# Patient Record
Sex: Male | Born: 1955 | Race: White | Hispanic: No | Smoking: Current every day smoker
Health system: Southern US, Community
[De-identification: ages and names within clinical notes are randomized; demographics above are authoritative.]

## PROBLEM LIST (undated history)

## (undated) DIAGNOSIS — E119 Type 2 diabetes mellitus without complications: Secondary | ICD-10-CM

## (undated) DIAGNOSIS — H269 Unspecified cataract: Secondary | ICD-10-CM

## (undated) DIAGNOSIS — C801 Malignant (primary) neoplasm, unspecified: Secondary | ICD-10-CM

## (undated) HISTORY — PX: MOHS SURGERY: SUR867

## (undated) HISTORY — PX: TONSILLECTOMY: SHX5217

## (undated) HISTORY — DX: Unspecified cataract: H26.9

## (undated) HISTORY — DX: Malignant (primary) neoplasm, unspecified: C80.1

## (undated) HISTORY — PX: APPENDECTOMY: SHX54

## (undated) HISTORY — DX: Type 2 diabetes mellitus without complications: E11.9

---

## 2001-12-01 ENCOUNTER — Encounter: Payer: Self-pay | Admitting: Emergency Medicine

## 2001-12-01 ENCOUNTER — Emergency Department (HOSPITAL_COMMUNITY): Admission: EM | Admit: 2001-12-01 | Discharge: 2001-12-01 | Payer: Self-pay | Admitting: Emergency Medicine

## 2019-11-20 ENCOUNTER — Other Ambulatory Visit: Payer: Self-pay

## 2019-11-21 ENCOUNTER — Encounter: Payer: Self-pay | Admitting: Family Medicine

## 2019-11-21 ENCOUNTER — Ambulatory Visit (INDEPENDENT_AMBULATORY_CARE_PROVIDER_SITE_OTHER): Payer: No Typology Code available for payment source | Admitting: Family Medicine

## 2019-11-21 VITALS — BP 146/86 | HR 94 | Temp 98.0°F | Ht 68.0 in | Wt 202.0 lb

## 2019-11-21 DIAGNOSIS — Z7689 Persons encountering health services in other specified circumstances: Secondary | ICD-10-CM | POA: Diagnosis not present

## 2019-11-21 DIAGNOSIS — R972 Elevated prostate specific antigen [PSA]: Secondary | ICD-10-CM

## 2019-11-21 DIAGNOSIS — R7301 Impaired fasting glucose: Secondary | ICD-10-CM

## 2019-11-21 DIAGNOSIS — E785 Hyperlipidemia, unspecified: Secondary | ICD-10-CM

## 2019-11-21 DIAGNOSIS — Z1283 Encounter for screening for malignant neoplasm of skin: Secondary | ICD-10-CM

## 2019-11-21 DIAGNOSIS — I10 Essential (primary) hypertension: Secondary | ICD-10-CM

## 2019-11-21 NOTE — Progress Notes (Signed)
Jesus Davenport is a 64 y.o. male  Chief Complaint  Patient presents with  . New Patient (Initial Visit)    Patient is here today to establish care. Had Colonoscopy 13-years-ago WNL and would like to do a Cologuard instead if possible. He has had 2 coffees with cream and sugar this am.  Does not need refills yet. Is a widower of 5 years. Brother died last week and sister died just before that. He is burying them this week.    HPI: Jesus Davenport is a 64 y.o. male here as a new patient to establish care with our office.  He has HTN, hypercholesterolemia, ? DM, elevated PSA. He was seeing PCP q65mo - [facility] Dr. Sherlene Shams  Last CPE, labs: about 6 mo ago  Last colonoscopy: 13 years ago, normal and due in 10 years; unsure if he wants to proceed with colonoscopy vs cologuard  Med refills needed today: none   History reviewed. No pertinent past medical history.  History reviewed. No pertinent surgical history.  Social History   Socioeconomic History  . Marital status: Widowed    Spouse name: Not on file  . Number of children: Not on file  . Years of education: Not on file  . Highest education level: Not on file  Occupational History  . Not on file  Tobacco Use  . Smoking status: Current Every Day Smoker    Types: Cigarettes  . Smokeless tobacco: Never Used  Substance and Sexual Activity  . Alcohol use: Yes    Comment: Occas  . Drug use: Never  . Sexual activity: Not Currently  Other Topics Concern  . Not on file  Social History Narrative  . Not on file   Social Determinants of Health   Financial Resource Strain:   . Difficulty of Paying Living Expenses:   Food Insecurity:   . Worried About Charity fundraiser in the Last Year:   . Arboriculturist in the Last Year:   Transportation Needs:   . Film/video editor (Medical):   Marland Kitchen Lack of Transportation (Non-Medical):   Physical Activity:   . Days of Exercise per Week:   . Minutes of Exercise  per Session:   Stress:   . Feeling of Stress :   Social Connections:   . Frequency of Communication with Friends and Family:   . Frequency of Social Gatherings with Friends and Family:   . Attends Religious Services:   . Active Member of Clubs or Organizations:   . Attends Archivist Meetings:   Marland Kitchen Marital Status:   Intimate Partner Violence:   . Fear of Current or Ex-Partner:   . Emotionally Abused:   Marland Kitchen Physically Abused:   . Sexually Abused:     History reviewed. No pertinent family history.   Immunization History  Administered Date(s) Administered  . Moderna SARS-COVID-2 Vaccination 09/26/2019, 10/25/2019    Outpatient Encounter Medications as of 11/21/2019  Medication Sig  . amLODipine (NORVASC) 5 MG tablet Take 5 mg by mouth daily.  Marland Kitchen atorvastatin (LIPITOR) 20 MG tablet Take 1 tablet by mouth daily.  Marland Kitchen dutasteride (AVODART) 0.5 MG capsule Take 1 capsule by mouth daily.  . hydrochlorothiazide (HYDRODIURIL) 25 MG tablet Take 1 tablet by mouth daily.  Marland Kitchen lisinopril (ZESTRIL) 40 MG tablet Take 1 tablet by mouth daily.   No facility-administered encounter medications on file as of 11/21/2019.     ROS: Gen: no fever, chills  Skin: no  rash, itching ENT: no ear pain, ear drainage, nasal congestion, rhinorrhea, sinus pressure, sore throat Resp: no cough, wheeze,SOB CV: no CP, palpitations, LE edema,  GI: no heartburn, n/v/d/c, abd pain GU: no dysuria, urgency, frequency, hematuria  MSK: no joint pain, myalgias, back pain Neuro: no dizziness, headache, weakness    No Known Allergies  BP (!) 146/86 (BP Location: Left Arm, Patient Position: Sitting, Cuff Size: Normal)   Pulse 94   Temp 98 F (36.7 C) (Oral)   Ht 5\' 8"  (1.727 m)   Wt 202 lb (91.6 kg)   SpO2 99%   BMI 30.71 kg/m    BP Readings from Last 3 Encounters:  11/21/19 (!) 146/86     Physical Exam  Constitutional: He is oriented to person, place, and time. He appears well-developed and  well-nourished. No distress.  Cardiovascular: Normal rate, regular rhythm and normal heart sounds.  Pulmonary/Chest: Effort normal and breath sounds normal. No respiratory distress. He has no wheezes.  Musculoskeletal:        General: No edema.  Neurological: He is alert and oriented to person, place, and time.     A/P:  1. Encounter to establish care with new doctor - due for labs - will RTO for fasting lab appt and shingrix #1 - due for CRC screening, pt will send MyChart message next week to let me know if he decides on colonoscopy vs cologuard - obtain records from previous PCP - see HPI  2. Essential hypertension - SBP slightly above goal today, will follow - cont current meds, low sodium diet, regular CV exercise - Basic metabolic panel; Future  3. Elevated PSA - PSA; Future  4. Hyperlipidemia, unspecified hyperlipidemia type - stable - cont statin - Lipid panel; Future - ALT; Future - AST; Future  5. IFG (impaired fasting glucose) - Hemoglobin A1c; Future - Microalbumin / creatinine urine ratio; Future  6. Screening for skin cancer - Ambulatory referral to Dermatology   This visit occurred during the SARS-CoV-2 public health emergency.  Safety protocols were in place, including screening questions prior to the visit, additional usage of staff PPE, and extensive cleaning of exam room while observing appropriate contact time as indicated for disinfecting solutions.

## 2019-11-22 ENCOUNTER — Encounter: Payer: Self-pay | Admitting: Family Medicine

## 2019-11-22 DIAGNOSIS — I10 Essential (primary) hypertension: Secondary | ICD-10-CM

## 2019-11-22 DIAGNOSIS — N400 Enlarged prostate without lower urinary tract symptoms: Secondary | ICD-10-CM | POA: Insufficient documentation

## 2019-11-22 DIAGNOSIS — C44622 Squamous cell carcinoma of skin of right upper limb, including shoulder: Secondary | ICD-10-CM | POA: Insufficient documentation

## 2019-11-22 DIAGNOSIS — M5136 Other intervertebral disc degeneration, lumbar region: Secondary | ICD-10-CM | POA: Insufficient documentation

## 2019-11-22 DIAGNOSIS — M47812 Spondylosis without myelopathy or radiculopathy, cervical region: Secondary | ICD-10-CM | POA: Insufficient documentation

## 2019-11-22 DIAGNOSIS — M51369 Other intervertebral disc degeneration, lumbar region without mention of lumbar back pain or lower extremity pain: Secondary | ICD-10-CM | POA: Insufficient documentation

## 2019-11-22 DIAGNOSIS — F17200 Nicotine dependence, unspecified, uncomplicated: Secondary | ICD-10-CM | POA: Insufficient documentation

## 2019-11-22 DIAGNOSIS — E1169 Type 2 diabetes mellitus with other specified complication: Secondary | ICD-10-CM | POA: Insufficient documentation

## 2019-11-22 HISTORY — DX: Essential (primary) hypertension: I10

## 2019-11-27 ENCOUNTER — Other Ambulatory Visit: Payer: No Typology Code available for payment source

## 2019-11-27 ENCOUNTER — Ambulatory Visit: Payer: No Typology Code available for payment source

## 2019-11-28 ENCOUNTER — Other Ambulatory Visit: Payer: Self-pay

## 2019-11-29 ENCOUNTER — Ambulatory Visit (INDEPENDENT_AMBULATORY_CARE_PROVIDER_SITE_OTHER): Payer: No Typology Code available for payment source

## 2019-11-29 ENCOUNTER — Other Ambulatory Visit (INDEPENDENT_AMBULATORY_CARE_PROVIDER_SITE_OTHER): Payer: No Typology Code available for payment source

## 2019-11-29 DIAGNOSIS — I10 Essential (primary) hypertension: Secondary | ICD-10-CM

## 2019-11-29 DIAGNOSIS — R7301 Impaired fasting glucose: Secondary | ICD-10-CM

## 2019-11-29 DIAGNOSIS — Z23 Encounter for immunization: Secondary | ICD-10-CM

## 2019-11-29 DIAGNOSIS — E785 Hyperlipidemia, unspecified: Secondary | ICD-10-CM

## 2019-11-29 DIAGNOSIS — R972 Elevated prostate specific antigen [PSA]: Secondary | ICD-10-CM

## 2019-11-29 LAB — BASIC METABOLIC PANEL
BUN: 9 mg/dL (ref 6–23)
CO2: 29 mEq/L (ref 19–32)
Calcium: 9 mg/dL (ref 8.4–10.5)
Chloride: 99 mEq/L (ref 96–112)
Creatinine, Ser: 0.8 mg/dL (ref 0.40–1.50)
GFR: 97.06 mL/min (ref >60.00)
Glucose, Bld: 145 mg/dL — ABNORMAL HIGH (ref 70–99)
Potassium: 3.8 mEq/L (ref 3.5–5.1)
Sodium: 132 mEq/L — ABNORMAL LOW (ref 135–145)

## 2019-11-29 LAB — MICROALBUMIN / CREATININE URINE RATIO
Creatinine,U: 22.5 mg/dL
Microalb Creat Ratio: 3.1 mg/g (ref 0.0–30.0)
Microalb, Ur: <0.7 mg/dL (ref 0.0–1.9)

## 2019-11-29 LAB — HEMOGLOBIN A1C: Hgb A1c MFr Bld: 7.1 % — ABNORMAL HIGH (ref 4.6–6.5)

## 2019-11-29 LAB — LIPID PANEL
Cholesterol: 152 mg/dL (ref 0–200)
HDL: 37.9 mg/dL — ABNORMAL LOW (ref >39.00)
NonHDL: 114.46
Total CHOL/HDL Ratio: 4
Triglycerides: 383 mg/dL — ABNORMAL HIGH (ref 0.0–149.0)
VLDL: 76.6 mg/dL — ABNORMAL HIGH (ref 0.0–40.0)

## 2019-11-29 LAB — PSA: PSA: 2.32 ng/mL (ref 0.10–4.00)

## 2019-11-29 LAB — LDL CHOLESTEROL, DIRECT: Direct LDL: 83 mg/dL

## 2019-11-29 LAB — ALT: ALT: 17 U/L (ref 0–53)

## 2019-11-29 LAB — AST: AST: 16 U/L (ref 0–37)

## 2019-11-29 NOTE — Progress Notes (Signed)
After obtaining consent, and per orders of Dr. Bryan Lemma, injection of Shingrix #1 given left deltoid by Akaysha Cobern Berneta Sages. Patient instructed to remain in clinic for 20 minutes afterwards, and to report any adverse reaction to me immediately.

## 2019-11-30 ENCOUNTER — Other Ambulatory Visit: Payer: No Typology Code available for payment source

## 2019-12-06 ENCOUNTER — Encounter: Payer: Self-pay | Admitting: Family Medicine

## 2019-12-07 NOTE — Telephone Encounter (Signed)
Please see message . Thank you .

## 2020-01-02 ENCOUNTER — Encounter: Payer: Self-pay | Admitting: Family Medicine

## 2020-01-02 NOTE — Telephone Encounter (Signed)
Please see message and advise.  Thank you. ° °

## 2020-01-03 ENCOUNTER — Encounter: Payer: Self-pay | Admitting: Nurse Practitioner

## 2020-01-03 ENCOUNTER — Other Ambulatory Visit: Payer: Self-pay

## 2020-01-03 ENCOUNTER — Ambulatory Visit (INDEPENDENT_AMBULATORY_CARE_PROVIDER_SITE_OTHER): Payer: Medicare Other | Admitting: Nurse Practitioner

## 2020-01-03 VITALS — BP 138/82 | HR 86 | Temp 97.6°F | Ht 68.0 in | Wt 206.2 lb

## 2020-01-03 DIAGNOSIS — M5441 Lumbago with sciatica, right side: Secondary | ICD-10-CM

## 2020-01-03 MED ORDER — PREDNISONE 20 MG PO TABS: ORAL_TABLET | ORAL | 0 refills | Status: AC

## 2020-01-03 MED ORDER — CYCLOBENZAPRINE HCL 5 MG PO TABS: 5.0000 mg | ORAL_TABLET | Freq: Every day | ORAL | 0 refills | Status: DC

## 2020-01-03 MED ORDER — KETOROLAC TROMETHAMINE 30 MG/ML IJ SOLN
30.0000 mg | Freq: Once | INTRAMUSCULAR | Status: AC
  Administered 2020-01-03: 30 mg via INTRAMUSCULAR

## 2020-01-03 MED ORDER — ACETAMINOPHEN 500 MG PO TABS: 500.0000 mg | ORAL_TABLET | Freq: Three times a day (TID) | ORAL | 0 refills | Status: DC | PRN

## 2020-01-03 NOTE — Progress Notes (Signed)
Subjective:  Patient ID: Jesus Davenport, male    DOB: 1956/07/02  Age: 64 y.o. MRN: [id number]  CC: Back Pain (right lower back hurts//pt stated he is traveling more with his job and he got home last Thursday and Friday an Sat he noticed back pain//this morning couldn't get out of the bed//pt took ibuprofen and didn't seem to help any)  Back Pain This is a recurrent problem. The current episode started in the past 7 days. The problem occurs constantly. The problem has been gradually worsening since onset. The pain is present in the lumbar spine and gluteal. The quality of the pain is described as aching and cramping. The pain radiates to the right thigh. The pain is moderate. The pain is worse during the day. The symptoms are aggravated by bending, sitting and twisting. Stiffness is present in the morning. Pertinent negatives include no abdominal pain, bladder incontinence, bowel incontinence, dysuria, numbness, paresis, paresthesias, perianal numbness, tingling or weakness. Risk factors include lack of exercise, obesity, poor posture and sedentary lifestyle. He has tried NSAIDs for the symptoms. The treatment provided no relief.   Reviewed past Medical, Social and Family history today.  Outpatient Medications Prior to Visit  Medication Sig Dispense Refill  . amLODipine (NORVASC) 5 MG tablet Take 5 mg by mouth daily.    Marland Kitchen atorvastatin (LIPITOR) 20 MG tablet Take 1 tablet by mouth daily.    . hydrochlorothiazide (HYDRODIURIL) 25 MG tablet Take 1 tablet by mouth daily.    Marland Kitchen lisinopril (ZESTRIL) 40 MG tablet Take 1 tablet by mouth daily.    Marland Kitchen dutasteride (AVODART) 0.5 MG capsule Take 1 capsule by mouth daily.     No facility-administered medications prior to visit.    ROS See HPI  Objective:  BP 138/82   Pulse 86   Temp 97.6 F (36.4 C) (Tympanic)   Ht 5\' 8"  (1.727 m)   Wt 206 lb 3.2 oz (93.5 kg)   SpO2 95%   BMI 31.35 kg/m   BP Readings from Last 3 Encounters:  01/03/20 138/82   11/21/19 (!) 146/86    Wt Readings from Last 3 Encounters:  01/03/20 206 lb 3.2 oz (93.5 kg)  11/21/19 202 lb (91.6 kg)    Physical Exam Vitals reviewed.  Cardiovascular:     Rate and Rhythm: Normal rate.     Pulses: Normal pulses.  Pulmonary:     Effort: Pulmonary effort is normal.  Abdominal:     Palpations: Abdomen is soft.     Tenderness: There is no abdominal tenderness. There is no right CVA tenderness, left CVA tenderness or guarding.  Musculoskeletal:        General: Normal range of motion.     Right hip: Normal.     Left hip: Normal.     Right upper leg: Normal.     Left upper leg: Normal.     Right knee: Normal.     Left knee: Normal.     Right lower leg: Normal. No edema.     Left lower leg: Normal. No edema.     Comments: negative SLR  Neurological:     Mental Status: He is alert and oriented to person, place, and time.    Lab Results  Component Value Date   GLUCOSE 145 (H) 11/29/2019   CHOL 152 11/29/2019   TRIG 383.0 (H) 11/29/2019   HDL 37.90 (L) 11/29/2019   LDLDIRECT 83.0 11/29/2019   ALT 17 11/29/2019   AST 16 11/29/2019  NA 132 (L) 11/29/2019   K 3.8 11/29/2019   CL 99 11/29/2019   CREATININE 0.80 11/29/2019   BUN 9 11/29/2019   CO2 29 11/29/2019   PSA 2.32 11/29/2019   HGBA1C 7.1 (H) 11/29/2019   MICROALBUR <0.7 11/29/2019   Assessment & Plan:  This visit occurred during the SARS-CoV-2 public health emergency.  Safety protocols were in place, including screening questions prior to the visit, additional usage of staff PPE, and extensive cleaning of exam room while observing appropriate contact time as indicated for disinfecting solutions.   Daeron was seen today for back pain.  Diagnoses and all orders for this visit:  Acute right-sided low back pain with right-sided sciatica -     ketorolac (TORADOL) 30 MG/ML injection 30 mg -     predniSONE (DELTASONE) 20 MG tablet; Take 2 tablets (40 mg total) by mouth daily with breakfast for 1  day, THEN 1.5 tablets (30 mg total) daily with breakfast for 1 day, THEN 1 tablet (20 mg total) daily with breakfast for 1 day, THEN 0.5 tablets (10 mg total) daily with breakfast for 1 day. -     cyclobenzaprine (FLEXERIL) 5 MG tablet; Take 1-2 tablets (5-10 mg total) by mouth at bedtime. -     acetaminophen (TYLENOL) 500 MG tablet; Take 1 tablet (500 mg total) by mouth every 8 (eight) hours as needed.   I am having Loys Shugars. Wojtas "Norman" start on predniSONE, cyclobenzaprine, and acetaminophen. I am also having him maintain his amLODipine, atorvastatin, dutasteride, hydrochlorothiazide, and lisinopril. We administered ketorolac.  Meds ordered this encounter  Medications  . ketorolac (TORADOL) 30 MG/ML injection 30 mg  . predniSONE (DELTASONE) 20 MG tablet    Sig: Take 2 tablets (40 mg total) by mouth daily with breakfast for 1 day, THEN 1.5 tablets (30 mg total) daily with breakfast for 1 day, THEN 1 tablet (20 mg total) daily with breakfast for 1 day, THEN 0.5 tablets (10 mg total) daily with breakfast for 1 day.    Dispense:  5 tablet    Refill:  0    Order Specific Question:   Supervising Provider    Answer:   Ronnald Nian [[id number]]  . cyclobenzaprine (FLEXERIL) 5 MG tablet    Sig: Take 1-2 tablets (5-10 mg total) by mouth at bedtime.    Dispense:  14 tablet    Refill:  0    Order Specific Question:   Supervising Provider    Answer:   Ronnald Nian [[id number]]  . acetaminophen (TYLENOL) 500 MG tablet    Sig: Take 1 tablet (500 mg total) by mouth every 8 (eight) hours as needed.    Dispense:  30 tablet    Refill:  0    Order Specific Question:   Supervising Provider    Answer:   Ronnald Nian [[id number]]    Problem List Items Addressed This Visit    None    Visit Diagnoses    Acute right-sided low back pain with right-sided sciatica    -  Primary   Relevant Medications   ketorolac (TORADOL) 30 MG/ML injection 30 mg (Completed)   predniSONE (DELTASONE) 20 MG tablet    cyclobenzaprine (FLEXERIL) 5 MG tablet   acetaminophen (TYLENOL) 500 MG tablet      Follow-up: No follow-ups on file.  [name], NP

## 2020-01-03 NOTE — Patient Instructions (Addendum)
Do not take flexeril and drive.  Acute Back Pain, Adult Acute back pain is sudden and usually short-lived. It is often caused by an injury to the muscles and tissues in the back. The injury may result from:  A muscle or ligament getting overstretched or torn (strained). Ligaments are tissues that connect bones to each other. Lifting something improperly can cause a back strain.  Wear and tear (degeneration) of the spinal disks. Spinal disks are circular tissue that provides cushioning between the bones of the spine (vertebrae).  Twisting motions, such as while playing sports or doing yard work.  A hit to the back.  Arthritis. You may have a physical exam, lab tests, and imaging tests to find the cause of your pain. Acute back pain usually goes away with rest and home care. Follow these instructions at home: Managing pain, stiffness, and swelling  Take over-the-counter and prescription medicines only as told by your health care provider.  Your health care provider may recommend applying ice during the first 24-48 hours after your pain starts. To do this: ? Put ice in a plastic bag. ? Place a towel between your skin and the bag. ? Leave the ice on for 20 minutes, 2-3 times a day.  If directed, apply heat to the affected area as often as told by your health care provider. Use the heat source that your health care provider recommends, such as a moist heat pack or a heating pad. ? Place a towel between your skin and the heat source. ? Leave the heat on for 20-30 minutes. ? Remove the heat if your skin turns bright red. This is especially important if you are unable to feel pain, heat, or cold. You have a greater risk of getting burned. Activity   Do not stay in bed. Staying in bed for more than 1-2 days can delay your recovery.  Sit up and stand up straight. Avoid leaning forward when you sit, or hunching over when you stand. ? If you work at a desk, sit close to it so you do not need  to lean over. Keep your chin tucked in. Keep your neck drawn back, and keep your elbows bent at a right angle. Your arms should look like the letter "L." ? Sit high and close to the steering wheel when you drive. Add lower back (lumbar) support to your car seat, if needed.  Take short walks on even surfaces as soon as you are able. Try to increase the length of time you walk each day.  Do not sit, drive, or stand in one place for more than 30 minutes at a time. Sitting or standing for long periods of time can put stress on your back.  Do not drive or use heavy machinery while taking prescription pain medicine.  Use proper lifting techniques. When you bend and lift, use positions that put less stress on your back: ? Fouke your knees. ? Keep the load close to your body. ? Avoid twisting.  Exercise regularly as told by your health care provider. Exercising helps your back heal faster and helps prevent back injuries by keeping muscles strong and flexible.  Work with a physical therapist to make a safe exercise program, as recommended by your health care provider. Do any exercises as told by your physical therapist. Lifestyle  Maintain a healthy weight. Extra weight puts stress on your back and makes it difficult to have good posture.  Avoid activities or situations that make you feel anxious  or stressed. Stress and anxiety increase muscle tension and can make back pain worse. Learn ways to manage anxiety and stress, such as through exercise. General instructions  Sleep on a firm mattress in a comfortable position. Try lying on your side with your knees slightly bent. If you lie on your back, put a pillow under your knees.  Follow your treatment plan as told by your health care provider. This may include: ? Cognitive or behavioral therapy. ? Acupuncture or massage therapy. ? Meditation or yoga. Contact a health care provider if:  You have pain that is not relieved with rest or  medicine.  You have increasing pain going down into your legs or buttocks.  Your pain does not improve after 2 weeks.  You have pain at night.  You lose weight without trying.  You have a fever or chills. Get help right away if:  You develop new bowel or bladder control problems.  You have unusual weakness or numbness in your arms or legs.  You develop nausea or vomiting.  You develop abdominal pain.  You feel faint. Summary  Acute back pain is sudden and usually short-lived.  Use proper lifting techniques. When you bend and lift, use positions that put less stress on your back.  Take over-the-counter and prescription medicines and apply heat or ice as directed by your health care provider. This information is not intended to replace advice given to you by your health care provider. Make sure you discuss any questions you have with your health care provider. Document Revised: 10/10/2018 Document Reviewed: 02/02/2017 Elsevier Patient Education  [place].

## 2020-02-18 ENCOUNTER — Encounter: Payer: Self-pay | Admitting: Nurse Practitioner

## 2020-02-18 ENCOUNTER — Other Ambulatory Visit: Payer: Self-pay

## 2020-02-18 ENCOUNTER — Ambulatory Visit (INDEPENDENT_AMBULATORY_CARE_PROVIDER_SITE_OTHER): Payer: Medicare Other | Admitting: Nurse Practitioner

## 2020-02-18 ENCOUNTER — Ambulatory Visit (INDEPENDENT_AMBULATORY_CARE_PROVIDER_SITE_OTHER): Payer: Medicare Other

## 2020-02-18 VITALS — BP 136/84 | HR 76 | Temp 97.9°F | Ht 68.0 in | Wt 205.0 lb

## 2020-02-18 DIAGNOSIS — M5441 Lumbago with sciatica, right side: Secondary | ICD-10-CM

## 2020-02-18 DIAGNOSIS — M5136 Other intervertebral disc degeneration, lumbar region: Secondary | ICD-10-CM

## 2020-02-18 DIAGNOSIS — G8929 Other chronic pain: Secondary | ICD-10-CM

## 2020-02-18 IMAGING — DX DG LUMBAR SPINE COMPLETE 4+V
5 series · 5 of 5 positions shown · non-contrast
Comparison: None.

CLINICAL DATA: Chronic low back pain radiating into the right leg.
No known injury.

EXAM:
LUMBAR SPINE - COMPLETE 4+ VIEW

[lumbar spine ap]
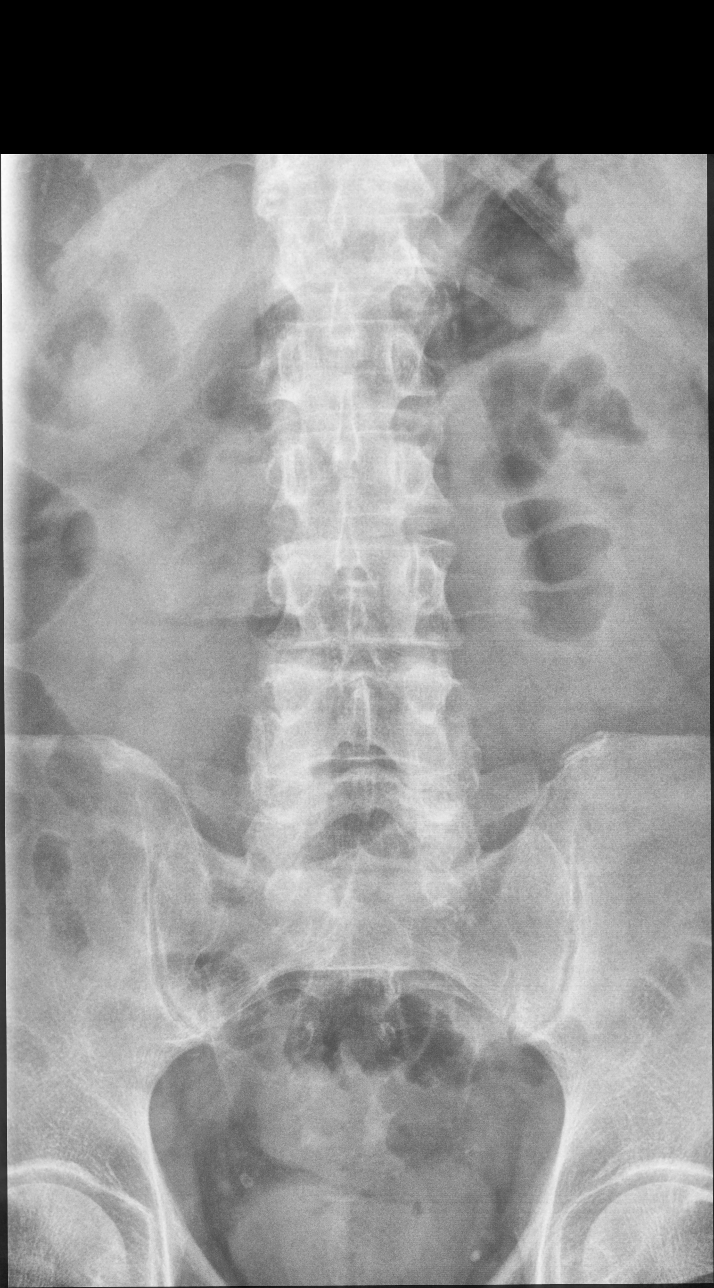

[lumbar spine lmo (1 of 2)]
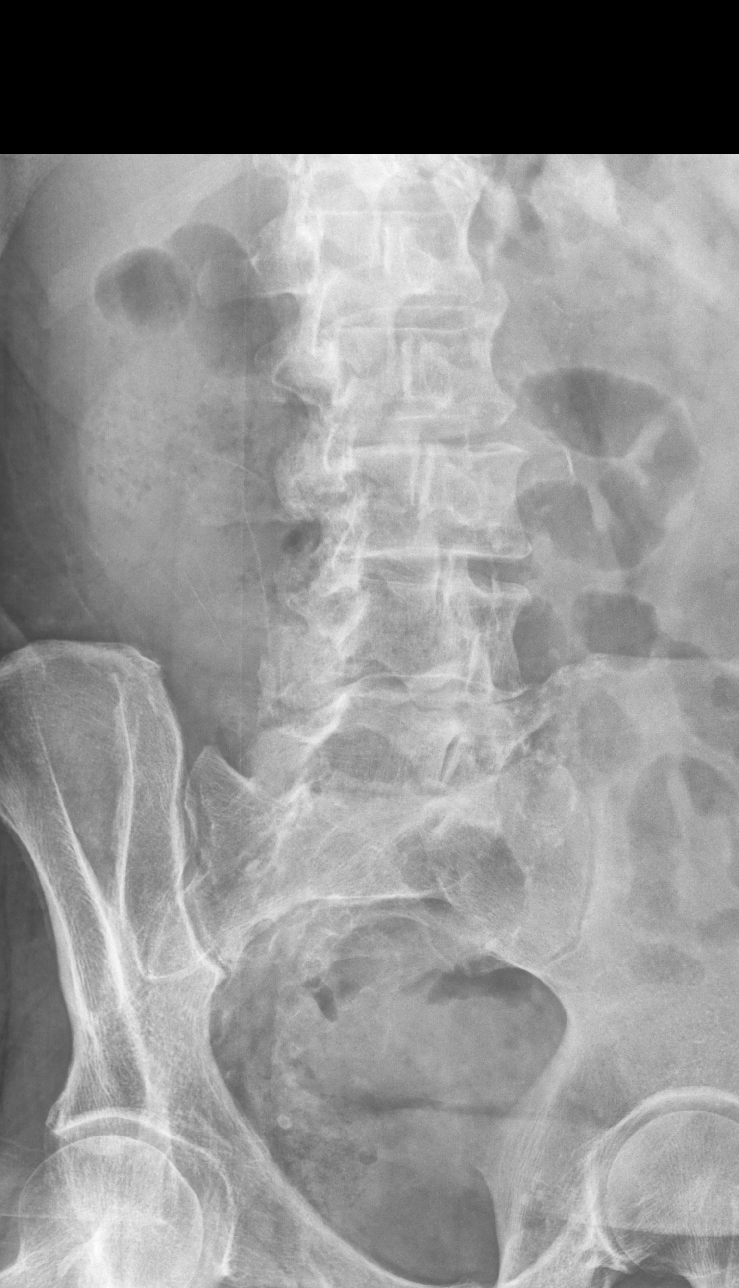

[lumbar spine lmo (2 of 2)]
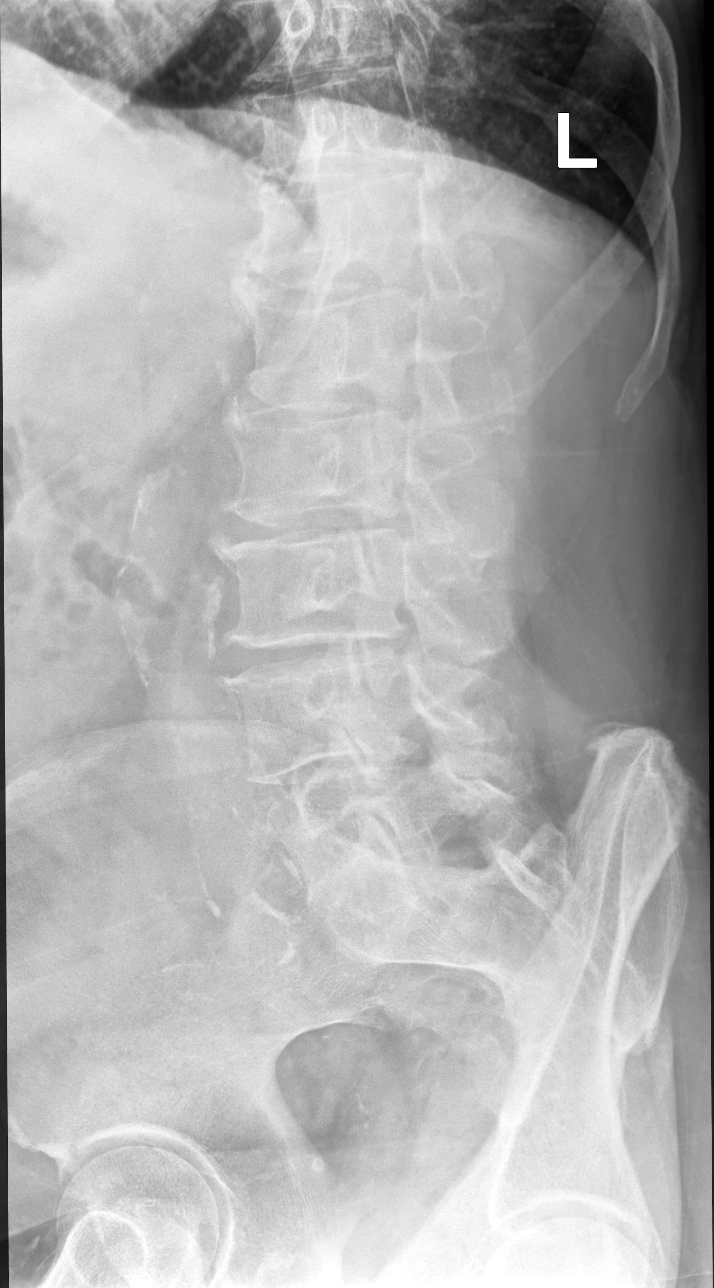

[lumbar spine lat (1 of 2)]
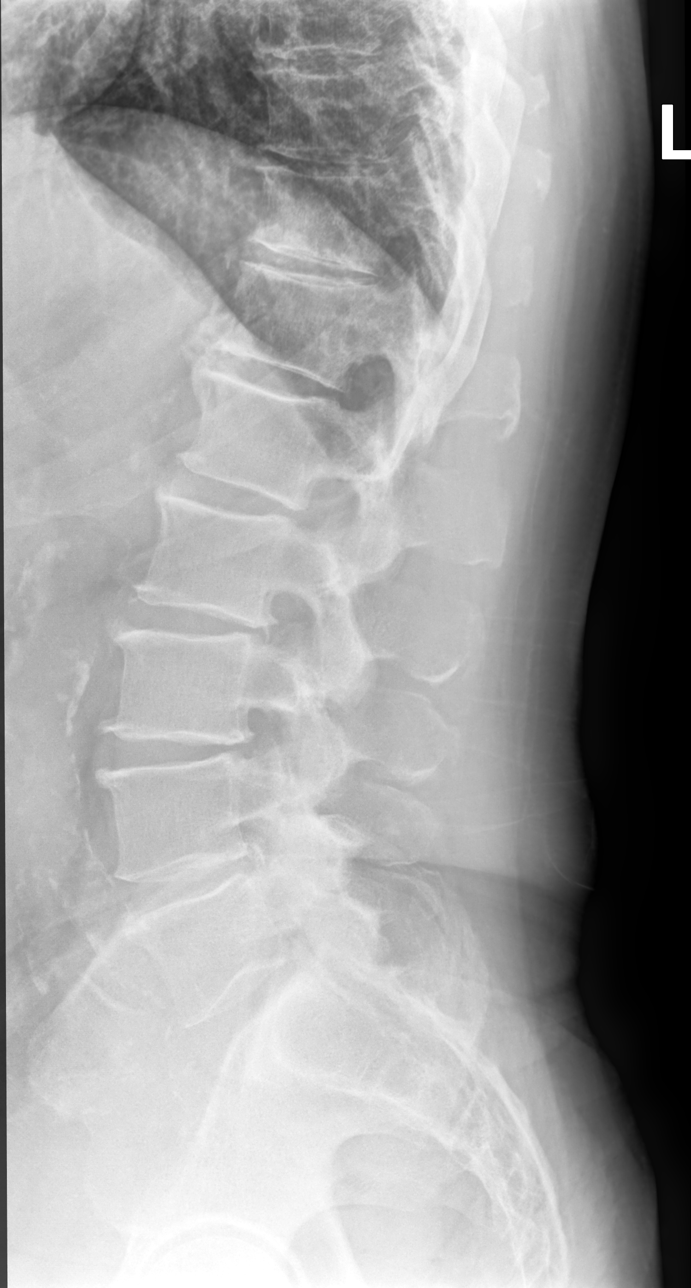

[lumbar spine lat (2 of 2)]
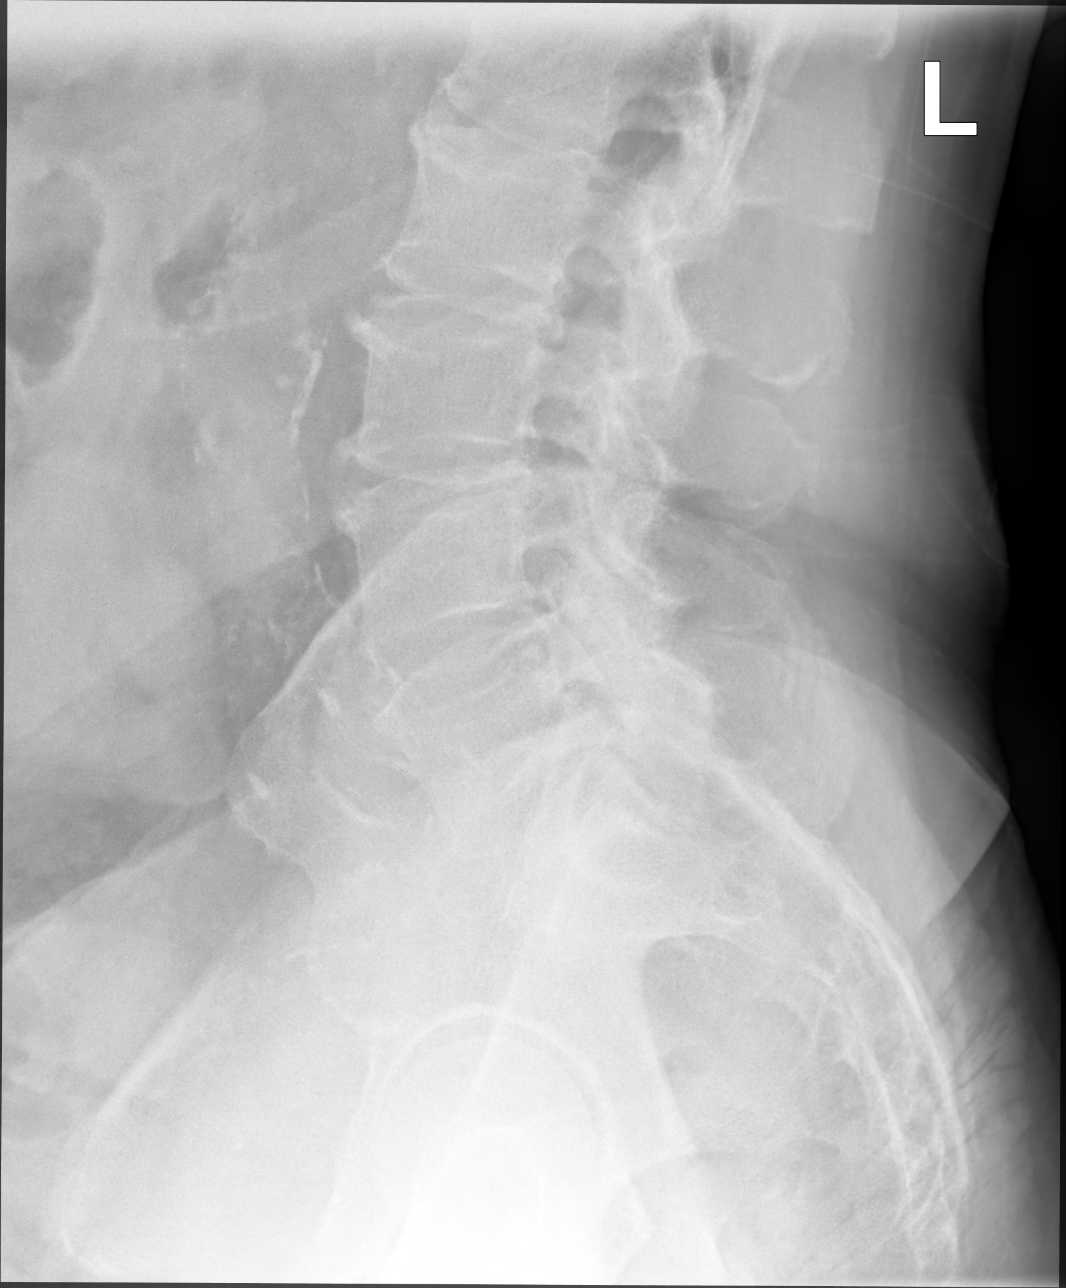

[5 of 5 positions shown; findings below may reference images not displayed]

FINDINGS: Vertebral body height and alignment are maintained. Mild loss of
disc space height is seen at L2-3 and L3-4 and there is scattered
mild endplate spurring.
IMPRESSION: Mild appearing degenerative disc disease.

Aortic Atherosclerosis ([W4]-[W4]).

## 2020-02-18 MED ORDER — METHYLPREDNISOLONE ACETATE 40 MG/ML IJ SUSP: 40.0000 mg | Freq: Once | INTRAMUSCULAR | Status: DC

## 2020-02-18 MED ORDER — METHYLPREDNISOLONE ACETATE 80 MG/ML IJ SUSP
80.0000 mg | Freq: Once | INTRAMUSCULAR | Status: AC
  Administered 2020-02-18: 40 mg via INTRAMUSCULAR

## 2020-02-18 MED ORDER — CYCLOBENZAPRINE HCL 5 MG PO TABS: 5.0000 mg | ORAL_TABLET | Freq: Every day | ORAL | 0 refills | Status: DC

## 2020-02-18 NOTE — Assessment & Plan Note (Signed)
Chronic, recurrent lower back pain with right radicular pain to thigh and calf. Negative SLR, no change in GU/GI functions , no saddle paresthesia. Triggered by prolonged sitting and walking Had significant improvement with toradol IM, oral prednisone and muscle relaxant. Denies any recent fall or lifting or pushing any heavy object.  Obtain lumbar x-ray Administer depo IM Start muscle relaxant. Consider referral for outpatient PT if normal x-ray.

## 2020-02-18 NOTE — Progress Notes (Signed)
Subjective:  Patient ID: Jesus Davenport, male    DOB: Nov 19, 1955  Age: 64 y.o. MRN: [id number]  CC: Back Pain (Back pain x1 month, pain went away for a few weeks but returned Friday 02/15/20 and has been very painful, waking him out of his sleep.  )  HPI  DDD (degenerative disc disease), lumbar Chronic, recurrent lower back pain with right radicular pain to thigh and calf. Negative SLR, no change in GU/GI functions , no saddle paresthesia. Triggered by prolonged sitting and walking Had significant improvement with toradol IM, oral prednisone and muscle relaxant. Denies any recent fall or lifting or pushing any heavy object.  Obtain lumbar x-ray Administer depo IM Start muscle relaxant. Consider referral for outpatient PT if normal x-ray.   Reviewed past Medical, Social and Family history today.  Outpatient Medications Prior to Visit  Medication Sig Dispense Refill  . acetaminophen (TYLENOL) 500 MG tablet Take 1 tablet (500 mg total) by mouth every 8 (eight) hours as needed. 30 tablet 0  . amLODipine (NORVASC) 5 MG tablet Take 5 mg by mouth daily.    Marland Kitchen atorvastatin (LIPITOR) 20 MG tablet Take 1 tablet by mouth daily.    Marland Kitchen dutasteride (AVODART) 0.5 MG capsule Take 1 capsule by mouth daily.    . hydrochlorothiazide (HYDRODIURIL) 25 MG tablet Take 1 tablet by mouth daily.    Marland Kitchen lisinopril (ZESTRIL) 40 MG tablet Take 1 tablet by mouth daily.    . cyclobenzaprine (FLEXERIL) 5 MG tablet Take 1-2 tablets (5-10 mg total) by mouth at bedtime. (Patient not taking: Reported on 02/18/2020) 14 tablet 0   No facility-administered medications prior to visit.    ROS See HPI  Objective:  BP 136/84 (BP Location: Left Arm, Patient Position: Sitting, Cuff Size: Normal)   Pulse 76   Temp 97.9 F (36.6 C) (Temporal)   Ht 5\' 8"  (1.727 m)   Wt 205 lb (93 kg)   SpO2 98%   BMI 31.17 kg/m   Physical Exam Vitals reviewed.  Constitutional:      Appearance: He is obese.  Cardiovascular:      Rate and Rhythm: Normal rate.     Pulses: Normal pulses.  Musculoskeletal:     Lumbar back: Normal. Normal range of motion. Negative right straight leg raise test and negative left straight leg raise test.     Right hip: Normal.     Right upper leg: Normal.     Right knee: Normal.     Right lower leg: Normal. No edema.     Left lower leg: No edema.       Legs:  Skin:    General: Skin is warm and dry.     Findings: No erythema or rash.  Neurological:     Mental Status: He is alert and oriented to person, place, and time.     Assessment & Plan:  This visit occurred during the SARS-CoV-2 public health emergency.  Safety protocols were in place, including screening questions prior to the visit, additional usage of staff PPE, and extensive cleaning of exam room while observing appropriate contact time as indicated for disinfecting solutions.   Jesus Davenport was seen today for back pain.  Diagnoses and all orders for this visit:  DDD (degenerative disc disease), lumbar -     Discontinue: methylPREDNISolone acetate (DEPO-MEDROL) injection 40 mg -     DG Lumbar Spine Complete -     predniSONE (STERAPRED UNI-PAK 21 TAB) 10 MG (21) TBPK tablet; As directed  on package  Chronic right-sided low back pain with right-sided sciatica -     Discontinue: methylPREDNISolone acetate (DEPO-MEDROL) injection 40 mg -     DG Lumbar Spine Complete -     cyclobenzaprine (FLEXERIL) 5 MG tablet; Take 1-2 tablets (5-10 mg total) by mouth at bedtime. -     methylPREDNISolone acetate (DEPO-MEDROL) injection 80 mg -     predniSONE (STERAPRED UNI-PAK 21 TAB) 10 MG (21) TBPK tablet; As directed on package    Problem List Items Addressed This Visit      Musculoskeletal and Integument   DDD (degenerative disc disease), lumbar - Primary    Chronic, recurrent lower back pain with right radicular pain to thigh and calf. Negative SLR, no change in GU/GI functions , no saddle paresthesia. Triggered by prolonged sitting  and walking Had significant improvement with toradol IM, oral prednisone and muscle relaxant. Denies any recent fall or lifting or pushing any heavy object.  Obtain lumbar x-ray Administer depo IM Start muscle relaxant. Consider referral for outpatient PT if normal x-ray.       Relevant Medications   cyclobenzaprine (FLEXERIL) 5 MG tablet   predniSONE (STERAPRED UNI-PAK 21 TAB) 10 MG (21) TBPK tablet   Other Relevant Orders   DG Lumbar Spine Complete (Completed)    Other Visit Diagnoses    Chronic right-sided low back pain with right-sided sciatica       Relevant Medications   cyclobenzaprine (FLEXERIL) 5 MG tablet   methylPREDNISolone acetate (DEPO-MEDROL) injection 80 mg (Completed)   predniSONE (STERAPRED UNI-PAK 21 TAB) 10 MG (21) TBPK tablet   Other Relevant Orders   DG Lumbar Spine Complete (Completed)      Follow-up: No follow-ups on file.  Jesus Lacy, NP

## 2020-02-18 NOTE — Patient Instructions (Signed)
Go to lab for x-ray You will be called with results. Your son will need to wait 90days prior to getting COVID vaccine.

## 2020-02-19 ENCOUNTER — Telehealth: Payer: Self-pay | Admitting: Family Medicine

## 2020-02-19 NOTE — Telephone Encounter (Signed)
Patient is calling and stated that he received a shot yesterday and wanted to see how long it took to be effective, please advise. CB [phone]

## 2020-02-20 ENCOUNTER — Encounter: Payer: Self-pay | Admitting: Nurse Practitioner

## 2020-02-20 ENCOUNTER — Telehealth: Payer: Self-pay | Admitting: Family Medicine

## 2020-02-20 ENCOUNTER — Telehealth: Payer: Self-pay | Admitting: Nurse Practitioner

## 2020-02-20 DIAGNOSIS — M5136 Other intervertebral disc degeneration, lumbar region: Secondary | ICD-10-CM

## 2020-02-20 DIAGNOSIS — G8929 Other chronic pain: Secondary | ICD-10-CM

## 2020-02-20 MED ORDER — PREDNISONE 10 MG (21) PO TBPK: ORAL_TABLET | ORAL | 0 refills | Status: DC

## 2020-02-20 NOTE — Telephone Encounter (Signed)
Dr. Loletha Grayer please advise.  Pt called in an was wondering if in absence of Baldo Ash leaving early if you could send in the oral prednisone he said she recommended on 8/16 today since shes not here. He received depo on 02/18/2020 with no relief.

## 2020-02-20 NOTE — Telephone Encounter (Signed)
Caller Name: Laurey Arrow Call back phone #: [phone]  Pt notes that he has not felt relief from the toradol injection. Maybe a mild decrease. He said that he can't walk for long from the pain.   He saw the xray results in [place] but no report from the provider on how to proceed or what they mean. [place] leaves early today and pt wanting a response today. Pt requesting Dr. Loletha Grayer to advise.

## 2020-02-20 NOTE — Telephone Encounter (Signed)
Referral entered  

## 2020-02-20 NOTE — Telephone Encounter (Signed)
Prednisone was sent to pharmacy earlier today by [facility] and referral was placed to ortho as well

## 2020-02-20 NOTE — Telephone Encounter (Signed)
Patient recently had acute appointment with Baldo Ash who recommended he start oral prednisone, continue muscle relaxant and have referral to Ortho. Patient called to say he wants to do all those.  He requested Dr. Bryan Lemma call in the medications today for him to pick up asap.

## 2020-02-20 NOTE — Telephone Encounter (Signed)
-----   Message from Arcelia Jew, [place] sent at 02/20/2020  3:26 PM EDT ----- Patient notified and verbalized understanding, patient states he is also okay with referral.

## 2020-02-26 ENCOUNTER — Ambulatory Visit (INDEPENDENT_AMBULATORY_CARE_PROVIDER_SITE_OTHER): Payer: Medicare Other

## 2020-02-26 ENCOUNTER — Telehealth: Payer: Self-pay | Admitting: Family Medicine

## 2020-02-26 ENCOUNTER — Ambulatory Visit (INDEPENDENT_AMBULATORY_CARE_PROVIDER_SITE_OTHER): Payer: Medicare Other | Admitting: Specialist

## 2020-02-26 ENCOUNTER — Encounter: Payer: Self-pay | Admitting: Specialist

## 2020-02-26 ENCOUNTER — Other Ambulatory Visit: Payer: Self-pay

## 2020-02-26 ENCOUNTER — Telehealth: Payer: Self-pay

## 2020-02-26 VITALS — BP 151/87 | HR 73 | Ht 68.0 in | Wt 205.0 lb

## 2020-02-26 DIAGNOSIS — M5136 Other intervertebral disc degeneration, lumbar region: Secondary | ICD-10-CM

## 2020-02-26 DIAGNOSIS — M4807 Spinal stenosis, lumbosacral region: Secondary | ICD-10-CM | POA: Diagnosis not present

## 2020-02-26 MED ORDER — GABAPENTIN 100 MG PO CAPS: 100.0000 mg | ORAL_CAPSULE | Freq: Every day | ORAL | 3 refills | Status: DC

## 2020-02-26 MED ORDER — DICLOFENAC SODIUM 50 MG PO TBEC: 50.0000 mg | DELAYED_RELEASE_TABLET | Freq: Every day | ORAL | 6 refills | Status: DC

## 2020-02-26 NOTE — Telephone Encounter (Signed)
Signed and completed handicap placard was left up front and pt was notified and stated he was on his way to receive these. They are in a big brown envelope an no charge.

## 2020-02-26 NOTE — Telephone Encounter (Signed)
Forms were completed by Baldo Ash an left up front for the patient to pick up and he was notified they were ready.

## 2020-02-26 NOTE — Telephone Encounter (Signed)
Patient dropped of Disability Parking form to be completed by his provider. He wants a permanent one instead of a temporary one (if possible). Forms placed in providers folder up front.

## 2020-02-26 NOTE — Patient Instructions (Signed)
Avoid bending, stooping and avoid lifting weights greater than 10 lbs. Avoid prolong standing and walking. Avoid frequent bending and stooping  No lifting greater than 10 lbs. May use ice or moist heat for pain. Weight loss is of benefit. Handicap license is approved. MRI lumbar spine to assess for right L4 or L5  nerve root compression due to stenosis. Start diclofenac 50 mg po daily may increase to twice a day with meal or snack. Start gabapentin 100 mg po at night.

## 2020-02-26 NOTE — Progress Notes (Signed)
Office Visit Note   Patient: Jesus Davenport           Date of Birth: Nov 05, 1955           MRN: [id number] Visit Date: 02/26/2020              Requested by: Flossie Buffy, NP [place],  [place] [address] PCP: Ronnald Nian, DO   Assessment & Plan: Visit Diagnoses:  1. DDD (degenerative disc disease), lumbar   2. Other intervertebral disc degeneration, lumbar region   3. Spinal stenosis of lumbosacral region     Plan: Avoid bending, stooping and avoid lifting weights greater than 10 lbs. Avoid prolong standing and walking. Avoid frequent bending and stooping  No lifting greater than 10 lbs. May use ice or moist heat for pain. Weight loss is of benefit. Handicap license is approved. MRI lumbar spine to assess for right L4 or L5  nerve root compression due to stenosis. Start diclofenac 50 mg po daily may increase to twice a day with meal or snack. Start gabapentin 100 mg po at night.  Follow-Up Instructions: Return in about 3 weeks (around 03/18/2020).   Orders:  Orders Placed This Encounter  Procedures  . XR Lumb Spine Flex&Ext Only   No orders of the defined types were placed in this encounter.     Procedures: No procedures performed   Clinical Data: No additional findings.   Subjective: Chief Complaint  Patient presents with  . Lower Back - Pain  . Right Leg - Pain    64 year old male with history of back pain with radiation into the right lateral thigh and calf. 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 [place] and [place],  [place] and [place]. He handles 1/2 of [place]. Not much travel last year in a half. Took a trip to [place] and then the next week to [place] and the back started up again. 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 Has moved top [place] to be close to family. Told in  [place] that it  was DDD and arthritis conditions. No bowel or bladder difficulty, enuresis at night and this has been for a few years and has urgency.The pain in the back is worse with lying down, hard to get comfortable with moving the leg. Standing in one place he feels the pain. Walking can increase the pain. Standing he will take the weight off the leg and it will not always help to relieve the pain. No difficulty with reaching the shoes and socks. Sometimes leaning forward and bending at the waist by the time he reaches the floor he will feel relief.  He  Was able to walk prior to moving here, but over the last 2-3 months he is now not able to walk a distance of 1 mile. Sometimes the pain is  Severe and he can not walk through the grocery store. After the first round of prednisone and a steroid shot in the left should the pain did  Improved but eventually the pain recurred. Leans on carts while shopping. There is no numbness but an aching pain into the right lateral buttock and hip area.  No numbness in the toes of bottom of the right foot. He smokes about 1 ppd or less for 30 years. He has stopped yard work some times ago. The more he  walks the more he feels it. He sleeps on his side some times the pain is worst when he awakes. Scale of 1-10 the pain sitting is about a 2. With standing the pain with weight bearing is about but is as high and he hobbles, hard to put number but maybe "7".   Review of Systems  Constitutional: Positive for activity change and unexpected weight change (lost some on purpose). Negative for appetite change, chills, diaphoresis, fatigue and fever.  HENT: Positive for dental problem. Negative for congestion, drooling, ear discharge, ear pain, facial swelling, hearing loss, mouth sores, nosebleeds, postnasal drip, rhinorrhea, sinus pressure, sinus pain, sneezing, sore throat, tinnitus, trouble swallowing and voice change.   Eyes: Negative.  Negative for photophobia, pain, discharge, redness,  itching and visual disturbance (change in eye with cataracts both eyes).  Respiratory: Positive for cough. Negative for apnea, choking, chest tightness, shortness of breath, wheezing and stridor.   Cardiovascular: Negative.  Negative for chest pain, palpitations and leg swelling.  Gastrointestinal: Negative.  Negative for abdominal distention, abdominal pain, anal bleeding, blood in stool, constipation, diarrhea, nausea, rectal pain and vomiting.  Endocrine: Negative for cold intolerance, heat intolerance, polydipsia, polyphagia and polyuria.  Genitourinary: Negative for difficulty urinating, dysuria, enuresis, flank pain, frequency, genital sores, hematuria and urgency.  Musculoskeletal: Positive for arthralgias and back pain. Negative for gait problem, joint swelling, myalgias, neck pain and neck stiffness.  Skin: Negative.  Negative for color change, pallor, rash and wound.  Allergic/Immunologic: Negative.  Negative for environmental allergies, food allergies and immunocompromised state.  Neurological: Negative.  Negative for dizziness, tremors, seizures, syncope, facial asymmetry, speech difficulty, weakness, light-headedness, numbness and headaches.  Hematological: Negative.  Negative for adenopathy. Does not bruise/bleed easily.  Psychiatric/Behavioral: Negative for agitation, behavioral problems, confusion, decreased concentration, dysphoric mood, hallucinations, self-injury, sleep disturbance and suicidal ideas. The patient is not nervous/anxious and is not hyperactive.      Objective: Vital Signs: BP (!) 151/87 (BP Location: Left Arm, Patient Position: Sitting)   Pulse 73   Ht 5\' 8"  (1.727 m)   Wt 205 lb (93 kg)   BMI 31.17 kg/m   Physical Exam  Ortho Exam  Specialty Comments:  No specialty comments available.  Imaging: XR Lumb Spine Flex&Ext Only  Result Date: 02/26/2020 Ap and lateral flexion and extension radiographs show DDD L1-2 through L4-5 with retrolisthesis L1-2 and  L2-3. There is narrowing L4-5 foramen on lateral radiographs.    PMFS History: Patient Active Problem List   Diagnosis Date Noted  . Tobacco use disorder 11/22/2019  . DM type 2 with diabetic mixed hyperlipidemia ([place]) 11/22/2019  . Benign essential HTN 11/22/2019  . BPH with elevated PSA 11/22/2019  . Squamous cell carcinoma of arm, 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  . Cervical arthritis 11/22/2019  . DDD (degenerative disc disease), lumbar 11/22/2019   History reviewed. No pertinent past medical history.  History reviewed. No pertinent family history.  Past Surgical History:  Procedure Laterality Date  . APPENDECTOMY     64yo  . MOHS SURGERY     SCC Rt arm  . TONSILLECTOMY     Social History   Occupational History  . Not on file  Tobacco Use  . Smoking status: Current Every Day Smoker    Types: Cigarettes  . Smokeless tobacco: Never Used  Substance and Sexual Activity  . Alcohol use: Yes    Comment: Occas  . Drug use: Never  . Sexual activity: Not Currently

## 2020-02-27 ENCOUNTER — Ambulatory Visit: Payer: No Typology Code available for payment source | Admitting: Physician Assistant

## 2020-03-19 ENCOUNTER — Ambulatory Visit
Admission: RE | Admit: 2020-03-19 | Discharge: 2020-03-19 | Disposition: A | Discharge status: Home / Self Care | Payer: Medicare Other | Source: Ambulatory Visit | Attending: Specialist | Admitting: Specialist

## 2020-03-19 ENCOUNTER — Other Ambulatory Visit: Payer: Self-pay

## 2020-03-19 DIAGNOSIS — M5136 Other intervertebral disc degeneration, lumbar region: Secondary | ICD-10-CM

## 2020-03-19 DIAGNOSIS — M4807 Spinal stenosis, lumbosacral region: Secondary | ICD-10-CM

## 2020-03-31 ENCOUNTER — Encounter: Payer: Self-pay | Admitting: Specialist

## 2020-03-31 ENCOUNTER — Other Ambulatory Visit: Payer: Self-pay

## 2020-03-31 ENCOUNTER — Ambulatory Visit (INDEPENDENT_AMBULATORY_CARE_PROVIDER_SITE_OTHER): Payer: Medicare Other | Admitting: Specialist

## 2020-03-31 VITALS — BP 152/94 | HR 87 | Ht 68.0 in | Wt 204.0 lb

## 2020-03-31 DIAGNOSIS — M5126 Other intervertebral disc displacement, lumbar region: Secondary | ICD-10-CM

## 2020-03-31 DIAGNOSIS — M4807 Spinal stenosis, lumbosacral region: Secondary | ICD-10-CM | POA: Diagnosis not present

## 2020-03-31 DIAGNOSIS — M5124 Other intervertebral disc displacement, thoracic region: Secondary | ICD-10-CM | POA: Diagnosis not present

## 2020-03-31 DIAGNOSIS — M5136 Other intervertebral disc degeneration, lumbar region: Secondary | ICD-10-CM | POA: Diagnosis not present

## 2020-03-31 NOTE — Progress Notes (Signed)
Office Visit Note   Patient: Jesus LEVENTHAL Sr.           Date of Birth: 15-Sep-1955           MRN: [id number] Visit Date: 03/31/2020              Requested by: Ronnald Nian, DO [place],  [place] [address] PCP: Ronnald Nian, DO   Assessment & Plan: Visit Diagnoses:  1. DDD (degenerative disc disease), lumbar   2. Spinal stenosis of lumbosacral region   3. 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 Herniation of lumbar intervertebral disc     Plan: Avoid frequent bending and stooping  No lifting greater than 10 lbs. May use ice or moist heat for pain. Weight loss is of benefit. Best medication for lumbar disc disease is arthritis medications like diclofenac. Exercise is important to improve your indurance and does allow people to function better inspite of back pain.  Follow-Up Instructions: No follow-ups on file.   Orders:  No orders of the defined types were placed in this encounter.  No orders of the defined types were placed in this encounter.     Procedures: No procedures performed   Clinical Data: Findings:  Narrative & Impression CLINICAL DATA:  Other intervertebral disc degeneration, lumbar region. Spinal stenosis of lumbosacral region. Osteoarthritis, lumbosacral; spinal stenosis, lumbosacral; neurogenic claudication right L4 or L5.  EXAM: MRI LUMBAR SPINE WITHOUT CONTRAST  TECHNIQUE: Multiplanar, multisequence MR imaging of the lumbar spine was performed. No intravenous contrast was administered.  COMPARISON:  Lumbar spine radiographs 02/26/2020.  FINDINGS: Segmentation:  5 lumbar vertebrae.  Alignment:  Trace L1-L2, L2-L3, L3-L4 grade 1 retrolisthesis.  Vertebrae: Vertebral body height is maintained. Trace degenerative endplate edema at J8-H6. additionally, there is edema at site of a tiny Schmorl node within the L1 inferior endplate. Multilevel ventrolateral  osteophytes.  Conus medullaris and cauda equina: Conus extends to the L1-L2 level. No signal abnormality within the visualized distal spinal cord.  Paraspinal and other soft tissues: Bilateral renal cysts. Paraspinal soft tissues within normal limits.  Disc levels:  Mild-to-moderate disc degeneration at L1-L2, L2-L3, L3-L4 and L4-L5.  T10-T11: This level is imaged sagittally. Left center disc protrusion contributing to mild/moderate spinal canal stenosis. The disc protrusion contacts and mildly flattens the ventral spinal cord. No significant foraminal stenosis  L1-L2: Grade 1 retrolisthesis. Disc uncovering with disc bulge. Mild left subarticular narrowing without frank nerve root impingement. Central canal patent. No significant foraminal stenosis.  L2-L3: Grade 1 retrolisthesis. Disc bulge with endplate spurring. Mild bilateral subarticular narrowing without frank nerve root impingement. Central canal patent. No significant foraminal stenosis.  L3-L4: Grade 1 retrolisthesis. Disc bulge with endplate spurring. Superimposed right subarticular disc extrusion with caudal migration to the inferior L4 vertebral body level. The disc extrusion contributes to right subarticular stenosis, encroaching upon the descending right L4 nerve root (for instance as seen on series 13, images 24-27). Central canal patent. No significant foraminal stenosis.  L4-L5: Disc bulge with endplate spurring. Mild facet arthrosis. No significant spinal canal stenosis. Bilateral neural foraminal narrowing (moderate right, mild left).  L5-S1: Mild facet arthrosis. No significant disc herniation or stenosis.  IMPRESSION: Lumbar spondylosis as outlined with findings most notably as follows.  At L3-L4, a right subarticular disc extrusion is present with caudal migration to the inferior L4 vertebral body level. The disc extrusion contributes to right subarticular stenosis, encroaching upon  the descending right L4 nerve  root. Correlate for right L4 radiculopathy.  At T10-T11, there is a left center disc protrusion contributing to mild/moderate spinal canal stenosis, contacting and mildly flattening the ventral spinal cord.  Multifactorial moderate right neural foraminal narrowing at L4-L5.   Electronically Signed   By: Kellie Simmering DO   On: 03/19/2020 10:47      Subjective: Chief Complaint  Patient presents with  . Lower Back - Follow-up    MRI Review of LSpine w/o contrast    64 year old male with history of back pain and sciatica. Mainly pain into the right leg. He is feeling better with the use of diclofenac and gabapentin. Pain is into the right anterior thigh and shin. No bowel or bladder difficulty. He is able to sleep with the gabapentin, refilled on  Saturday. He is taking it fairly consistently.    Review of Systems  Constitutional: Positive for activity change and unexpected weight change. Negative for appetite change, chills, diaphoresis, fatigue and fever.  HENT: Negative.  Negative for congestion, dental problem, drooling, ear discharge, ear pain, facial swelling, hearing loss, mouth sores, nosebleeds, postnasal drip, rhinorrhea, sinus pressure, sinus pain, sneezing, sore throat, tinnitus, trouble swallowing and voice change.   Eyes: Positive for visual disturbance (cataract eventual). Negative for photophobia, pain, discharge, redness and itching.  Respiratory: Negative.  Negative for apnea, cough, choking, chest tightness, shortness of breath, wheezing and stridor.   Cardiovascular: Negative.  Negative for chest pain, palpitations and leg swelling.  Gastrointestinal: Negative.  Negative for abdominal distention, abdominal pain, anal bleeding, blood in stool, constipation, diarrhea, nausea, rectal pain and vomiting.  Endocrine: Positive for cold intolerance. Negative for heat intolerance, polydipsia, polyphagia and polyuria.  Genitourinary:  Negative.  Negative for difficulty urinating, dysuria, enuresis, flank pain, hematuria and urgency.  Musculoskeletal: Positive for arthralgias, back pain and gait problem. Negative for joint swelling, myalgias, neck pain and neck stiffness.  Skin: Negative for color change, pallor, rash and wound.  Allergic/Immunologic: Negative for environmental allergies, food allergies and immunocompromised state.  Neurological: Negative for dizziness, tremors, seizures, syncope, facial asymmetry, speech difficulty, weakness, light-headedness, numbness and headaches.  Hematological: Negative for adenopathy. Does not bruise/bleed easily.  Psychiatric/Behavioral: Negative.  Negative for agitation, behavioral problems, confusion, decreased concentration, dysphoric mood, hallucinations, self-injury, sleep disturbance and suicidal ideas. The patient is not nervous/anxious and is not hyperactive.      Objective: Vital Signs: BP (!) 152/94 (BP Location: Left Arm, Patient Position: Sitting)   Pulse 87   Ht 5\' 8"  (1.727 m)   Wt 204 lb (92.5 kg)   BMI 31.02 kg/m   Physical Exam Constitutional:      Appearance: He is well-developed.  HENT:     Head: Normocephalic and atraumatic.  Eyes:     Pupils: Pupils are equal, round, and reactive to light.  Pulmonary:     Effort: Pulmonary effort is normal.     Breath sounds: Normal breath sounds.  Abdominal:     General: Bowel sounds are normal.     Palpations: Abdomen is soft.  Musculoskeletal:     Cervical back: Normal range of motion and neck supple.     Lumbar back: Negative right straight leg raise test and negative left straight leg raise test.  Skin:    General: Skin is warm and dry.  Neurological:     Mental Status: He is alert and oriented to person, place, and time.  Psychiatric:        Behavior: Behavior normal.  Thought Content: Thought content normal.        Judgment: Judgment normal.     Back Exam   Tenderness  The patient is  experiencing tenderness in the lumbar.  Range of Motion  Extension: abnormal  Flexion: abnormal  Lateral bend right: abnormal  Lateral bend left: abnormal  Rotation right: abnormal  Rotation left: abnormal   Muscle Strength  Right Quadriceps:  5/5  Left Quadriceps:  5/5  Right Hamstrings:  5/5  Left Hamstrings:  5/5   Tests  Straight leg raise right: negative Straight leg raise left: negative  Reflexes  Patellar: 0/4 Achilles: 0/4  Other  Toe walk: normal      Specialty Comments:  No specialty comments available.  Imaging: No results found.   PMFS History: Patient Active Problem List   Diagnosis Date Noted  . Tobacco use disorder 11/22/2019  . DM type 2 with diabetic mixed hyperlipidemia ([place]) 11/22/2019  . Benign essential HTN 11/22/2019  . BPH with elevated PSA 11/22/2019  . Squamous cell carcinoma of arm, 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  . Cervical arthritis 11/22/2019  . DDD (degenerative disc disease), lumbar 11/22/2019   History reviewed. No pertinent past medical history.  History reviewed. No pertinent family history.  Past Surgical History:  Procedure Laterality Date  . APPENDECTOMY     64yo  . MOHS SURGERY     SCC Rt arm  . TONSILLECTOMY     Social History   Occupational History  . Not on file  Tobacco Use  . Smoking status: Current Every Day Smoker    Types: Cigarettes  . Smokeless tobacco: Never Used  Substance and Sexual Activity  . Alcohol use: Yes    Comment: Occas  . Drug use: Never  . Sexual activity: Not Currently

## 2020-03-31 NOTE — Patient Instructions (Signed)
Avoid frequent bending and stooping  No lifting greater than 10 lbs. May use ice or moist heat for pain. Weight loss is of benefit. Best medication for lumbar disc disease is arthritis medications like diclofenac. Exercise is important to improve your indurance and does allow people to function better inspite of back pain.

## 2020-06-04 ENCOUNTER — Encounter: Payer: Self-pay | Admitting: Family Medicine

## 2020-06-18 ENCOUNTER — Other Ambulatory Visit: Payer: Self-pay

## 2020-06-18 ENCOUNTER — Encounter: Payer: Self-pay | Admitting: Family Medicine

## 2020-06-18 ENCOUNTER — Ambulatory Visit (INDEPENDENT_AMBULATORY_CARE_PROVIDER_SITE_OTHER): Payer: Medicare Other | Admitting: Family Medicine

## 2020-06-18 VITALS — BP 132/78 | HR 87 | Temp 97.9°F | Ht 68.0 in | Wt 204.4 lb

## 2020-06-18 DIAGNOSIS — E782 Mixed hyperlipidemia: Secondary | ICD-10-CM

## 2020-06-18 DIAGNOSIS — E781 Pure hyperglyceridemia: Secondary | ICD-10-CM

## 2020-06-18 DIAGNOSIS — E1169 Type 2 diabetes mellitus with other specified complication: Secondary | ICD-10-CM | POA: Diagnosis not present

## 2020-06-18 DIAGNOSIS — I10 Essential (primary) hypertension: Secondary | ICD-10-CM | POA: Diagnosis not present

## 2020-06-18 DIAGNOSIS — Z2821 Immunization not carried out because of patient refusal: Secondary | ICD-10-CM | POA: Diagnosis not present

## 2020-06-18 NOTE — Progress Notes (Addendum)
Jesus Clayman Sr. is a 64 y.o. male  Chief Complaint  Patient presents with  . Follow-up    6 month f/u meds. Declines flu shot today.     HPI: Jesus WANDLER Sr. is a 64 y.o. male seen today for routine f/u on DM, HTN, hypertriglyceridemia.  For HTN pt is taking norvasc 5mg  daily, lisinopirl 40mg  daily, HCTZ 25mg  daily.  For HLD pt is taking lipitor 20mg  daily. Pt is not on any meds for DM.  He does not need any med refills today. He follows with Dr. Louanne Skye ortho. He Rx'd gabapentin x 3-4 mo and diclofenac 50mg  daily. His next appt is 09/2019.   Lab Results  Component Value Date   HGBA1C 7.1 (H) 11/29/2019   Lab Results  Component Value Date   CHOL 152 11/29/2019   HDL 37.90 (L) 11/29/2019   LDLDIRECT 83.0 11/29/2019   TRIG 383.0 (H) 11/29/2019   CHOLHDL 4 11/29/2019   Lab Results  Component Value Date   CREATININE 0.80 11/29/2019   BUN 9 11/29/2019   NA 132 (L) 11/29/2019   K 3.8 11/29/2019   CL 99 11/29/2019   CO2 29 11/29/2019   History reviewed. No pertinent past medical history.  Past Surgical History:  Procedure Laterality Date  . APPENDECTOMY     64yo  . MOHS SURGERY     SCC Rt arm  . TONSILLECTOMY      Social History   Socioeconomic History  . Marital status: Widowed    Spouse name: Not on file  . Number of children: Not on file  . Years of education: Not on file  . Highest education level: Not on file  Occupational History  . Not on file  Tobacco Use  . Smoking status: Current Every Day Smoker    Types: Cigarettes  . Smokeless tobacco: Never Used  Substance and Sexual Activity  . Alcohol use: Yes    Comment: Occas  . Drug use: Never  . Sexual activity: Not Currently  Other Topics Concern  . Not on file  Social History Narrative  . Not on file   Social Determinants of Health   Financial Resource Strain: Not on file  Food Insecurity: Not on file  Transportation Needs: Not on file  Physical Activity: Not on file  Stress: Not on  file  Social Connections: Not on file  Intimate Partner Violence: Not on file    History reviewed. No pertinent family history.   Immunization History  Administered Date(s) Administered  . Moderna Sars-Covid-2 Vaccination 09/26/2019, 10/25/2019, 06/03/2020  . Zoster Recombinat (Shingrix) 11/29/2019    Outpatient Encounter Medications as of 06/18/2020  Medication Sig  . amLODipine (NORVASC) 5 MG tablet Take 5 mg by mouth daily.  Marland Kitchen atorvastatin (LIPITOR) 20 MG tablet Take 1 tablet by mouth daily.  . diclofenac (VOLTAREN) 50 MG EC tablet Take 1 tablet (50 mg total) by mouth daily after breakfast.  . dutasteride (AVODART) 0.5 MG capsule Take 1 capsule by mouth daily.  Marland Kitchen gabapentin (NEURONTIN) 100 MG capsule Take 1 capsule (100 mg total) by mouth at bedtime.  . hydrochlorothiazide (HYDRODIURIL) 25 MG tablet Take 1 tablet by mouth daily.  Marland Kitchen lisinopril (ZESTRIL) 40 MG tablet Take 1 tablet by mouth daily.  . [DISCONTINUED] acetaminophen (TYLENOL) 500 MG tablet Take 1 tablet (500 mg total) by mouth every 8 (eight) hours as needed.  . [DISCONTINUED] cyclobenzaprine (FLEXERIL) 5 MG tablet Take 1-2 tablets (5-10 mg total) by mouth at bedtime.  No facility-administered encounter medications on file as of 06/18/2020.     ROS: Pertinent positives and negatives noted in HPI. Remainder of ROS non-contributory   No Known Allergies  BP 132/78   Pulse 87   Temp 97.9 F (36.6 C) (Temporal)   Ht 5\' 8"  (1.727 m)   Wt 204 lb 6.4 oz (92.7 kg)   SpO2 95%   BMI 31.08 kg/m    BP Readings from Last 3 Encounters:  06/18/20 132/78  03/31/20 (!) 152/94  02/26/20 (!) 151/87   Pulse Readings from Last 3 Encounters:  06/18/20 87  03/31/20 87  02/26/20 73   Wt Readings from Last 3 Encounters:  06/18/20 204 lb 6.4 oz (92.7 kg)  03/31/20 204 lb (92.5 kg)  02/26/20 205 lb (93 kg)     Physical Exam Constitutional:      General: He is not in acute distress.    Appearance: Normal appearance.  He is not ill-appearing.  Cardiovascular:     Rate and Rhythm: Normal rate and regular rhythm.     Pulses: Normal pulses.  Pulmonary:     Effort: Pulmonary effort is normal.     Breath sounds: Normal breath sounds. No wheezing or rhonchi.  Musculoskeletal:     Right lower leg: No edema.     Left lower leg: No edema.  Neurological:     Mental Status: He is alert and oriented to person, place, and time.  Psychiatric:        Mood and Affect: Mood normal.        Behavior: Behavior normal.      A/P:  1. DM type 2 with diabetic mixed hyperlipidemia ([place]) - last A1C = 7.1 in 12/2019 - not on any meds - Hemoglobin A1c  2. Benign essential HTN - controlled, at goal - cont amlodipine 5mg  daily, lisinopril 40mg  daily, HCTZ 25mg  daily - Basic metabolic panel  3. Hypertriglyceridemia - on lipitor 20mg  daily - Lipid panel  4. Influenza vaccination declined by patient    This visit occurred during the SARS-CoV-2 public health emergency.  Safety protocols were in place, including screening questions prior to the visit, additional usage of staff PPE, and extensive cleaning of exam room while observing appropriate contact time as indicated for disinfecting solutions.

## 2020-06-19 ENCOUNTER — Encounter: Payer: Self-pay | Admitting: Family Medicine

## 2020-06-19 ENCOUNTER — Other Ambulatory Visit: Payer: Self-pay | Admitting: Family Medicine

## 2020-06-19 DIAGNOSIS — E1169 Type 2 diabetes mellitus with other specified complication: Secondary | ICD-10-CM

## 2020-06-19 DIAGNOSIS — I10 Essential (primary) hypertension: Secondary | ICD-10-CM

## 2020-06-19 LAB — LIPID PANEL
Cholesterol: 165 mg/dL (ref 0–200)
HDL: 41.6 mg/dL (ref >39.00)
NonHDL: 123.47
Total CHOL/HDL Ratio: 4
Triglycerides: 282 mg/dL — ABNORMAL HIGH (ref 0.0–149.0)
VLDL: 56.4 mg/dL — ABNORMAL HIGH (ref 0.0–40.0)

## 2020-06-19 LAB — BASIC METABOLIC PANEL
BUN: 7 mg/dL (ref 6–23)
CO2: 32 mEq/L (ref 19–32)
Calcium: 9.4 mg/dL (ref 8.4–10.5)
Chloride: 92 mEq/L — ABNORMAL LOW (ref 96–112)
Creatinine, Ser: 0.87 mg/dL (ref 0.40–1.50)
GFR: 90.61 mL/min (ref >60.00)
Glucose, Bld: 159 mg/dL — ABNORMAL HIGH (ref 70–99)
Potassium: 3.8 mEq/L (ref 3.5–5.1)
Sodium: 131 mEq/L — ABNORMAL LOW (ref 135–145)

## 2020-06-19 LAB — HEMOGLOBIN A1C: Hgb A1c MFr Bld: 8.3 % — ABNORMAL HIGH (ref 4.6–6.5)

## 2020-06-19 LAB — LDL CHOLESTEROL, DIRECT: Direct LDL: 102 mg/dL

## 2020-06-19 MED ORDER — METFORMIN HCL ER 500 MG PO TB24: ORAL_TABLET | ORAL | 3 refills | Status: DC

## 2020-06-19 MED ORDER — AMLODIPINE BESYLATE 10 MG PO TABS: 10.0000 mg | ORAL_TABLET | Freq: Every day | ORAL | 3 refills | Status: DC

## 2020-06-19 MED ORDER — ATORVASTATIN CALCIUM 40 MG PO TABS: 40.0000 mg | ORAL_TABLET | Freq: Every day | ORAL | 3 refills | Status: DC

## 2020-06-25 MED ORDER — PIOGLITAZONE HCL 30 MG PO TABS: 30.0000 mg | ORAL_TABLET | Freq: Every day | ORAL | 3 refills | Status: DC

## 2020-07-08 ENCOUNTER — Other Ambulatory Visit: Payer: Self-pay | Admitting: Specialist

## 2020-07-08 MED ORDER — GABAPENTIN 100 MG PO CAPS: 100.0000 mg | ORAL_CAPSULE | Freq: Every day | ORAL | 3 refills | Status: DC

## 2020-07-08 NOTE — Telephone Encounter (Signed)
Patient called needing Rx refilled Gabapentin. The pharmacy address is  [facility] at [facility] [address] [facility] [address].The number to contact patient is [phone]

## 2020-07-10 ENCOUNTER — Telehealth: Payer: Self-pay

## 2020-07-10 MED ORDER — DUTASTERIDE 0.5 MG PO CAPS: 0.5000 mg | ORAL_CAPSULE | Freq: Every day | ORAL | 3 refills | Status: DC

## 2020-07-10 MED ORDER — LISINOPRIL 40 MG PO TABS: 40.0000 mg | ORAL_TABLET | Freq: Every day | ORAL | 3 refills | Status: DC

## 2020-07-10 NOTE — Telephone Encounter (Signed)
Patient calling to request a refill of his medications, Dutasteride 0.5mg : Lisinopril 40mg  and also would like to inform Dr. that he has not been able to take his BP.  I informed pt that he could purchase a machine at his local pharmacy. Please call the pt if any questions CB# [phone]  Please see message and advise.  Thank you.   Last fill for both medications/unknown/Historical provider. Last OV 06/18/20

## 2020-07-10 NOTE — Telephone Encounter (Signed)
Refills sent. Pt can get BP cuff online or at [facility] ([facility], [facility], [facility]). Omron is a good brand

## 2020-07-11 NOTE — Telephone Encounter (Signed)
Patient notified VIA phone and he picked up a BP machine yesterday.  Dm/cma

## 2020-09-29 ENCOUNTER — Other Ambulatory Visit: Payer: Self-pay

## 2020-09-29 ENCOUNTER — Ambulatory Visit (INDEPENDENT_AMBULATORY_CARE_PROVIDER_SITE_OTHER): Payer: Medicare Other | Admitting: Specialist

## 2020-09-29 ENCOUNTER — Encounter: Payer: Self-pay | Admitting: Specialist

## 2020-09-29 VITALS — BP 138/88 | HR 80 | Ht 68.0 in | Wt 205.0 lb

## 2020-09-29 DIAGNOSIS — M5136 Other intervertebral disc degeneration, lumbar region: Secondary | ICD-10-CM

## 2020-09-29 DIAGNOSIS — I200 Unstable angina: Secondary | ICD-10-CM

## 2020-09-29 DIAGNOSIS — M5126 Other intervertebral disc displacement, lumbar region: Secondary | ICD-10-CM

## 2020-09-29 DIAGNOSIS — M4807 Spinal stenosis, lumbosacral region: Secondary | ICD-10-CM

## 2020-09-29 MED ORDER — DICLOFENAC SODIUM 50 MG PO TBEC
50.0000 mg | DELAYED_RELEASE_TABLET | Freq: Every day | ORAL | 4 refills | Status: DC
Start: 2020-09-29 — End: 2021-01-23

## 2020-09-29 NOTE — Progress Notes (Signed)
Office Visit Note   Patient: Jesus SALEHI Sr.           Date of Birth: Sep 09, 1955           MRN: [id number] Visit Date: 09/29/2020              Requested by: Ronnald Nian, DO No address on file PCP: Haydee Salter, MD   Assessment & Plan: Visit Diagnoses:  1. Spinal stenosis of lumbosacral region   2. Herniation of lumbar intervertebral disc   3. DDD (degenerative disc disease), lumbar     Plan: Avoid bending, stooping and avoid lifting weights greater than 10 lbs. Avoid prolong standing and walking. Avoid frequent bending and stooping  No lifting greater than 10 lbs. May use ice or moist heat for pain. Weight loss is of benefit. Handicap license is approved. Epidural steroids are sometimes helpful in relieving or improving pain due to spinal stenosis. Call if you wish to consider injection treatment.     Follow-Up Instructions: Return in about 6 months (around 04/01/2021).   Orders:  No orders of the defined types were placed in this encounter.  Meds ordered this encounter  Medications   DISCONTD: diclofenac (VOLTAREN) 50 MG EC tablet    Sig: Take 1 tablet (50 mg total) by mouth daily after breakfast.    Dispense:  90 tablet    Refill:  4      Procedures: No procedures performed   Clinical Data: No additional findings.   Subjective: Chief Complaint  Patient presents with   Lower Back - Follow-up    65 year old male with history of low back pain with sciatics. He has had 2 business trips and 2 trips for pleasure, used a Web designer at [employer] and also had to stop walking up hill twice to get to football game at [employer] in [place]. He was in [place] and walked 1/2 mile and  Had to stop walking 1/2 mile. Traveled to [place], using the handicapped sticker he had trouble walking up the incline. Now the airport has valet which helps. In [place] walking to the gate he has been having numbness into the right leg. Increasing symptoms with standing and  walking. Using a cart to stand and walk at the grocery store. Sitting in the car to return home its fine. Back in June 2021 at the [facility] problems with incline. Has to sit to see improvement. Standing in line it starts to bother him. No bowel or bladder difficulty. This  Trouble didn't start till 10 months ago. Plans to retire in Nov. 2022. Taking diclofenac and gabapentin for pain. Difficulty with stair climbing and with walking he has a lurch on the right side.     Review of Systems  Constitutional: Negative.   HENT: Negative.    Eyes: Negative.   Respiratory: Negative.    Cardiovascular: Negative.   Gastrointestinal: Negative.   Endocrine: Negative.   Genitourinary: Negative.   Musculoskeletal: Negative.   Skin: Negative.   Allergic/Immunologic: Negative.   Neurological: Negative.   Hematological: Negative.   Psychiatric/Behavioral: Negative.       Objective: Vital Signs: BP 138/88 (BP Location: Left Arm, Patient Position: Sitting)   Pulse 80   Ht 5\' 8"  (1.727 m)   Wt 205 lb (93 kg)   BMI 31.17 kg/m   Physical Exam Constitutional:      Appearance: He is well-developed.  HENT:     Head: Normocephalic and atraumatic.  Eyes:     Pupils: Pupils are equal, round, and reactive to light.  Pulmonary:     Effort: Pulmonary effort is normal.     Breath sounds: Normal breath sounds.  Abdominal:     General: Bowel sounds are normal.     Palpations: Abdomen is soft.  Musculoskeletal:     Cervical back: Normal range of motion and neck supple.     Lumbar back: Negative right straight leg raise test and negative left straight leg raise test.  Skin:    General: Skin is warm and dry.  Neurological:     Mental Status: He is alert and oriented to person, place, and time.  Psychiatric:        Behavior: Behavior normal.        Thought Content: Thought content normal.        Judgment: Judgment normal.     Back Exam   Tenderness  The patient is experiencing tenderness  in the lumbar.  Range of Motion  Extension:  abnormal  Flexion:  normal  Lateral bend right:  normal  Lateral bend left:  normal  Rotation right:  normal  Rotation left:  normal   Muscle Strength  Right Quadriceps:  5/5  Left Quadriceps:  5/5  Right Hamstrings:  5/5  Left Hamstrings:  5/5   Tests  Straight leg raise right: negative Straight leg raise left: negative  Reflexes  Patellar:  0/4 Achilles:  0/4 Babinski's sign: normal   Other  Toe walk: normal Heel walk: normal Sensation: decreased Gait: normal       Specialty Comments:  No specialty comments available.  Imaging: No results found.   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   CAD (coronary artery disease) 02/13/2021   Type 2 diabetes mellitus with cardiac complication ([place]) 93/81/8299   Essential hypertension 11/22/2019   BPH with elevated PSA 11/22/2019   Squamous cell carcinoma of arm, 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   Cervical arthritis 11/22/2019   DDD (degenerative disc disease), lumbar 11/22/2019   Past Medical History:  Diagnosis Date   Cancer ([place])    Diabetes mellitus without complication ([place])    Hyperlipidemia 03/13/2021    Family History  Problem Relation Age of Onset   Arthritis Mother    Cancer Father    Hypertension Father    Diabetes Sister    Stroke Brother     Past Surgical History:  Procedure Laterality Date   APPENDECTOMY     65yo   CORONARY ARTERY BYPASS GRAFT N/A 02/16/2021   Procedure: CORONARY ARTERY BYPASS GRAFTING (CABG)x 2 ON CARDIOPULMONARY BYPASS USING RIGHT GSV AND LIMA.;  Surgeon: Lajuana Matte, MD;  Location: [facility];   Service: Open Heart Surgery;  Laterality: N/A;   ENDOVEIN HARVEST OF GREATER SAPHENOUS VEIN Right 02/16/2021   Procedure: ENDOVEIN HARVEST OF GREATER SAPHENOUS VEIN;  Surgeon: Lajuana Matte, MD;  Location: [facility];  Service: Open Heart Surgery;  Laterality: Right;   MOHS SURGERY     SCC Rt arm   RIGHT/LEFT HEART CATH AND CORONARY ANGIOGRAPHY N/A 02/13/2021   Procedure: RIGHT/LEFT HEART CATH AND CORONARY ANGIOGRAPHY;  Surgeon: Belva Crome,  MD;  Location: [facility];  Service: Cardiovascular;  Laterality: N/A;   TEE WITHOUT CARDIOVERSION N/A 02/16/2021   Procedure: TRANSESOPHAGEAL ECHOCARDIOGRAM (TEE);  Surgeon: Lajuana Matte, MD;  Location: [place];  Service: Open Heart Surgery;  Laterality: N/A;   TONSILLECTOMY     Social History   Occupational History   Occupation: Visual merchandiser    Comment: [employer]  Tobacco Use   Smoking status: Every Day    Packs/day: 1.00    Types: Cigarettes   Smokeless tobacco: Never  Vaping Use   Vaping Use: Never used  Substance and Sexual Activity   Alcohol use: Yes    Comment: Occas   Drug use: Never   Sexual activity: Not Currently

## 2020-11-12 ENCOUNTER — Telehealth: Payer: Self-pay | Admitting: Family Medicine

## 2020-11-12 NOTE — Telephone Encounter (Signed)
Patient would like his physical within the next few weeks. Dr. Bryan Lemma is booked for physicals until the end of July based off of her template. Please advise.  Patients contact info: [phone] or send MyChart message.

## 2020-11-12 NOTE — Telephone Encounter (Signed)
He can be on the cancellation list.

## 2021-01-08 ENCOUNTER — Other Ambulatory Visit: Payer: Self-pay

## 2021-01-08 ENCOUNTER — Encounter: Payer: Self-pay | Admitting: Family Medicine

## 2021-01-08 ENCOUNTER — Ambulatory Visit (INDEPENDENT_AMBULATORY_CARE_PROVIDER_SITE_OTHER): Payer: Medicare Other | Admitting: Family Medicine

## 2021-01-08 VITALS — BP 126/82 | HR 87 | Temp 98.0°F | Ht 68.0 in | Wt 204.0 lb

## 2021-01-08 DIAGNOSIS — I452 Bifascicular block: Secondary | ICD-10-CM | POA: Diagnosis not present

## 2021-01-08 DIAGNOSIS — E1169 Type 2 diabetes mellitus with other specified complication: Secondary | ICD-10-CM

## 2021-01-08 DIAGNOSIS — R600 Localized edema: Secondary | ICD-10-CM

## 2021-01-08 DIAGNOSIS — E782 Mixed hyperlipidemia: Secondary | ICD-10-CM | POA: Diagnosis not present

## 2021-01-08 DIAGNOSIS — I10 Essential (primary) hypertension: Secondary | ICD-10-CM

## 2021-01-08 DIAGNOSIS — R7989 Other specified abnormal findings of blood chemistry: Secondary | ICD-10-CM

## 2021-01-08 LAB — COMPREHENSIVE METABOLIC PANEL
ALT: 20 U/L (ref 0–53)
AST: 18 U/L (ref 0–37)
Albumin: 4.5 g/dL (ref 3.5–5.2)
Alkaline Phosphatase: 69 U/L (ref 39–117)
BUN: 8 mg/dL (ref 6–23)
CO2: 27 mEq/L (ref 19–32)
Calcium: 9.3 mg/dL (ref 8.4–10.5)
Chloride: 97 mEq/L (ref 96–112)
Creatinine, Ser: 0.77 mg/dL (ref 0.40–1.50)
GFR: 93.65 mL/min (ref >60.00)
Glucose, Bld: 118 mg/dL — ABNORMAL HIGH (ref 70–99)
Potassium: 3.9 mEq/L (ref 3.5–5.1)
Sodium: 134 mEq/L — ABNORMAL LOW (ref 135–145)
Total Bilirubin: 0.6 mg/dL (ref 0.2–1.2)
Total Protein: 7.6 g/dL (ref 6.0–8.3)

## 2021-01-08 LAB — LIPID PANEL
Cholesterol: 172 mg/dL (ref 0–200)
HDL: 49.6 mg/dL (ref >39.00)
LDL Cholesterol: 99 mg/dL (ref 0–99)
NonHDL: 122.42
Total CHOL/HDL Ratio: 3
Triglycerides: 115 mg/dL (ref 0.0–149.0)
VLDL: 23 mg/dL (ref 0.0–40.0)

## 2021-01-08 LAB — TSH: TSH: 6.51 u[IU]/mL — ABNORMAL HIGH (ref 0.35–5.50)

## 2021-01-08 LAB — HEMOGLOBIN A1C: Hgb A1c MFr Bld: 6.8 % — ABNORMAL HIGH (ref 4.6–6.5)

## 2021-01-08 NOTE — Addendum Note (Signed)
Addended by: Haydee Salter on: 01/08/2021 06:19 PM   Modules accepted: Orders

## 2021-01-08 NOTE — Progress Notes (Signed)
[facility] [address] [place] [address] Dept: [phone] Dept Fax: [phone]  Office Visit  Subjective:    Patient ID: Jesus Clayman Sr., male    DOB: 03-Feb-1956, 65 y.o..   MRN: [id number]  Chief Complaint  Patient presents with   Acute Visit    C/o having bilateral feet swelling x 2-3 weeks more in the evenings.   Reports having covid 12/12/20.    History of Present Illness:  Patient is in today for evaluation of pedal edema. Jesus Davenport states he had been in [place] about a month ago. He was doing increased walking while there. He denies any significant shortness of breath. He has had some low back pain related to a history of lumbar DDD. Around the time of his return, he was feeling ill and tested positive for COVID-19. He was treated with a course of Paxlovid. Over the past 3 weeks, he has been noting some swelling in his feet and ankles. This is improved in the morning, but worsens during the day. He only occasionally notes some shortness of breath with exertion. He denies any orthopnea or PND. Jesus Davenport is a smoker. He also has a history of Type 2 diabetes, hypertension and hyperlipidemia. Around the time he had COVID, he had an episode of shortness of breath that prompted him to contact the EMS. They performed a 12-lead EKG at his home which he brought with him.  Past Medical History: Patient Active Problem List   Diagnosis Date Noted   Tobacco use disorder 11/22/2019   DM type 2 with diabetic mixed hyperlipidemia ([place]) 11/22/2019   Essential hypertension 11/22/2019   BPH with elevated PSA 11/22/2019   Squamous cell carcinoma of arm, 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   Cervical arthritis 11/22/2019   DDD (degenerative disc disease), lumbar 11/22/2019   Past Surgical History:  Procedure Laterality Date   APPENDECTOMY     65yo   MOHS SURGERY     SCC Rt arm   TONSILLECTOMY     Family History  Problem Relation Age of  Onset   Arthritis Mother    Cancer Father    Hypertension Father    Outpatient Medications Prior to Visit  Medication Sig Dispense Refill   amLODipine (NORVASC) 10 MG tablet Take 1 tablet (10 mg total) by mouth daily. 90 tablet 3   atorvastatin (LIPITOR) 40 MG tablet Take 1 tablet (40 mg total) by mouth daily. 90 tablet 3   diclofenac (VOLTAREN) 50 MG EC tablet Take 1 tablet (50 mg total) by mouth daily after breakfast. 90 tablet 4   dutasteride (AVODART) 0.5 MG capsule Take 1 capsule (0.5 mg total) by mouth daily. 90 capsule 3   lisinopril (ZESTRIL) 40 MG tablet Take 1 tablet (40 mg total) by mouth daily. 90 tablet 3   pioglitazone (ACTOS) 30 MG tablet Take 1 tablet (30 mg total) by mouth daily. 90 tablet 3   gabapentin (NEURONTIN) 100 MG capsule Take 1 capsule (100 mg total) by mouth at bedtime. (Patient not taking: Reported on 01/08/2021) 30 capsule 3   No facility-administered medications prior to visit.   No Known Allergies    Objective:   Today's Vitals   01/08/21 0927  BP: 126/82  Pulse: 87  Temp: 98 F (36.7 C)  TempSrc: Temporal  SpO2: 99%  Weight: 204 lb (92.5 kg)  Height: 5\' 8"  (1.727 m)   Body mass index is 31.02 kg/m.   General: Well developed, well nourished. No  acute distress. Lungs: Clear to auscultation bilaterally. No wheezing, rales or rhonchi. CV: RRR without murmurs or rubs. Pulses 2+ bilaterally. Extremities: Trace edema noted. Psych: Alert and oriented. Normal mood and affect.  Health Maintenance Due  Topic Date Due   FOOT EXAM  Never done   OPHTHALMOLOGY EXAM  Never done   HIV Screening  Never done   Hepatitis C Screening  Never done   TETANUS/TDAP  Never done   COLONOSCOPY (Pts 45-4yrs Insurance coverage will need to be confirmed)  Never done   PNA vac Low Risk Adult (1 of 2 - PCV13) Never done   Zoster Vaccines- Shingrix (2 of 2) 01/24/2020   COVID-19 Vaccine (4 - Booster for Moderna series) 09/01/2020   HEMOGLOBIN A1C  12/17/2020   EKG  (12/16/2020)- Performed by EMS. Regular sinus rhythm. RBBB pattern, though QRS duration is 0.118. Left anterior hemiblock.    Assessment & Plan:   1. Pedal edema Jesus Davenport is on several medications that could be contributing to his trace edema (diclofenac, amlodipine, pioglitazone). The pioglitazone was the most recently added. I will check screening labs to rule out thyroid, liver, or kidney issues that could contribute. He also could have a cardiac source for this, esp. in light of his tobacco use, diabetes, and his abnormal EKG findings. I will refer him to cardiology for assessment.  - Comprehensive metabolic panel - TSH - Ambulatory referral to Cardiology  2. Right bundle branch block (RBBB) with left anterior hemiblock  There is a very poor faxed copy of a prior EKG on Mr. Doughman's chart from 01/2020. It appears that his QRS interval is now longer and he did not previously have a left axis deviation. I recommend he have this evaluated by cardiology.  - Ambulatory referral to Cardiology  3. DM type 2 with diabetic mixed hyperlipidemia ([employer]) Due for quarterly DM labs.  - Lipid panel - Hemoglobin A1c  4. Essential hypertension Blood pressure at goal. Continue lisinopril and amlodipine.  Haydee Salter, MD

## 2021-01-22 ENCOUNTER — Other Ambulatory Visit: Payer: Self-pay

## 2021-01-23 ENCOUNTER — Encounter: Payer: Self-pay | Admitting: Family Medicine

## 2021-01-23 ENCOUNTER — Ambulatory Visit (INDEPENDENT_AMBULATORY_CARE_PROVIDER_SITE_OTHER): Payer: Medicare Other | Admitting: Family Medicine

## 2021-01-23 VITALS — BP 130/70 | HR 83 | Temp 97.5°F | Ht 68.0 in | Wt 206.6 lb

## 2021-01-23 DIAGNOSIS — N400 Enlarged prostate without lower urinary tract symptoms: Secondary | ICD-10-CM

## 2021-01-23 DIAGNOSIS — E038 Other specified hypothyroidism: Secondary | ICD-10-CM | POA: Insufficient documentation

## 2021-01-23 DIAGNOSIS — R600 Localized edema: Secondary | ICD-10-CM | POA: Diagnosis not present

## 2021-01-23 DIAGNOSIS — R972 Elevated prostate specific antigen [PSA]: Secondary | ICD-10-CM

## 2021-01-23 DIAGNOSIS — R7989 Other specified abnormal findings of blood chemistry: Secondary | ICD-10-CM

## 2021-01-23 DIAGNOSIS — Z23 Encounter for immunization: Secondary | ICD-10-CM | POA: Diagnosis not present

## 2021-01-23 DIAGNOSIS — E782 Mixed hyperlipidemia: Secondary | ICD-10-CM

## 2021-01-23 DIAGNOSIS — E1169 Type 2 diabetes mellitus with other specified complication: Secondary | ICD-10-CM | POA: Diagnosis not present

## 2021-01-23 DIAGNOSIS — Z1211 Encounter for screening for malignant neoplasm of colon: Secondary | ICD-10-CM | POA: Diagnosis not present

## 2021-01-23 DIAGNOSIS — I10 Essential (primary) hypertension: Secondary | ICD-10-CM | POA: Diagnosis not present

## 2021-01-23 DIAGNOSIS — M5136 Other intervertebral disc degeneration, lumbar region: Secondary | ICD-10-CM

## 2021-01-23 DIAGNOSIS — C44622 Squamous cell carcinoma of skin of right upper limb, including shoulder: Secondary | ICD-10-CM

## 2021-01-23 LAB — TSH: TSH: 5.52 u[IU]/mL — ABNORMAL HIGH (ref 0.35–5.50)

## 2021-01-23 LAB — T4, FREE: Free T4: 0.65 ng/dL (ref 0.60–1.60)

## 2021-01-23 MED ORDER — LISINOPRIL 40 MG PO TABS: 40.0000 mg | ORAL_TABLET | Freq: Every day | ORAL | 3 refills | Status: DC

## 2021-01-23 MED ORDER — AMLODIPINE BESYLATE 10 MG PO TABS: 10.0000 mg | ORAL_TABLET | Freq: Every day | ORAL | 3 refills | Status: DC

## 2021-01-23 MED ORDER — PIOGLITAZONE HCL 30 MG PO TABS: 30.0000 mg | ORAL_TABLET | Freq: Every day | ORAL | 3 refills | Status: DC

## 2021-01-23 MED ORDER — DUTASTERIDE 0.5 MG PO CAPS: 0.5000 mg | ORAL_CAPSULE | Freq: Every day | ORAL | 3 refills | Status: DC

## 2021-01-23 MED ORDER — DICLOFENAC SODIUM 50 MG PO TBEC: 50.0000 mg | DELAYED_RELEASE_TABLET | Freq: Every day | ORAL | 1 refills | Status: DC

## 2021-01-23 MED ORDER — ATORVASTATIN CALCIUM 40 MG PO TABS: 40.0000 mg | ORAL_TABLET | Freq: Every day | ORAL | 3 refills | Status: DC

## 2021-01-23 NOTE — Progress Notes (Signed)
Jesus Clayman Sr. is a 65 y.o. male  Chief Complaint  Patient presents with   Annual Exam    CPE.    HPI: Jesus ROBTOY Sr. is a 65 y.o. male patient seen today for routine f/u on chronic medical issues including HTN, DM, BPH w/ elevated PSA. For HTN, pt takes lisinopril '40mg'$  daily and norvasc '10mg'$  daily. For DM, pt takes actos '30mg'$  daily. He is also on lipitor '40mg'$  daily.  For BPH, pt takes avodart 0.'5mg'$  daily.  He had labs done at appt on 01/08/2021. TSH was elevated at that time and plan is to repeat TSH with T4 today.  He has an appt with cardio on 02/06/21. Will get 2nd dose of shingrix at pharmacy.  He requests referrals for derm and for colonoscopy. Last colo was close to 15 years ago.  Lab Results  Component Value Date   HGBA1C 6.8 (H) 01/08/2021   Lab Results  Component Value Date   NA 134 (L) 01/08/2021   K 3.9 01/08/2021   CREATININE 0.77 01/08/2021   GLUCOSE 118 (H) 01/08/2021   Lab Results  Component Value Date   ALT 20 01/08/2021   AST 18 01/08/2021   ALKPHOS 69 01/08/2021   BILITOT 0.6 01/08/2021   Lab Results  Component Value Date   CHOL 172 01/08/2021   HDL 49.60 01/08/2021   LDLCALC 99 01/08/2021   LDLDIRECT 102.0 06/18/2020   TRIG 115.0 01/08/2021   CHOLHDL 3 01/08/2021   Lab Results  Component Value Date   TSH 6.51 (H) 01/08/2021     Past Medical History:  Diagnosis Date   Cancer ([place])    Diabetes mellitus without complication ([place])     Past Surgical History:  Procedure Laterality Date   APPENDECTOMY     65yo   MOHS SURGERY     SCC Rt arm   TONSILLECTOMY      Social History   Socioeconomic History   Marital status: Widowed    Spouse name: Not on file   Number of children: Not on file   Years of education: Not on file   Highest education level: Not on file  Occupational History   Not on file  Tobacco Use   Smoking status: Every Day    Packs/day: 1.00    Types: Cigarettes   Smokeless tobacco: Never  Vaping Use    Vaping Use: Never used  Substance and Sexual Activity   Alcohol use: Yes    Comment: Occas   Drug use: Never   Sexual activity: Not Currently  Other Topics Concern   Not on file  Social History Narrative   Not on file   Social Determinants of Health   Financial Resource Strain: Not on file  Food Insecurity: Not on file  Transportation Needs: Not on file  Physical Activity: Not on file  Stress: Not on file  Social Connections: Not on file  Intimate Partner Violence: Not on file    Family History  Problem Relation Age of Onset   Arthritis Mother    Cancer Father    Hypertension Father      Immunization History  Administered Date(s) Administered   Moderna Sars-Covid-2 Vaccination 09/26/2019, 10/25/2019, 06/03/2020   Zoster Recombinat (Shingrix) 11/29/2019    Outpatient Encounter Medications as of 01/23/2021  Medication Sig   amLODipine (NORVASC) 10 MG tablet Take 1 tablet (10 mg total) by mouth daily.   atorvastatin (LIPITOR) 40 MG tablet Take 1 tablet (40 mg total) by  mouth daily.   diclofenac (VOLTAREN) 50 MG EC tablet Take 1 tablet (50 mg total) by mouth daily after breakfast.   dutasteride (AVODART) 0.5 MG capsule Take 1 capsule (0.5 mg total) by mouth daily.   lisinopril (ZESTRIL) 40 MG tablet Take 1 tablet (40 mg total) by mouth daily.   pioglitazone (ACTOS) 30 MG tablet Take 1 tablet (30 mg total) by mouth daily.   [DISCONTINUED] gabapentin (NEURONTIN) 100 MG capsule Take 1 capsule (100 mg total) by mouth at bedtime. (Patient not taking: Reported on 01/08/2021)   No facility-administered encounter medications on file as of 01/23/2021.     ROS: Pertinent positives and negatives noted in HPI. Remainder of ROS non-contributory    No Known Allergies  BP 130/70 (BP Location: Left Arm, Patient Position: Sitting, Cuff Size: Normal)   Pulse 83   Temp (!) 97.5 F (36.4 C) (Temporal)   Ht '5\' 8"'$  (1.727 m)   Wt 206 lb 9.6 oz (93.7 kg)   SpO2 97%   BMI 31.41 kg/m   Wt Readings from Last 3 Encounters:  01/23/21 206 lb 9.6 oz (93.7 kg)  01/08/21 204 lb (92.5 kg)  09/29/20 205 lb (93 kg)   Temp Readings from Last 3 Encounters:  01/23/21 (!) 97.5 F (36.4 C) (Temporal)  01/08/21 98 F (36.7 C) (Temporal)  06/18/20 97.9 F (36.6 C) (Temporal)   BP Readings from Last 3 Encounters:  01/23/21 130/70  01/08/21 126/82  09/29/20 138/88   Pulse Readings from Last 3 Encounters:  01/23/21 83  01/08/21 87  09/29/20 80     Physical Exam Constitutional:      General: He is not in acute distress.    Appearance: He is well-developed.  HENT:     Head: Normocephalic and atraumatic.     Right Ear: Tympanic membrane and ear canal normal.     Left Ear: Tympanic membrane and ear canal normal.     Nose: Nose normal.     Mouth/Throat:     Mouth: Mucous membranes are moist.     Pharynx: Oropharynx is clear.  Eyes:     Conjunctiva/sclera: Conjunctivae normal.  Neck:     Thyroid: No thyromegaly.  Cardiovascular:     Rate and Rhythm: Normal rate and regular rhythm.     Pulses: Normal pulses.  Pulmonary:     Effort: Pulmonary effort is normal.     Breath sounds: Normal breath sounds. No wheezing or rhonchi.  Abdominal:     General: Bowel sounds are normal. There is no distension.     Palpations: Abdomen is soft. There is no mass.     Tenderness: There is no abdominal tenderness. There is no guarding or rebound.  Musculoskeletal:     Cervical back: Neck supple.     Right lower leg: Right lower leg edema: trace B/L pedla edema.     Left lower leg: Edema present.  Lymphadenopathy:     Cervical: No cervical adenopathy.  Skin:    General: Skin is warm and dry.  Neurological:     Mental Status: He is alert and oriented to person, place, and time.     Motor: No abnormal muscle tone.     Coordination: Coordination normal.  Psychiatric:        Mood and Affect: Mood normal.        Behavior: Behavior normal.     A/P:  1. Essential hypertension -  controlled, at goal Refill: - amLODipine (NORVASC) 10 MG tablet; Take 1  tablet (10 mg total) by mouth daily.  Dispense: 90 tablet; Refill: 3 - lisinopril (ZESTRIL) 40 MG tablet; Take 1 tablet (40 mg total) by mouth daily.  Dispense: 90 tablet; Refill: 3  2. DM type 2 with diabetic mixed hyperlipidemia (HCC) - controlled, A2C = 6.8 Refill: - pioglitazone (ACTOS) 30 MG tablet; Take 1 tablet (30 mg total) by mouth daily.  Dispense: 90 tablet; Refill: 3 - atorvastatin (LIPITOR) 40 MG tablet; Take 1 tablet (40 mg total) by mouth daily.  Dispense: 90 tablet; Refill: 3 - f/u in 3 mo or sooner PRN  3. BPH with elevated PSA Refill: - dutasteride (AVODART) 0.5 MG capsule; Take 1 capsule (0.5 mg total) by mouth daily.  Dispense: 90 capsule; Refill: 3  4. Screening for colon cancer - overdue for colo, last one in [place] 14-15 years ago - Ambulatory referral to Gastroenterology  5. Squamous cell carcinoma of arm, right - Ambulatory referral to Dermatology  6. DDD (degenerative disc disease), lumbar - at baseline Refill: - diclofenac (VOLTAREN) 50 MG EC tablet; Take 1 tablet (50 mg total) by mouth daily after breakfast.  Dispense: 90 tablet; Refill: 1  7. Need for pneumococcal vaccination - Pneumococcal conjugate vaccine 13-valent IM     This visit occurred during the SARS-CoV-2 public health emergency.  Safety protocols were in place, including screening questions prior to the visit, additional usage of staff PPE, and extensive cleaning of exam room while observing appropriate contact time as indicated for disinfecting solutions.

## 2021-01-23 NOTE — Patient Instructions (Signed)
  [facility] [place], [place], [place] [address] [phone] [email or web address]  [facility] [address] [place], [place] [address] [phone] [email or web address] [place], [place], [place] [address] [phone]  [facility] [address] [place], [place], [place] [address] [phone]

## 2021-01-26 DIAGNOSIS — C801 Malignant (primary) neoplasm, unspecified: Secondary | ICD-10-CM | POA: Insufficient documentation

## 2021-01-26 DIAGNOSIS — E1159 Type 2 diabetes mellitus with other circulatory complications: Secondary | ICD-10-CM | POA: Insufficient documentation

## 2021-01-26 DIAGNOSIS — E119 Type 2 diabetes mellitus without complications: Secondary | ICD-10-CM | POA: Insufficient documentation

## 2021-01-29 ENCOUNTER — Encounter: Payer: Self-pay | Admitting: Gastroenterology

## 2021-02-04 ENCOUNTER — Telehealth: Payer: Self-pay | Admitting: Family Medicine

## 2021-02-04 NOTE — Telephone Encounter (Signed)
Pt is wanting a referral to [facility] Phone: [phone] for eye surgery. He is saying he has recently talked about this with Dr. Loletha Grayer. Please advise pt

## 2021-02-05 NOTE — Telephone Encounter (Signed)
Spoke to patient and he did call them.. he would like for [place] to wait to see if he can get an appointment to discuss this with one of the Eye doctors at [facility].   He will call us back if he needs anything else. Dm/cma

## 2021-02-06 ENCOUNTER — Telehealth: Payer: Self-pay | Admitting: Cardiology

## 2021-02-06 ENCOUNTER — Other Ambulatory Visit: Payer: Self-pay

## 2021-02-06 ENCOUNTER — Encounter: Payer: Self-pay | Admitting: Cardiology

## 2021-02-06 ENCOUNTER — Ambulatory Visit (INDEPENDENT_AMBULATORY_CARE_PROVIDER_SITE_OTHER): Payer: Medicare Other | Admitting: Cardiology

## 2021-02-06 VITALS — BP 132/80 | HR 79 | Ht 68.0 in | Wt 206.0 lb

## 2021-02-06 DIAGNOSIS — F17200 Nicotine dependence, unspecified, uncomplicated: Secondary | ICD-10-CM

## 2021-02-06 DIAGNOSIS — I10 Essential (primary) hypertension: Secondary | ICD-10-CM | POA: Diagnosis not present

## 2021-02-06 DIAGNOSIS — R600 Localized edema: Secondary | ICD-10-CM

## 2021-02-06 DIAGNOSIS — E785 Hyperlipidemia, unspecified: Secondary | ICD-10-CM

## 2021-02-06 DIAGNOSIS — R0600 Dyspnea, unspecified: Secondary | ICD-10-CM

## 2021-02-06 DIAGNOSIS — R0609 Other forms of dyspnea: Secondary | ICD-10-CM

## 2021-02-06 NOTE — Progress Notes (Signed)
Cardiology Office Note:    Date:  02/06/2021   ID:  Jesus Clayman Sr., DOB Nov 07, 1955, MRN [id number]  PCP:  Ronnald Nian, DO  Cardiologist:  None  Electrophysiologist:  None   Referring MD: Haydee Salter, MD   Chief Complaint  Patient presents with   RBBB   L Anterior Hemiblock    History of Present Illness:    Jesus MARES Sr. is a 65 y.o. male with a hx of HTN, DM2, HLD, tobacco use who presents for evaluation of bilateral lower extremity edema.  Symptoms started approximately 2 months ago. Reports he was doing more walking at the time his symptoms began. The swelling improves in the morning and worsens throughout the day. He endorses dyspnea on exertion as well. Gets short of breath when walking up an incline. He is limited in his physical exertion secondary to back pain. States he could probably not climb multiple flights of stairs without stopping due to both back pain and shortness of breath. He denies chest pain. Had mild chest tightness when he had COVID 2 months ago, but no chest pain since that time. No palpitations, dizziness, or syncope.   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  Currently smokes 1PPD, has smoked for many many years. Not particularly interested in quitting. Family history includes a sister with MI at age 17 and brother with CVA at age 27.    Past Medical History:  Diagnosis Date   Cancer ([place])    Diabetes mellitus without complication ([place])     Past Surgical History:  Procedure Laterality Date   APPENDECTOMY     65yo   MOHS SURGERY     SCC Rt arm   TONSILLECTOMY      Current Medications: Current Meds  Medication Sig   amLODipine (NORVASC) 10 MG tablet Take 1 tablet (10 mg total) by mouth daily.   atorvastatin (LIPITOR) 40 MG tablet Take 1 tablet (40 mg total) by mouth daily.   diclofenac (VOLTAREN) 50 MG EC tablet  Take 1 tablet (50 mg total) by mouth daily after breakfast.   dutasteride (AVODART) 0.5 MG capsule Take 1 capsule (0.5 mg total) by mouth daily.   lisinopril (ZESTRIL) 40 MG tablet Take 1 tablet (40 mg total) by mouth daily.   pioglitazone (ACTOS) 30 MG tablet Take 1 tablet (30 mg total) by mouth daily.     Allergies:   Patient has no known allergies.   Social History   Socioeconomic History   Marital status: Widowed    Spouse name: Not on file   Number of children: Not on file   Years of education: Not on file   Highest education level: Not on file  Occupational History   Not on file  Tobacco Use   Smoking status: Every Day    Packs/day: 1.00    Types: Cigarettes   Smokeless tobacco: Never  Vaping Use   Vaping Use: Never used  Substance and Sexual Activity   Alcohol use: Yes    Comment: Occas   Drug use: Never   Sexual activity: Not Currently  Other Topics Concern   Not on file  Social History Narrative   Not on file   Social Determinants of Health   Financial Resource Strain: Not on file  Food Insecurity: Not on file  Transportation Needs: Not on file  Physical Activity: Not on file  Stress: Not on file  Social Connections: Not on file     Family History: The patient's family history includes Arthritis in his mother; Cancer in his father; Hypertension in his father.  ROS:   Review of Systems  Constitution: Negative for decreased appetite, fever and weight gain.  HENT: Negative for congestion, ear discharge, hoarse voice and sore throat.   Eyes: Negative for discharge, redness, vision loss in right eye and visual halos.  Cardiovascular: Negative for chest pain, orthopnea and palpitations. Positive for dyspnea on exertion and leg swelling Respiratory: Negative for cough, hemoptysis Endocrine: Negative for heat intolerance and polyphagia.  Hematologic/Lymphatic: Negative for bleeding problem. Does not bruise/bleed easily.  Skin: Negative for flushing, nail  changes, rash and suspicious lesions.  Musculoskeletal: Negative for muscle cramps, myalgias, neck pain and stiffness.  Gastrointestinal: Negative for abdominal pain, bowel incontinence, diarrhea and excessive appetite.  Genitourinary: Negative for decreased libido, genital sores and incomplete emptying.  Neurological: Negative for brief paralysis, focal weakness, headaches and loss of balance.  Psychiatric/Behavioral: Negative for altered mental status, depression and suicidal ideas.  Allergic/Immunologic: Negative for HIV exposure and persistent infections.    EKGs/Labs/Other Studies Reviewed:    The following studies were reviewed today:   EKG:  The ekg ordered today demonstrates sinus rhythm at 78 bpm, right bundle branch block,   Recent Labs: 01/08/2021: ALT 20; BUN 8; Creatinine, Ser 0.77; Potassium 3.9; Sodium 134 01/23/2021: TSH 5.52  Recent Lipid Panel    Component Value Date/Time   CHOL 172 01/08/2021 1006   TRIG 115.0 01/08/2021 1006   HDL 49.60 01/08/2021 1006   CHOLHDL 3 01/08/2021 1006   VLDL 23.0 01/08/2021 1006   LDLCALC 99 01/08/2021 1006   LDLDIRECT 102.0 06/18/2020 1455    Physical Exam:    VS:  BP 132/80 (BP Location: Right Arm, Patient Position: Sitting)   Pulse 79   Ht '5\' 8"'$  (1.727 m)   Wt 206 lb (93.4 kg)   SpO2 94%   BMI 31.32 kg/m     Wt Readings from Last 3 Encounters:  02/06/21 206 lb (93.4 kg)  01/23/21 206 lb 9.6 oz (93.7 kg)  01/08/21 204 lb (92.5 kg)     GEN: Well nourished, well developed in no acute distress HEENT: Normal NECK: No JVD; No carotid bruits LYMPHATICS: No lymphadenopathy CARDIAC: S1S2 noted,RRR, no murmurs, rubs, gallops RESPIRATORY:  Clear to auscultation without rales, wheezing or rhonchi  ABDOMEN: Soft, non-tender, non-distended, +bowel sounds, no guarding. EXTREMITIES: 1+ pitting edema of bilateral lower extremities. No cyanosis, no clubbing MUSCULOSKELETAL:  No deformity  SKIN: Warm and dry NEUROLOGIC:  Alert and  oriented x 3, non-focal PSYCHIATRIC:  Normal affect, good insight  ASSESSMENT:    1. Hyperlipidemia, unspecified hyperlipidemia type   2. Bilateral leg edema   3. Hypertension, unspecified type   4. DOE (dyspnea on exertion)   5. Smoker    PLAN:     Dyspnea on exertion- patient with several risk factors for ischemic coronary disease including HTN, HLD, diabetes, EKG concerning for point for wall infarction and tobacco use. . Will schedule him for right/left heart cath given there is concern for possible PAH, this way we can evaluate right sided pressures and complete ischemic eval simultaneously.  Bilateral lower extremity edema- present x2 months, 1+ pitting edema on exam today. Obtain echocardiogram.  HTN- BP adequately controlled today. Counseled on limiting sodium intake. Continue current meds (Lisinopril '40mg'$ , Amlodipine '10mg'$ ) HLD- recent lipid panel shows total cholesterol 172, HDL 49,  LDL 99, triglycerides 115. Continue atorvastatin '40mg'$  daily.   The patient is in agreement with the above plan. The patient left the office in stable condition.  The patient will follow up in   Medication Adjustments/Labs and Tests Ordered: Current medicines are reviewed at length with the patient today.  Concerns regarding medicines are outlined above.  Orders Placed This Encounter  Procedures   Basic metabolic panel   Magnesium   CBC with Differential/Platelet   EKG 12-Lead   ECHOCARDIOGRAM COMPLETE    No orders of the defined types were placed in this encounter.   Patient Instructions  Medication Instructions:  Your physician recommends that you continue on your current medications as directed. Please refer to the Current Medication list given to you today.  *If you need a refill on your cardiac medications before your next appointment, please call your pharmacy*   Lab Work: Your physician recommends that you return for lab work in:  Before Cath: BMET, Mag, CBC If you have labs  (blood work) drawn today and your tests are completely normal, you will receive your results only by: MyChart Message (if you have MyChart) OR A paper copy in the mail If you have any lab test that is abnormal or we need to change your treatment, we will call you to review the results.   Testing/Procedures: Your physician has requested that you have an echocardiogram. Echocardiography is a painless test that uses sound waves to create images of your heart. It provides your doctor with information about the size and shape of your heart and how well your heart's chambers and valves are working. This procedure takes approximately one hour. There are no restrictions for this procedure.   [place] [address], [place] [address] Dept: [phone] Loc: [place] Sr.  02/06/2021   1. Please arrive at the [facility] (Main Entrance A) at [facility]: [address] [place], [place] [address] at (This time is two hours before your procedure to ensure your preparation). Free valet parking service is available.   Special note: Every effort is made to have your procedure done on time. Please understand that emergencies sometimes delay scheduled procedures.  2. Diet: Do not eat solid foods after midnight.  The patient may have clear liquids until 5am upon the day of the procedure.  3. Labs: You will need to have blood drawn before your cath, TBD.   4. Medication instructions in preparation for your procedure:   Contrast Allergy: No  On the morning of your procedure, take your Aspirin and any morning medicines NOT listed above.  You may use sips of water.  5. Plan for one night stay--bring personal belongings. 6. Bring a current list of your medications and current insurance cards. 7. You MUST have a responsible person to drive you home. 8. Someone MUST be with you the  first 24 hours after you arrive home or your discharge will be delayed. 9. Please wear clothes that are easy to get on and off and wear slip-on shoes.  Thank you for allowing [place] to care for you!   -- [facility]    Follow-Up: At [facility], you and your health needs are our priority.  As part of our continuing mission to provide you with exceptional heart care, we have created designated Provider Care Teams.  These Care Teams include your primary Cardiologist (  physician) and Advanced Practice Providers (APPs -  Physician Assistants and Nurse Practitioners) who all work together to provide you with the care you need, when you need it.  We recommend signing up for the patient portal called "MyChart".  Sign up information is provided on this After Visit Summary.  MyChart is used to connect with patients for Virtual Visits (Telemedicine).  Patients are able to view lab/test results, encounter notes, upcoming appointments, etc.  Non-urgent messages can be sent to your provider as well.   To learn more about what you can do with MyChart, go to [email or web address].    Your next appointment:   4-6 week(s)  The format for your next appointment:   In Person  Provider:   Berniece Salines, DO   Other Instructions Echocardiogram An echocardiogram is a test that uses sound waves (ultrasound) to produce images of the heart. Images from an echocardiogram can provide important information about: Heart size and shape. The size and thickness and movement of your heart's walls. Heart muscle function and strength. Heart valve function or if you have stenosis. Stenosis is when the heart valves are too narrow. If blood is flowing backward through the heart valves (regurgitation). A tumor or infectious growth around the heart valves. Areas of heart muscle that are not working well because of poor blood flow or injury from a heart attack. Aneurysm detection. An aneurysm  is a weak or damaged part of an artery wall. The wall bulges out from the normal force of blood pumping through the body. Tell a health care provider about: Any allergies you have. All medicines you are taking, including vitamins, herbs, eye drops, creams, and over-the-counter medicines. Any blood disorders you have. Any surgeries you have had. Any medical conditions you have. Whether you are pregnant or may be pregnant. What are the risks? Generally, this is a safe test. However, problems may occur, including an allergic reaction to dye (contrast) that may be used during the test. What happens before the test? No specific preparation is needed. You may eat and drink normally. What happens during the test?  You will take off your clothes from the waist up and put on a hospital gown. Electrodes or electrocardiogram (ECG)patches may be placed on your chest. The electrodes or patches are then connected to a device that monitors your heart rate and rhythm. You will lie down on a table for an ultrasound exam. A gel will be applied to your chest to help sound waves pass through your skin. A handheld device, called a transducer, will be pressed against your chest and moved over your heart. The transducer produces sound waves that travel to your heart and bounce back (or "echo" back) to the transducer. These sound waves will be captured in real-time and changed into images of your heart that can be viewed on a video monitor. The images will be recorded on a computer and reviewed by your health care provider. You may be asked to change positions or hold your breath for a short time. This makes it easier to get different views or better views of your heart. In some cases, you may receive contrast through an IV in one of your veins. This can improve the quality of the pictures from your heart. The procedure may vary among health care providers and hospitals. What can I expect after the test? You may  return to your normal, everyday life, including diet, activities, andmedicines, unless your health care provider tells you not to do  that. Follow these instructions at home: It is up to you to get the results of your test. Ask your health care provider, or the department that is doing the test, when your results will be ready. Keep all follow-up visits. This is important. Summary An echocardiogram is a test that uses sound waves (ultrasound) to produce images of the heart. Images from an echocardiogram can provide important information about the size and shape of your heart, heart muscle function, heart valve function, and other possible heart problems. You do not need to do anything to prepare before this test. You may eat and drink normally. After the echocardiogram is completed, you may return to your normal, everyday life, unless your health care provider tells you not to do that. This information is not intended to replace advice given to you by your health care provider. Make sure you discuss any questions you have with your healthcare provider. Document Revised: 02/12/2020 Document Reviewed: 02/12/2020 Elsevier Patient Education  2022 [place].    Adopting a Healthy Lifestyle.  Know what a healthy weight is for you (roughly BMI <25) and aim to maintain this   Aim for 7+ servings of fruits and vegetables daily   65-80+ fluid ounces of water or unsweet tea for healthy kidneys   Limit to max 1 drink of alcohol per day; avoid smoking/tobacco   Limit animal fats in diet for cholesterol and heart health - choose grass fed whenever available   Avoid highly processed foods, and foods high in saturated/trans fats   Aim for low stress - take time to unwind and care for your mental health   Aim for 150 min of moderate intensity exercise weekly for heart health, and weights twice weekly for bone health   Aim for 7-9 hours of sleep daily   When it comes to diets, agreement about the  perfect plan isnt easy to find, even among the experts. Experts at the [employer] developed an idea known as the Healthy Eating Plate. Just imagine a plate divided into logical, healthy portions.   The emphasis is on diet quality:   Load up on vegetables and fruits - one-half of your plate: Aim for color and variety, and remember that potatoes dont count.   Go for whole grains - one-quarter of your plate: Whole wheat, barley, wheat berries, quinoa, oats, brown rice, and foods made with them. If you want pasta, go with whole wheat pasta.   Protein power - one-quarter of your plate: Fish, chicken, beans, and nuts are all healthy, versatile protein sources. Limit red meat.   The diet, however, does go beyond the plate, offering a few other suggestions.   Use healthy plant oils, such as olive, canola, soy, corn, sunflower and peanut. Check the labels, and avoid partially hydrogenated oil, which have unhealthy trans fats.   If youre thirsty, drink water. Coffee and tea are good in moderation, but skip sugary drinks and limit milk and dairy products to one or two daily servings.   The type of carbohydrate in the diet is more important than the amount. Some sources of carbohydrates, such as vegetables, fruits, whole grains, and beans-are healthier than others.   Finally, stay active  Signed, Berniece Salines, DO  02/06/2021 4:21 PM    [facility]

## 2021-02-06 NOTE — Telephone Encounter (Signed)
Pt following up on scheduling a Heart Cath [phone]

## 2021-02-06 NOTE — Telephone Encounter (Signed)
Called patient to set up his Cath appointment. E gave dates he was able to do the cath. Appointment made. Patient made aware of his arrival time. Verbalized understanding. No questions or concerns expressed at this time.

## 2021-02-06 NOTE — Telephone Encounter (Signed)
  Patient would like to give dates that he could do the heart cath and get it scheduled. Please call

## 2021-02-06 NOTE — Patient Instructions (Signed)
Medication Instructions:  Your physician recommends that you continue on your current medications as directed. Please refer to the Current Medication list given to you today.  *If you need a refill on your cardiac medications before your next appointment, please call your pharmacy*   Lab Work: Your physician recommends that you return for lab work in:  Before Cath: BMET, Mag, CBC If you have labs (blood work) drawn today and your tests are completely normal, you will receive your results only by: MyChart Message (if you have MyChart) OR A paper copy in the mail If you have any lab test that is abnormal or we need to change your treatment, we will call you to review the results.   Testing/Procedures: Your physician has requested that you have an echocardiogram. Echocardiography is a painless test that uses sound waves to create images of your heart. It provides your doctor with information about the size and shape of your heart and how well your heart's chambers and valves are working. This procedure takes approximately one hour. There are no restrictions for this procedure.   [place] [address], [place] [address] Dept: [phone] Loc: [facility] [facility]  02/06/2021   1. Please arrive at the [facility] (Main Entrance A) at [facility]: [address] [place], [place] [address] at (This time is two hours before your procedure to ensure your preparation). Free valet parking service is available.   Special note: Every effort is made to have your procedure done on time. Please understand that emergencies sometimes delay scheduled procedures.  2. Diet: Do not eat solid foods after midnight.  The patient may have clear liquids until 5am upon the day of the procedure.  3. Labs: You will need to have blood drawn before your cath, TBD.   4. Medication instructions  in preparation for your procedure:   Contrast Allergy: No  On the morning of your procedure, take your Aspirin and any morning medicines NOT listed above.  You may use sips of water.  5. Plan for one night stay--bring personal belongings. 6. Bring a current list of your medications and current insurance cards. 7. You MUST have a responsible person to drive you home. 8. Someone MUST be with you the first 24 hours after you arrive home or your discharge will be delayed. 9. Please wear clothes that are easy to get on and off and wear slip-on shoes.  Thank you for allowing [place] to care for you!   -- [facility]    Follow-Up: At [facility], you and your health needs are our priority.  As part of our continuing mission to provide you with exceptional heart care, we have created designated Provider Care Teams.  These Care Teams include your primary Cardiologist (physician) and Advanced Practice Providers (APPs -  Physician Assistants and Nurse Practitioners) who all work together to provide you with the care you need, when you need it.  We recommend signing up for the patient portal called "MyChart".  Sign up information is provided on this After Visit Summary.  MyChart is used to connect with patients for Virtual Visits (Telemedicine).  Patients are able to view lab/test results, encounter notes, upcoming appointments, etc.  Non-urgent messages can be sent to your provider as well.   To learn more about what you can do with MyChart, go to [email or web address].    Your  next appointment:   4-6 week(s)  The format for your next appointment:   In Person  Provider:   Berniece Salines, DO   Other Instructions Echocardiogram An echocardiogram is a test that uses sound waves (ultrasound) to produce images of the heart. Images from an echocardiogram can provide important information about: Heart size and shape. The size and thickness and movement of your  heart's walls. Heart muscle function and strength. Heart valve function or if you have stenosis. Stenosis is when the heart valves are too narrow. If blood is flowing backward through the heart valves (regurgitation). A tumor or infectious growth around the heart valves. Areas of heart muscle that are not working well because of poor blood flow or injury from a heart attack. Aneurysm detection. An aneurysm is a weak or damaged part of an artery wall. The wall bulges out from the normal force of blood pumping through the body. Tell a health care provider about: Any allergies you have. All medicines you are taking, including vitamins, herbs, eye drops, creams, and over-the-counter medicines. Any blood disorders you have. Any surgeries you have had. Any medical conditions you have. Whether you are pregnant or may be pregnant. What are the risks? Generally, this is a safe test. However, problems may occur, including an allergic reaction to dye (contrast) that may be used during the test. What happens before the test? No specific preparation is needed. You may eat and drink normally. What happens during the test?  You will take off your clothes from the waist up and put on a hospital gown. Electrodes or electrocardiogram (ECG)patches may be placed on your chest. The electrodes or patches are then connected to a device that monitors your heart rate and rhythm. You will lie down on a table for an ultrasound exam. A gel will be applied to your chest to help sound waves pass through your skin. A handheld device, called a transducer, will be pressed against your chest and moved over your heart. The transducer produces sound waves that travel to your heart and bounce back (or "echo" back) to the transducer. These sound waves will be captured in real-time and changed into images of your heart that can be viewed on a video monitor. The images will be recorded on a computer and reviewed by your health care  provider. You may be asked to change positions or hold your breath for a short time. This makes it easier to get different views or better views of your heart. In some cases, you may receive contrast through an IV in one of your veins. This can improve the quality of the pictures from your heart. The procedure may vary among health care providers and hospitals. What can I expect after the test? You may return to your normal, everyday life, including diet, activities, andmedicines, unless your health care provider tells you not to do that. Follow these instructions at home: It is up to you to get the results of your test. Ask your health care provider, or the department that is doing the test, when your results will be ready. Keep all follow-up visits. This is important. Summary An echocardiogram is a test that uses sound waves (ultrasound) to produce images of the heart. Images from an echocardiogram can provide important information about the size and shape of your heart, heart muscle function, heart valve function, and other possible heart problems. You do not need to do anything to prepare before this test. You may eat and drink normally.  After the echocardiogram is completed, you may return to your normal, everyday life, unless your health care provider tells you not to do that. This information is not intended to replace advice given to you by your health care provider. Make sure you discuss any questions you have with your healthcare provider. Document Revised: 02/12/2020 Document Reviewed: 02/12/2020 Elsevier Patient Education  2022 [employer].

## 2021-02-06 NOTE — H&P (View-Only) (Signed)
Cardiology Office Note:    Date:  02/06/2021   ID:  Jesus Clayman Sr., DOB 07-May-1956, MRN [id number]  PCP:  Ronnald Nian, DO  Cardiologist:  None  Electrophysiologist:  None   Referring MD: Haydee Salter, MD   Chief Complaint  Patient presents with   RBBB   L Anterior Hemiblock    History of Present Illness:    [name] is a 65 y.o. male with a hx of HTN, DM2, HLD, tobacco use who presents for evaluation of bilateral lower extremity edema.  Symptoms started approximately 2 months ago. Reports he was doing more walking at the time his symptoms began. The swelling improves in the morning and worsens throughout the day. He endorses dyspnea on exertion as well. Gets short of breath when walking up an incline. He is limited in his physical exertion secondary to back pain. States he could probably not climb multiple flights of stairs without stopping due to both back pain and shortness of breath. He denies chest pain. Had mild chest tightness when he had COVID 2 months ago, but no chest pain since that time. No palpitations, dizziness, or syncope.   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  Currently smokes 1PPD, has smoked for many many years. Not particularly interested in quitting. Family history includes a sister with MI at age 25 and brother with CVA at age 50.    Past Medical History:  Diagnosis Date   Cancer ([place])    Diabetes mellitus without complication ([place])     Past Surgical History:  Procedure Laterality Date   APPENDECTOMY     65yo   MOHS SURGERY     SCC Rt arm   TONSILLECTOMY      Current Medications: Current Meds  Medication Sig   amLODipine (NORVASC) 10 MG tablet Take 1 tablet (10 mg total) by mouth daily.   atorvastatin (LIPITOR) 40 MG tablet Take 1 tablet (40 mg total) by mouth daily.   diclofenac (VOLTAREN) 50 MG EC tablet  Take 1 tablet (50 mg total) by mouth daily after breakfast.   dutasteride (AVODART) 0.5 MG capsule Take 1 capsule (0.5 mg total) by mouth daily.   lisinopril (ZESTRIL) 40 MG tablet Take 1 tablet (40 mg total) by mouth daily.   pioglitazone (ACTOS) 30 MG tablet Take 1 tablet (30 mg total) by mouth daily.     Allergies:   Patient has no known allergies.   Social History   Socioeconomic History   Marital status: Widowed    Spouse name: Not on file   Number of children: Not on file   Years of education: Not on file   Highest education level: Not on file  Occupational History   Not on file  Tobacco Use   Smoking status: Every Day    Packs/day: 1.00    Types: Cigarettes   Smokeless tobacco: Never  Vaping Use   Vaping Use: Never used  Substance and Sexual Activity   Alcohol use: Yes    Comment: Occas   Drug use: Never   Sexual activity: Not Currently  Other Topics Concern   Not on file  Social History Narrative   Not on file   Social Determinants of Health   Financial Resource Strain: Not on file  Food Insecurity: Not on file  Transportation Needs: Not on file  Physical Activity: Not on file  Stress: Not on file  Social Connections: Not on file     Family History: The patient's family history includes Arthritis in his mother; Cancer in his father; Hypertension in his father.  ROS:   Review of Systems  Constitution: Negative for decreased appetite, fever and weight gain.  HENT: Negative for congestion, ear discharge, hoarse voice and sore throat.   Eyes: Negative for discharge, redness, vision loss in right eye and visual halos.  Cardiovascular: Negative for chest pain, orthopnea and palpitations. Positive for dyspnea on exertion and leg swelling Respiratory: Negative for cough, hemoptysis Endocrine: Negative for heat intolerance and polyphagia.  Hematologic/Lymphatic: Negative for bleeding problem. Does not bruise/bleed easily.  Skin: Negative for flushing, nail  changes, rash and suspicious lesions.  Musculoskeletal: Negative for muscle cramps, myalgias, neck pain and stiffness.  Gastrointestinal: Negative for abdominal pain, bowel incontinence, diarrhea and excessive appetite.  Genitourinary: Negative for decreased libido, genital sores and incomplete emptying.  Neurological: Negative for brief paralysis, focal weakness, headaches and loss of balance.  Psychiatric/Behavioral: Negative for altered mental status, depression and suicidal ideas.  Allergic/Immunologic: Negative for HIV exposure and persistent infections.    EKGs/Labs/Other Studies Reviewed:    The following studies were reviewed today:   EKG:  The ekg ordered today demonstrates sinus rhythm at 78 bpm, right bundle branch block,   Recent Labs: 01/08/2021: ALT 20; BUN 8; Creatinine, Ser 0.77; Potassium 3.9; Sodium 134 01/23/2021: TSH 5.52  Recent Lipid Panel    Component Value Date/Time   CHOL 172 01/08/2021 1006   TRIG 115.0 01/08/2021 1006   HDL 49.60 01/08/2021 1006   CHOLHDL 3 01/08/2021 1006   VLDL 23.0 01/08/2021 1006   LDLCALC 99 01/08/2021 1006   LDLDIRECT 102.0 06/18/2020 1455    Physical Exam:    VS:  BP 132/80 (BP Location: Right Arm, Patient Position: Sitting)   Pulse 79   Ht '5\' 8"'$  (1.727 m)   Wt 206 lb (93.4 kg)   SpO2 94%   BMI 31.32 kg/m     Wt Readings from Last 3 Encounters:  02/06/21 206 lb (93.4 kg)  01/23/21 206 lb 9.6 oz (93.7 kg)  01/08/21 204 lb (92.5 kg)     GEN: Well nourished, well developed in no acute distress HEENT: Normal NECK: No JVD; No carotid bruits LYMPHATICS: No lymphadenopathy CARDIAC: S1S2 noted,RRR, no murmurs, rubs, gallops RESPIRATORY:  Clear to auscultation without rales, wheezing or rhonchi  ABDOMEN: Soft, non-tender, non-distended, +bowel sounds, no guarding. EXTREMITIES: 1+ pitting edema of bilateral lower extremities. No cyanosis, no clubbing MUSCULOSKELETAL:  No deformity  SKIN: Warm and dry NEUROLOGIC:  Alert and  oriented x 3, non-focal PSYCHIATRIC:  Normal affect, good insight  ASSESSMENT:    1. Hyperlipidemia, unspecified hyperlipidemia type   2. Bilateral leg edema   3. Hypertension, unspecified type   4. DOE (dyspnea on exertion)   5. Smoker    PLAN:     Dyspnea on exertion- patient with several risk factors for ischemic coronary disease including HTN, HLD, diabetes, EKG concerning for point for wall infarction and tobacco use. . Will schedule him for right/left heart cath given there is concern for possible PAH, this way we can evaluate right sided pressures and complete ischemic eval simultaneously.  Bilateral lower extremity edema- present x2 months, 1+ pitting edema on exam today. Obtain echocardiogram.  HTN- BP adequately controlled today. Counseled on limiting sodium intake. Continue current meds (Lisinopril '40mg'$ , Amlodipine '10mg'$ ) HLD- recent lipid panel shows total cholesterol 172, HDL 49,  LDL 99, triglycerides 115. Continue atorvastatin '40mg'$  daily.   The patient is in agreement with the above plan. The patient left the office in stable condition.  The patient will follow up in   Medication Adjustments/Labs and Tests Ordered: Current medicines are reviewed at length with the patient today.  Concerns regarding medicines are outlined above.  Orders Placed This Encounter  Procedures   Basic metabolic panel   Magnesium   CBC with Differential/Platelet   EKG 12-Lead   ECHOCARDIOGRAM COMPLETE    No orders of the defined types were placed in this encounter.   Patient Instructions  Medication Instructions:  Your physician recommends that you continue on your current medications as directed. Please refer to the Current Medication list given to you today.  *If you need a refill on your cardiac medications before your next appointment, please call your pharmacy*   Lab Work: Your physician recommends that you return for lab work in:  Before Cath: BMET, Mag, CBC If you have labs  (blood work) drawn today and your tests are completely normal, you will receive your results only by: MyChart Message (if you have MyChart) OR A paper copy in the mail If you have any lab test that is abnormal or we need to change your treatment, we will call you to review the results.   Testing/Procedures: Your physician has requested that you have an echocardiogram. Echocardiography is a painless test that uses sound waves to create images of your heart. It provides your doctor with information about the size and shape of your heart and how well your heart's chambers and valves are working. This procedure takes approximately one hour. There are no restrictions for this procedure.   [place], [address] [place] [address] Dept: [phone] Loc: [place] [facility]  02/06/2021   1. Please arrive at the [facility] (Main Entrance A) at [facility]: [address] [place], [place] [address] at (This time is two hours before your procedure to ensure your preparation). Free valet parking service is available.   Special note: Every effort is made to have your procedure done on time. Please understand that emergencies sometimes delay scheduled procedures.  2. Diet: Do not eat solid foods after midnight.  The patient may have clear liquids until 5am upon the day of the procedure.  3. Labs: You will need to have blood drawn before your cath, TBD.   4. Medication instructions in preparation for your procedure:   Contrast Allergy: No  On the morning of your procedure, take your Aspirin and any morning medicines NOT listed above.  You may use sips of water.  5. Plan for one night stay--bring personal belongings. 6. Bring a current list of your medications and current insurance cards. 7. You MUST have a responsible person to drive you home. 8. Someone MUST be with you the  first 24 hours after you arrive home or your discharge will be delayed. 9. Please wear clothes that are easy to get on and off and wear slip-on shoes.  Thank you for allowing [place] to care for you!   -- [facility]    Follow-Up: At [facility], you and your health needs are our priority.  As part of our continuing mission to provide you with exceptional heart care, we have created designated Provider Care Teams.  These Care Teams include your primary Cardiologist (  physician) and Advanced Practice Providers (APPs -  Physician Assistants and Nurse Practitioners) who all work together to provide you with the care you need, when you need it.  We recommend signing up for the patient portal called "MyChart".  Sign up information is provided on this After Visit Summary.  MyChart is used to connect with patients for Virtual Visits (Telemedicine).  Patients are able to view lab/test results, encounter notes, upcoming appointments, etc.  Non-urgent messages can be sent to your provider as well.   To learn more about what you can do with MyChart, go to [email or web address].    Your next appointment:   4-6 week(s)  The format for your next appointment:   In Person  Provider:   Berniece Salines, DO   Other Instructions Echocardiogram An echocardiogram is a test that uses sound waves (ultrasound) to produce images of the heart. Images from an echocardiogram can provide important information about: Heart size and shape. The size and thickness and movement of your heart's walls. Heart muscle function and strength. Heart valve function or if you have stenosis. Stenosis is when the heart valves are too narrow. If blood is flowing backward through the heart valves (regurgitation). A tumor or infectious growth around the heart valves. Areas of heart muscle that are not working well because of poor blood flow or injury from a heart attack. Aneurysm detection. An aneurysm  is a weak or damaged part of an artery wall. The wall bulges out from the normal force of blood pumping through the body. Tell a health care provider about: Any allergies you have. All medicines you are taking, including vitamins, herbs, eye drops, creams, and over-the-counter medicines. Any blood disorders you have. Any surgeries you have had. Any medical conditions you have. Whether you are pregnant or may be pregnant. What are the risks? Generally, this is a safe test. However, problems may occur, including an allergic reaction to dye (contrast) that may be used during the test. What happens before the test? No specific preparation is needed. You may eat and drink normally. What happens during the test?  You will take off your clothes from the waist up and put on a hospital gown. Electrodes or electrocardiogram (ECG)patches may be placed on your chest. The electrodes or patches are then connected to a device that monitors your heart rate and rhythm. You will lie down on a table for an ultrasound exam. A gel will be applied to your chest to help sound waves pass through your skin. A handheld device, called a transducer, will be pressed against your chest and moved over your heart. The transducer produces sound waves that travel to your heart and bounce back (or "echo" back) to the transducer. These sound waves will be captured in real-time and changed into images of your heart that can be viewed on a video monitor. The images will be recorded on a computer and reviewed by your health care provider. You may be asked to change positions or hold your breath for a short time. This makes it easier to get different views or better views of your heart. In some cases, you may receive contrast through an IV in one of your veins. This can improve the quality of the pictures from your heart. The procedure may vary among health care providers and hospitals. What can I expect after the test? You may  return to your normal, everyday life, including diet, activities, andmedicines, unless your health care provider tells you not to do  that. Follow these instructions at home: It is up to you to get the results of your test. Ask your health care provider, or the department that is doing the test, when your results will be ready. Keep all follow-up visits. This is important. Summary An echocardiogram is a test that uses sound waves (ultrasound) to produce images of the heart. Images from an echocardiogram can provide important information about the size and shape of your heart, heart muscle function, heart valve function, and other possible heart problems. You do not need to do anything to prepare before this test. You may eat and drink normally. After the echocardiogram is completed, you may return to your normal, everyday life, unless your health care provider tells you not to do that. This information is not intended to replace advice given to you by your health care provider. Make sure you discuss any questions you have with your healthcare provider. Document Revised: 02/12/2020 Document Reviewed: 02/12/2020 Elsevier Patient Education  2022 [place].    Adopting a Healthy Lifestyle.  Know what a healthy weight is for you (roughly BMI <25) and aim to maintain this   Aim for 7+ servings of fruits and vegetables daily   65-80+ fluid ounces of water or unsweet tea for healthy kidneys   Limit to max 1 drink of alcohol per day; avoid smoking/tobacco   Limit animal fats in diet for cholesterol and heart health - choose grass fed whenever available   Avoid highly processed foods, and foods high in saturated/trans fats   Aim for low stress - take time to unwind and care for your mental health   Aim for 150 min of moderate intensity exercise weekly for heart health, and weights twice weekly for bone health   Aim for 7-9 hours of sleep daily   When it comes to diets, agreement about the  perfect plan isnt easy to find, even among the experts. Experts at the [place] developed an idea known as the Healthy Eating Plate. Just imagine a plate divided into logical, healthy portions.   The emphasis is on diet quality:   Load up on vegetables and fruits - one-half of your plate: Aim for color and variety, and remember that potatoes dont count.   Go for whole grains - one-quarter of your plate: Whole wheat, barley, wheat berries, quinoa, oats, brown rice, and foods made with them. If you want pasta, go with whole wheat pasta.   Protein power - one-quarter of your plate: Fish, chicken, beans, and nuts are all healthy, versatile protein sources. Limit red meat.   The diet, however, does go beyond the plate, offering a few other suggestions.   Use healthy plant oils, such as olive, canola, soy, corn, sunflower and peanut. Check the labels, and avoid partially hydrogenated oil, which have unhealthy trans fats.   If youre thirsty, drink water. Coffee and tea are good in moderation, but skip sugary drinks and limit milk and dairy products to one or two daily servings.   The type of carbohydrate in the diet is more important than the amount. Some sources of carbohydrates, such as vegetables, fruits, whole grains, and beans-are healthier than others.   Finally, stay active  Signed, Berniece Salines, DO  02/06/2021 4:21 PM    [facility]

## 2021-02-10 ENCOUNTER — Telehealth: Payer: Self-pay | Admitting: Cardiology

## 2021-02-11 LAB — CBC WITH DIFFERENTIAL/PLATELET
Basophils Absolute: 0.1 10*3/uL (ref 0.0–0.2)
Basos: 1 %
EOS (ABSOLUTE): 0.1 10*3/uL (ref 0.0–0.4)
Eos: 1 %
Hematocrit: 52.5 % — ABNORMAL HIGH (ref 37.5–51.0)
Hemoglobin: 17.8 g/dL — ABNORMAL HIGH (ref 13.0–17.7)
Immature Grans (Abs): 0 10*3/uL (ref 0.0–0.1)
Immature Granulocytes: 0 %
Lymphocytes Absolute: 2.2 10*3/uL (ref 0.7–3.1)
Lymphs: 24 %
MCH: 30.2 pg (ref 26.6–33.0)
MCHC: 33.9 g/dL (ref 31.5–35.7)
MCV: 89 fL (ref 79–97)
Monocytes Absolute: 0.6 10*3/uL (ref 0.1–0.9)
Monocytes: 6 %
Neutrophils Absolute: 6.1 10*3/uL (ref 1.4–7.0)
Neutrophils: 68 %
Platelets: 235 10*3/uL (ref 150–450)
RBC: 5.9 x10E6/uL — ABNORMAL HIGH (ref 4.14–5.80)
RDW: 13.7 % (ref 11.6–15.4)
WBC: 9 10*3/uL (ref 3.4–10.8)

## 2021-02-11 LAB — BASIC METABOLIC PANEL
BUN/Creatinine Ratio: 11 (ref 10–24)
BUN: 9 mg/dL (ref 8–27)
CO2: 22 mmol/L (ref 20–29)
Calcium: 9.4 mg/dL (ref 8.6–10.2)
Chloride: 100 mmol/L (ref 96–106)
Creatinine, Ser: 0.81 mg/dL (ref 0.76–1.27)
Glucose: 127 mg/dL — ABNORMAL HIGH (ref 65–99)
Potassium: 4.1 mmol/L (ref 3.5–5.2)
Sodium: 136 mmol/L (ref 134–144)
eGFR: 97 mL/min/{1.73_m2} (ref >59)

## 2021-02-11 LAB — MAGNESIUM: Magnesium: 2 mg/dL (ref 1.6–2.3)

## 2021-02-12 ENCOUNTER — Telehealth: Payer: Self-pay | Admitting: *Deleted

## 2021-02-12 NOTE — Telephone Encounter (Signed)
Cardiac catheterization scheduled at [facility] for: Friday February 13, 2021 [facility] Main Entrance A [facility]) at: 10 AM   No solid food after midnight prior to cath, clear liquids until 5 AM day of procedure.  Medication instructions: Hold: -Actos-AM of procedure Except hold medications morning medications can be taken pre-cath with sips of water including aspirin 81 mg.    Confirmed patient has responsible adult to drive home post procedure and be with patient first 24 hours after arriving home.  Patients are allowed one visitor in the waiting room during the time they are at the hospital for their procedure. Both patient and visitor must wear a mask once they enter the hospital.   Patient reports does not currently have any symptoms concerning for COVID-19 and no household members with COVID-19 like illness.      Reviewed procedure/mask/visitor instructions with patient.

## 2021-02-12 NOTE — Telephone Encounter (Signed)
Spoke with patient , questions answered

## 2021-02-12 NOTE — Telephone Encounter (Signed)
Patient is returning call to discuss procedure instruction.

## 2021-02-13 ENCOUNTER — Inpatient Hospital Stay (HOSPITAL_COMMUNITY): Payer: Medicare Other

## 2021-02-13 ENCOUNTER — Encounter (HOSPITAL_COMMUNITY): Payer: Self-pay | Admitting: Interventional Cardiology

## 2021-02-13 ENCOUNTER — Encounter (HOSPITAL_COMMUNITY)
Admission: RE | Disposition: A | Discharge status: Home / Self Care | Payer: Self-pay | Source: Home / Self Care | Attending: Thoracic Surgery (Cardiothoracic Vascular Surgery)

## 2021-02-13 ENCOUNTER — Inpatient Hospital Stay (HOSPITAL_COMMUNITY)
Admission: RE | Admit: 2021-02-13 | Discharge: 2021-02-23 | DRG: 234 | Disposition: A | Discharge status: Home / Self Care | Payer: Medicare Other | Attending: Thoracic Surgery (Cardiothoracic Vascular Surgery) | Admitting: Thoracic Surgery (Cardiothoracic Vascular Surgery)

## 2021-02-13 ENCOUNTER — Other Ambulatory Visit: Payer: Self-pay

## 2021-02-13 DIAGNOSIS — Z7982 Long term (current) use of aspirin: Secondary | ICD-10-CM

## 2021-02-13 DIAGNOSIS — Z01811 Encounter for preprocedural respiratory examination: Secondary | ICD-10-CM

## 2021-02-13 DIAGNOSIS — I2510 Atherosclerotic heart disease of native coronary artery without angina pectoris: Secondary | ICD-10-CM | POA: Diagnosis present

## 2021-02-13 DIAGNOSIS — I25110 Atherosclerotic heart disease of native coronary artery with unstable angina pectoris: Secondary | ICD-10-CM | POA: Diagnosis present

## 2021-02-13 DIAGNOSIS — Z823 Family history of stroke: Secondary | ICD-10-CM | POA: Diagnosis not present

## 2021-02-13 DIAGNOSIS — I200 Unstable angina: Secondary | ICD-10-CM

## 2021-02-13 DIAGNOSIS — I700 Atherosclerosis of aorta: Secondary | ICD-10-CM | POA: Diagnosis present

## 2021-02-13 DIAGNOSIS — Z809 Family history of malignant neoplasm, unspecified: Secondary | ICD-10-CM | POA: Diagnosis not present

## 2021-02-13 DIAGNOSIS — Z8261 Family history of arthritis: Secondary | ICD-10-CM | POA: Diagnosis not present

## 2021-02-13 DIAGNOSIS — Z8616 Personal history of COVID-19: Secondary | ICD-10-CM | POA: Diagnosis not present

## 2021-02-13 DIAGNOSIS — I5043 Acute on chronic combined systolic (congestive) and diastolic (congestive) heart failure: Secondary | ICD-10-CM | POA: Insufficient documentation

## 2021-02-13 DIAGNOSIS — Z01810 Encounter for preprocedural cardiovascular examination: Secondary | ICD-10-CM | POA: Diagnosis not present

## 2021-02-13 DIAGNOSIS — E785 Hyperlipidemia, unspecified: Secondary | ICD-10-CM | POA: Diagnosis present

## 2021-02-13 DIAGNOSIS — Z79899 Other long term (current) drug therapy: Secondary | ICD-10-CM | POA: Diagnosis not present

## 2021-02-13 DIAGNOSIS — I351 Nonrheumatic aortic (valve) insufficiency: Secondary | ICD-10-CM | POA: Diagnosis present

## 2021-02-13 DIAGNOSIS — D62 Acute posthemorrhagic anemia: Secondary | ICD-10-CM | POA: Diagnosis not present

## 2021-02-13 DIAGNOSIS — F17210 Nicotine dependence, cigarettes, uncomplicated: Secondary | ICD-10-CM | POA: Diagnosis present

## 2021-02-13 DIAGNOSIS — I712 Thoracic aortic aneurysm, without rupture: Secondary | ICD-10-CM | POA: Diagnosis present

## 2021-02-13 DIAGNOSIS — J9811 Atelectasis: Secondary | ICD-10-CM | POA: Diagnosis not present

## 2021-02-13 DIAGNOSIS — N400 Enlarged prostate without lower urinary tract symptoms: Secondary | ICD-10-CM | POA: Diagnosis present

## 2021-02-13 DIAGNOSIS — I214 Non-ST elevation (NSTEMI) myocardial infarction: Secondary | ICD-10-CM | POA: Diagnosis present

## 2021-02-13 DIAGNOSIS — I248 Other forms of acute ischemic heart disease: Secondary | ICD-10-CM | POA: Diagnosis not present

## 2021-02-13 DIAGNOSIS — I5032 Chronic diastolic (congestive) heart failure: Secondary | ICD-10-CM | POA: Diagnosis present

## 2021-02-13 DIAGNOSIS — I10 Essential (primary) hypertension: Secondary | ICD-10-CM

## 2021-02-13 DIAGNOSIS — D696 Thrombocytopenia, unspecified: Secondary | ICD-10-CM | POA: Diagnosis not present

## 2021-02-13 DIAGNOSIS — Z8249 Family history of ischemic heart disease and other diseases of the circulatory system: Secondary | ICD-10-CM

## 2021-02-13 DIAGNOSIS — F17200 Nicotine dependence, unspecified, uncomplicated: Secondary | ICD-10-CM | POA: Diagnosis present

## 2021-02-13 DIAGNOSIS — E876 Hypokalemia: Secondary | ICD-10-CM | POA: Diagnosis not present

## 2021-02-13 DIAGNOSIS — E119 Type 2 diabetes mellitus without complications: Secondary | ICD-10-CM | POA: Diagnosis present

## 2021-02-13 DIAGNOSIS — I4510 Unspecified right bundle-branch block: Secondary | ICD-10-CM | POA: Diagnosis present

## 2021-02-13 DIAGNOSIS — E1169 Type 2 diabetes mellitus with other specified complication: Secondary | ICD-10-CM | POA: Diagnosis present

## 2021-02-13 DIAGNOSIS — J90 Pleural effusion, not elsewhere classified: Secondary | ICD-10-CM

## 2021-02-13 DIAGNOSIS — R0600 Dyspnea, unspecified: Secondary | ICD-10-CM | POA: Diagnosis present

## 2021-02-13 DIAGNOSIS — I110 Hypertensive heart disease with heart failure: Secondary | ICD-10-CM | POA: Diagnosis present

## 2021-02-13 DIAGNOSIS — Z951 Presence of aortocoronary bypass graft: Secondary | ICD-10-CM

## 2021-02-13 HISTORY — PX: RIGHT/LEFT HEART CATH AND CORONARY ANGIOGRAPHY: CATH118266

## 2021-02-13 LAB — GLUCOSE, CAPILLARY
Glucose-Capillary: 119 mg/dL — ABNORMAL HIGH (ref 70–99)
Glucose-Capillary: 110 mg/dL — ABNORMAL HIGH (ref 70–99)
Glucose-Capillary: 86 mg/dL (ref 70–99)
Glucose-Capillary: 92 mg/dL (ref 70–99)

## 2021-02-13 LAB — POCT I-STAT 7, (LYTES, BLD GAS, ICA,H+H)
Acid-base deficit: 2 mmol/L (ref 0.0–2.0)
Bicarbonate: 25.4 mmol/L (ref 20.0–28.0)
Calcium, Ion: 1.19 mmol/L (ref 1.15–1.40)
HCT: 47 % (ref 39.0–52.0)
Hemoglobin: 16 g/dL (ref 13.0–17.0)
O2 Saturation: 96 %
Potassium: 3.5 mmol/L (ref 3.5–5.1)
Sodium: 130 mmol/L — ABNORMAL LOW (ref 135–145)
TCO2: 27 mmol/L (ref 22–32)
pCO2 arterial: 50.3 mmHg — ABNORMAL HIGH (ref 32.0–48.0)
pH, Arterial: 7.312 — ABNORMAL LOW (ref 7.350–7.450)
pO2, Arterial: 92 mmHg (ref 83.0–108.0)

## 2021-02-13 LAB — ECHOCARDIOGRAM COMPLETE
Area-P 1/2: 3.53 cm2
Height: 68 in
P 1/2 time: 508 msec
S' Lateral: 2.6 cm
Weight: 3280 oz

## 2021-02-13 LAB — POCT I-STAT EG7
Acid-Base Excess: 0 mmol/L (ref 0.0–2.0)
Acid-Base Excess: 1 mmol/L (ref 0.0–2.0)
Bicarbonate: 26.6 mmol/L (ref 20.0–28.0)
Bicarbonate: 27.7 mmol/L (ref 20.0–28.0)
Calcium, Ion: 1.18 mmol/L (ref 1.15–1.40)
Calcium, Ion: 1.24 mmol/L (ref 1.15–1.40)
HCT: 48 % (ref 39.0–52.0)
HCT: 49 % (ref 39.0–52.0)
Hemoglobin: 16.3 g/dL (ref 13.0–17.0)
Hemoglobin: 16.7 g/dL (ref 13.0–17.0)
O2 Saturation: 77 %
O2 Saturation: 79 %
Potassium: 3.5 mmol/L (ref 3.5–5.1)
Potassium: 3.6 mmol/L (ref 3.5–5.1)
Sodium: 139 mmol/L (ref 135–145)
Sodium: 139 mmol/L (ref 135–145)
TCO2: 28 mmol/L (ref 22–32)
TCO2: 29 mmol/L (ref 22–32)
pCO2, Ven: 49.7 mmHg (ref 44.0–60.0)
pCO2, Ven: 51.2 mmHg (ref 44.0–60.0)
pH, Ven: 7.337 (ref 7.250–7.430)
pH, Ven: 7.341 (ref 7.250–7.430)
pO2, Ven: 45 mmHg (ref 32.0–45.0)
pO2, Ven: 46 mmHg — ABNORMAL HIGH (ref 32.0–45.0)

## 2021-02-13 SURGERY — RIGHT/LEFT HEART CATH AND CORONARY ANGIOGRAPHY
Anesthesia: LOCAL

## 2021-02-13 MED ORDER — MIDAZOLAM HCL 2 MG/2ML IJ SOLN
INTRAMUSCULAR | Status: AC
  Filled 2021-02-13: qty 2

## 2021-02-13 MED ORDER — FENTANYL CITRATE (PF) 100 MCG/2ML IJ SOLN
INTRAMUSCULAR | Status: AC
  Filled 2021-02-13: qty 2

## 2021-02-13 MED ORDER — HEPARIN (PORCINE) IN NACL 1000-0.9 UT/500ML-% IV SOLN
INTRAVENOUS | Status: AC
  Filled 2021-02-13: qty 1000

## 2021-02-13 MED ORDER — LIDOCAINE HCL (PF) 1 % IJ SOLN
INTRAMUSCULAR | Status: DC | PRN
  Administered 2021-02-13: 5 mL via INTRADERMAL

## 2021-02-13 MED ORDER — HEPARIN SODIUM (PORCINE) 1000 UNIT/ML IJ SOLN
INTRAMUSCULAR | Status: DC | PRN
  Administered 2021-02-13: 5000 [IU] via INTRAVENOUS

## 2021-02-13 MED ORDER — SODIUM CHLORIDE 0.9% FLUSH
3.0000 mL | Freq: Two times a day (BID) | INTRAVENOUS | Status: DC
  Administered 2021-02-13: 3 mL via INTRAVENOUS

## 2021-02-13 MED ORDER — VERAPAMIL HCL 2.5 MG/ML IV SOLN
INTRAVENOUS | Status: DC | PRN
  Administered 2021-02-13: 10 mL via INTRA_ARTERIAL

## 2021-02-13 MED ORDER — PIOGLITAZONE HCL 30 MG PO TABS
30.0000 mg | ORAL_TABLET | Freq: Every day | ORAL | Status: DC
  Filled 2021-02-13 (×3): qty 1

## 2021-02-13 MED ORDER — ATORVASTATIN CALCIUM 80 MG PO TABS
80.0000 mg | ORAL_TABLET | Freq: Every day | ORAL | Status: DC
  Administered 2021-02-14 – 2021-02-15 (×2): 80 mg via ORAL
  Filled 2021-02-13 (×3): qty 1

## 2021-02-13 MED ORDER — ONDANSETRON HCL 4 MG/2ML IJ SOLN: 4.0000 mg | Freq: Four times a day (QID) | INTRAMUSCULAR | Status: DC | PRN

## 2021-02-13 MED ORDER — VERAPAMIL HCL 2.5 MG/ML IV SOLN
INTRAVENOUS | Status: AC
  Filled 2021-02-13: qty 2

## 2021-02-13 MED ORDER — ACETAMINOPHEN 325 MG PO TABS: 650.0000 mg | ORAL_TABLET | ORAL | Status: DC | PRN

## 2021-02-13 MED ORDER — SODIUM CHLORIDE 0.9 % IV SOLN: INTRAVENOUS | Status: AC

## 2021-02-13 MED ORDER — HEPARIN (PORCINE) IN NACL 1000-0.9 UT/500ML-% IV SOLN
INTRAVENOUS | Status: DC | PRN
  Administered 2021-02-13 (×2): 500 mL

## 2021-02-13 MED ORDER — SODIUM CHLORIDE 0.9 % IV SOLN: 250.0000 mL | INTRAVENOUS | Status: DC | PRN

## 2021-02-13 MED ORDER — SODIUM CHLORIDE 0.9% FLUSH: 3.0000 mL | INTRAVENOUS | Status: DC | PRN

## 2021-02-13 MED ORDER — IOHEXOL 350 MG/ML SOLN
INTRAVENOUS | Status: DC | PRN
  Administered 2021-02-13: 80 mL via INTRA_ARTERIAL

## 2021-02-13 MED ORDER — HEPARIN (PORCINE) 25000 UT/250ML-% IV SOLN
1250.0000 [IU]/h | INTRAVENOUS | Status: DC
  Administered 2021-02-13 – 2021-02-15 (×3): 1250 [IU]/h via INTRAVENOUS
  Filled 2021-02-13: qty 3250
  Filled 2021-02-13 (×2): qty 250

## 2021-02-13 MED ORDER — SODIUM CHLORIDE 0.9 % WEIGHT BASED INFUSION: 1.0000 mL/kg/h | INTRAVENOUS | Status: DC

## 2021-02-13 MED ORDER — FENTANYL CITRATE (PF) 100 MCG/2ML IJ SOLN
INTRAMUSCULAR | Status: DC | PRN
  Administered 2021-02-13: 50 ug via INTRAVENOUS

## 2021-02-13 MED ORDER — SODIUM CHLORIDE 0.9 % WEIGHT BASED INFUSION
3.0000 mL/kg/h | INTRAVENOUS | Status: DC
  Administered 2021-02-13: 3 mL/kg/h via INTRAVENOUS

## 2021-02-13 MED ORDER — LISINOPRIL 40 MG PO TABS
40.0000 mg | ORAL_TABLET | Freq: Every day | ORAL | Status: DC
  Administered 2021-02-14 – 2021-02-15 (×2): 40 mg via ORAL
  Filled 2021-02-13 (×2): qty 1

## 2021-02-13 MED ORDER — HEPARIN SODIUM (PORCINE) 1000 UNIT/ML IJ SOLN
INTRAMUSCULAR | Status: AC
  Filled 2021-02-13: qty 1

## 2021-02-13 MED ORDER — MIDAZOLAM HCL 2 MG/2ML IJ SOLN
INTRAMUSCULAR | Status: DC | PRN
  Administered 2021-02-13 (×2): 1 mg via INTRAVENOUS

## 2021-02-13 MED ORDER — OXYCODONE HCL 5 MG PO TABS: 5.0000 mg | ORAL_TABLET | ORAL | Status: DC | PRN

## 2021-02-13 MED ORDER — ASPIRIN 81 MG PO CHEW
81.0000 mg | CHEWABLE_TABLET | Freq: Every day | ORAL | Status: DC
  Administered 2021-02-14 – 2021-02-15 (×2): 81 mg via ORAL
  Filled 2021-02-13 (×3): qty 1

## 2021-02-13 MED ORDER — AMLODIPINE BESYLATE 10 MG PO TABS
10.0000 mg | ORAL_TABLET | Freq: Every day | ORAL | Status: DC
  Administered 2021-02-14 – 2021-02-15 (×2): 10 mg via ORAL
  Filled 2021-02-13 (×2): qty 1

## 2021-02-13 MED ORDER — SODIUM CHLORIDE 0.9% FLUSH: 3.0000 mL | Freq: Two times a day (BID) | INTRAVENOUS | Status: DC

## 2021-02-13 MED ORDER — HYDRALAZINE HCL 20 MG/ML IJ SOLN: 10.0000 mg | INTRAMUSCULAR | Status: AC | PRN

## 2021-02-13 MED ORDER — LIDOCAINE HCL (PF) 1 % IJ SOLN
INTRAMUSCULAR | Status: AC
  Filled 2021-02-13: qty 30

## 2021-02-13 MED ORDER — LABETALOL HCL 5 MG/ML IV SOLN: 10.0000 mg | INTRAVENOUS | Status: AC | PRN

## 2021-02-13 MED ORDER — ASPIRIN 81 MG PO CHEW: 81.0000 mg | CHEWABLE_TABLET | ORAL | Status: DC

## 2021-02-13 SURGICAL SUPPLY — 12 items
CATH 5FR JL3.5 JR4 ANG PIG MP (CATHETERS) ×1 IMPLANT
CATH BALLN WEDGE 5F 110CM (CATHETERS) ×1 IMPLANT
DEVICE RAD COMP TR BAND LRG (VASCULAR PRODUCTS) ×1 IMPLANT
GLIDESHEATH SLEND A-KIT 6F 22G (SHEATH) ×1 IMPLANT
GUIDEWIRE INQWIRE 1.5J.035X260 (WIRE) IMPLANT
INQWIRE 1.5J .035X260CM (WIRE) ×2
KIT HEART LEFT (KITS) ×2 IMPLANT
PACK CARDIAC CATHETERIZATION (CUSTOM PROCEDURE TRAY) ×2 IMPLANT
SHEATH GLIDE SLENDER 4/5FR (SHEATH) ×1 IMPLANT
SHEATH PROBE COVER 6X72 (BAG) ×1 IMPLANT
TRANSDUCER W/STOPCOCK (MISCELLANEOUS) ×2 IMPLANT
TUBING CIL FLEX 10 FLL-RA (TUBING) ×2 IMPLANT

## 2021-02-13 NOTE — Interval H&P Note (Signed)
Cath Lab Visit (complete for each Cath Lab visit)  Clinical Evaluation Leading to the Procedure:   ACS: No.  Non-ACS:    Anginal Classification: CCS [place]  Anti-ischemic medical therapy: Minimal Therapy (1 class of medications)  Non-Invasive Test Results: No non-invasive testing performed  Prior CABG: No previous CABG      History and Physical Interval Note:  02/13/2021 10:48 AM  Jesus Clayman Sr.  has presented today for surgery, with the diagnosis of Eval for PAH.  The various methods of treatment have been discussed with the patient and family. After consideration of risks, benefits and other options for treatment, the patient has consented to  Procedure(s): RIGHT/LEFT HEART CATH AND CORONARY ANGIOGRAPHY (N/A) as a surgical intervention.  The patient's history has been reviewed, patient examined, no change in status, stable for surgery.  I have reviewed the patient's chart and labs.  Questions were answered to the patient's satisfaction.     Jesus [place]

## 2021-02-13 NOTE — Progress Notes (Signed)
  Echocardiogram 2D Echocardiogram has been performed.  Jesus [place] 02/13/2021, 3:22 PM

## 2021-02-13 NOTE — Progress Notes (Signed)
TCTS consulted for CABG evaluation. °

## 2021-02-13 NOTE — Consult Note (Addendum)
[address]       [place] [address]             [place] Record [id number] Date of Birth: 07/09/1955  Referring: Dr. Daneen Schick, MD Primary Care: Ronnald Nian, DO Primary Cardiologist:None  Chief Complaint:   LE/feet edema, DOE Reason for consultation: Coronary artery disease  History of Present Illness:     This is a 65 year old male with a past medical history of hypertension, diabetes mellitus-type II, hyperlipidemia, tobacco abuse, lumbar spondylosis, and BPH who presented to cardiology's office on 02/06/2021 with complaints of DOE and lower extremity edema. According to the patient, he was in [place] for vacation in June. When he returned he got COVID 19. Of note, he has had 2 vaccines and a booster. He denied fever or cough. He just felty "awful". He was given Paxlovid. He had been instructed if he had shortness of breath to seek medical care. Because he had shortness of breath, he called EMS. He said oxygenation was fine and they asked if he wanted and EKG, which was done. Bases on the findings of the EKG the then presented to his PCP.   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 Patient denies chest pain, syncope, or palpitations. He does have some dizziness when "it is very hot outside". Patient present to [facility] today in order to undergo a cardiac catheterization. Cardiac catheterization done today shows dilated aorta (4.7 cm) and severe ostial LAD eccentric stenosis (95%), partially involving distal left main. Echo has been ordered but has not been done as of yet. Dr. Kipp Brood has been consulted for consideration of coronary artery bypass grafting surgery.  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 Until COVID, he travelled a lot within the [place] and abroad. He is LEFT hand dominant and has been cath'd via right radial artery.  Current Activity/ Functional  Status: Patient is independent with mobility/ambulation, transfers, ADL's, IADL's.   Zubrod Score: At the time of surgery this patient's most appropriate activity status/level should be described as: '[x]'$     0    Normal activity, no symptoms '[]'$     1    Restricted in physical strenuous activity but ambulatory, able to do out light work '[]'$     2    Ambulatory and capable of self care, unable to do work activities, up and about more than 50%  Of the time                            '[]'$     3    Only limited self care, in bed greater than 50% of waking hours '[]'$     4    Completely disabled, no self care, confined to bed or chair '[]'$     5    Moribund  Past Medical History:  Diagnosis Date   Cancer ([place])    Diabetes mellitus without complication ([place])   BPH Hyperlipidemia Hypertension  Past Surgical History:  Procedure Laterality Date   APPENDECTOMY     65yo   MOHS SURGERY     SCC Rt arm   TONSILLECTOMY      Social History   Tobacco Use  Smoking Status Every Day   Packs/day: 1.00   Types: Cigarettes  Smokeless Tobacco Never  He has smoke over 40 years  Social History   Substance and Sexual Activity  Alcohol Use Yes   Comment: Occas    Allergies: No Known Allergies  Current Facility-Administered Medications  Medication Dose Route Frequency Provider Last Rate Last Admin   0.9 %  sodium chloride infusion  250 mL Intravenous PRN Tobb, Kardie, DO       0.9% sodium chloride infusion  1 mL/kg/hr Intravenous Continuous Tobb, Kardie, DO 93.4 mL/hr at 02/13/21 1130 1 mL/kg/hr at 02/13/21 1130   aspirin chewable tablet 81 mg  81 mg Oral Pre-Cath Tobb, Kardie, DO       sodium chloride flush (NS) 0.9 % injection 3 mL  3 mL Intravenous Q12H Tobb, Kardie, DO       sodium chloride flush (NS) 0.9 % injection 3 mL  3 mL Intravenous PRN Tobb, Kardie, DO        Medications Prior to Admission  Medication Sig Dispense Refill Last Dose   amLODipine (NORVASC) 10 MG tablet Take 1 tablet (10 mg  total) by mouth daily. 90 tablet 3 02/13/2021 at 0730   atorvastatin (LIPITOR) 40 MG tablet Take 1 tablet (40 mg total) by mouth daily. 90 tablet 3 02/13/2021 at 0730   diclofenac (VOLTAREN) 50 MG EC tablet Take 1 tablet (50 mg total) by mouth daily after breakfast. 90 tablet 1 02/13/2021   dutasteride (AVODART) 0.5 MG capsule Take 1 capsule (0.5 mg total) by mouth daily. 90 capsule 3 02/13/2021 at 0430   lisinopril (ZESTRIL) 40 MG tablet Take 1 tablet (40 mg total) by mouth daily. 90 tablet 3 02/13/2021   pioglitazone (ACTOS) 30 MG tablet Take 1 tablet (30 mg total) by mouth daily. 90 tablet 3 02/12/2021    Family History  Problem Relation Age of Onset   Arthritis Mother    Cancer Father    Hypertension Father   He is the youngest and only living of 5. His sister died from an MI March/April 2021 and his one brother died at age 44 from a stroke last April. He lived in [place], [place] until last year. He was originally from [place] and returned to the area as he has a son and 2 grand children here.  Review of Systems:      Cardiac Review of Systems: Y or  [   N ]= no  Chest Pain [   N ]  exertional SOB  [ N ]  Orthopnea [ N ]  Pedal Edema [   Y]    Palpitations [  N] Syncope  [  N]   Presyncope [  N ]  General Review of Systems: [Y] = yes [N  ]=no Constitional: fatigue [ Y-per patient, thinks from the hot tempartures ]; nausea [ N ]; night sweats [ N ]; fever [  N]; or    chills[ N]                                                             Eye :  Amaurosis fugax[  N]; Resp: cough [ N ];  wheezing[ N ];  hemoptysis[ N ];  GI:   vomiting[ N ]; melena[ N ];  hematochezia [ N ];  Hx of  Colonoscopy[ per patient, has been over 15 years;trying to get referral from primary for one]; GU: hematuria[  N];                Skin: rash, swelling[N  ];,  peripheral edema[ Yes on admission ];   Musculosketetal: back pain[ Y ];  Heme/Lymph:  anemia[N  ];  Neuro: TIA[  N];  headaches[  ];  stroke[N  ];   seizures[N ];       Endocrine: diabetes[ Y ];              Patient has had 2 vaccines and booster against COVID.       Physical Exam: BP (!) 142/87   Pulse (!) 0   Temp 98.2 F (36.8 C)   Resp (!) 36   Ht '5\' 8"'$  (1.727 m)   Wt 93 kg   SpO2 (!) 0%   BMI 31.17 kg/m    General appearance: alert, cooperative, and no distress Head: Normocephalic, without obvious abnormality, atraumatic Neck: no carotid bruit, no JVD, and supple, symmetrical, trachea midline Resp: clear to auscultation bilaterally Cardio: RRR, no murmur or rub GI: Soft, non tender, protuberant, bowel sounds present Extremities: No LE edema. Palpable DP bilaterally Neurologic: Grossly normal  Diagnostic Studies & Laboratory data:     Recent Radiology Findings:   CARDIAC CATHETERIZATION  Result Date: 02/13/2021 Severe ostial LAD eccentric stenosis also partially involving the distal left main.  Slight reduction in LAD antegrade flow.  Lesion has the appearance of plaque rupture with dissection. Left main is large, greater than 6 mm in diameter Circumflex is widely patent Dominant and widely patent Hyperdynamic left ventricle.  LVEF greater than 55%. Dilated aorta RECOMMENDATIONS: LIMA to LAD 2D Doppler echocardiogram to assess aortic root size With decreased LAD flow, recommend keeping patient in-house, coordinate with surgeons, and manage accordingly.     I have independently reviewed the above radiologic studies and discussed with the patient   Recent Lab Findings: Lab Results  Component Value Date   WBC 9.0 02/10/2021   HGB 17.8 (H) 02/10/2021   HCT 52.5 (H) 02/10/2021   PLT 235 02/10/2021   GLUCOSE 127 (H) 02/10/2021   CHOL 172 01/08/2021   TRIG 115.0 01/08/2021   HDL 49.60 01/08/2021   LDLDIRECT 102.0 06/18/2020   LDLCALC 99 01/08/2021   ALT 20 01/08/2021   AST 18 01/08/2021   NA 136 02/10/2021   K 4.1 02/10/2021   CL 100 02/10/2021   CREATININE 0.81 02/10/2021   BUN 9 02/10/2021   CO2 22  02/10/2021   TSH 5.52 (H) 01/23/2021   HGBA1C 6.8 (H) 01/08/2021   Assessment / Plan:   Coronary artery disease-patient with severe (95%) distal LM to ostial LAD stenosis and moderately dilated ascending aorta. Revascularization is likely best option. Dr. Kipp Brood to evaluate and if surgery is best treatment option, he will determine timing. History of hypertension-on Amlodipine 10 mg daily and Lisinopril 40 mg daily History of hyperlipidemia-on Atorvastatin 80 mg at hs History of diabetes mellitus-on Actos 30 mg daily. Pre op HGA1C 6.8 History of BPH-on Dutasteride prior to admission 6. History of lumbar spondylosis,DDD-was on Voltaren daily    I  spent 20 minutes counseling the patient face to face.   Lars Pinks PA-C 02/13/2021 12:29 PM    Agree with above.  65 year old male admitted following elective left heart cath which shows severe left main disease.  His echocardiogram shows preserved biventricular function and no significant valvular disease.  He is tentatively scheduled for 02/16/2021 for two-vessel CABG.  Elston Aldape Bary Leriche

## 2021-02-13 NOTE — Plan of Care (Signed)

## 2021-02-13 NOTE — CV Procedure (Signed)
Greater than 95% ostial LAD with appearance of ruptured plaque/spontaneous dissection and slightly diminished left anterior descending flow. Dilated aorta.  Needs 2D Doppler echocardiogram to assess. Catheterization is otherwise unremarkable.  LV function normal.  LVEDP is normal.

## 2021-02-13 NOTE — Progress Notes (Signed)
ANTICOAGULATION CONSULT NOTE - Initial Consult  Pharmacy Consult for heparin  Indication: chest pain/ACS  No Known Allergies  Patient Measurements: Height: '5\' 8"'$  (172.7 cm) Weight: 93 kg (205 lb) IBW/kg (Calculated) : 68.4 HEPARIN DW (KG): 87.7   Vital Signs: Temp: 98 F (36.7 C) (08/12 1319) Temp Source: Oral (08/12 1319) BP: 136/82 (08/12 1319) Pulse Rate: 73 (08/12 1319)  Labs: Recent Labs    02/10/21 1431  HGB 17.8*  HCT 52.5*  PLT 235  CREATININE 0.81    Estimated Creatinine Clearance: 99.2 mL/min (by C-G formula based on SCr of 0.81 mg/dL).   Medical History: Past Medical History:  Diagnosis Date   Cancer ([place])    Diabetes mellitus without complication ([place])     Medications:  Medications Prior to Admission  Medication Sig Dispense Refill Last Dose   amLODipine (NORVASC) 10 MG tablet Take 1 tablet (10 mg total) by mouth daily. 90 tablet 3 02/13/2021 at 0730   atorvastatin (LIPITOR) 40 MG tablet Take 1 tablet (40 mg total) by mouth daily. 90 tablet 3 02/13/2021 at 0730   diclofenac (VOLTAREN) 50 MG EC tablet Take 1 tablet (50 mg total) by mouth daily after breakfast. 90 tablet 1 02/13/2021   dutasteride (AVODART) 0.5 MG capsule Take 1 capsule (0.5 mg total) by mouth daily. 90 capsule 3 02/13/2021 at 0430   lisinopril (ZESTRIL) 40 MG tablet Take 1 tablet (40 mg total) by mouth daily. 90 tablet 3 02/13/2021   pioglitazone (ACTOS) 30 MG tablet Take 1 tablet (30 mg total) by mouth daily. 90 tablet 3 02/12/2021   Scheduled:   amLODipine  10 mg Oral Daily   aspirin  81 mg Oral Daily   atorvastatin  80 mg Oral Daily   lisinopril  40 mg Oral Daily   pioglitazone  30 mg Oral Daily   sodium chloride flush  3 mL Intravenous Q12H    Assessment: 5 you male s/p cath with severe LAD disease (sheath removed~ 11:45am). Plans for possible CABG, heparin to start 8 hours post sheath pull. No anticoagulants noted PTA.   Goal of Therapy:  Heparin level 0.3-0.7  units/ml Monitor platelets by anticoagulation protocol: Yes   Plan:  -Start heparin at 1250 units/hr at 8pm -Heparin level and CBC in am  Hildred Laser, PharmD Clinical Pharmacist **Pharmacist phone directory can now be found on [email or web address] ([email or web address]).  Listed under [place].

## 2021-02-13 NOTE — Progress Notes (Signed)
Left [facility] educational materials and IS as pt is out of room for test. Yves Dill CES, ACSM 2:37 PM 02/13/2021

## 2021-02-13 NOTE — Progress Notes (Signed)
  Echocardiogram 2D Echocardiogram has been performed.  Johny Chess 02/13/2021, 3:22 PM

## 2021-02-13 NOTE — Progress Notes (Signed)
Pre-CABG testing has been completed. Preliminary results can be found in CV Proc through chart review.   02/13/21 2:42 PM Jesus Davenport RVT

## 2021-02-14 ENCOUNTER — Inpatient Hospital Stay (HOSPITAL_COMMUNITY): Payer: Medicare Other

## 2021-02-14 DIAGNOSIS — I200 Unstable angina: Secondary | ICD-10-CM | POA: Diagnosis not present

## 2021-02-14 LAB — URINALYSIS, ROUTINE W REFLEX MICROSCOPIC
Bilirubin Urine: NEGATIVE
Glucose, UA: NEGATIVE mg/dL
Hgb urine dipstick: NEGATIVE
Ketones, ur: 5 mg/dL — AB
Leukocytes,Ua: NEGATIVE
Nitrite: NEGATIVE
Protein, ur: NEGATIVE mg/dL
Specific Gravity, Urine: 1.032 — ABNORMAL HIGH (ref 1.005–1.030)
pH: 7 (ref 5.0–8.0)

## 2021-02-14 LAB — CBC
HCT: 47.9 % (ref 39.0–52.0)
Hemoglobin: 16.4 g/dL (ref 13.0–17.0)
MCH: 30.5 pg (ref 26.0–34.0)
MCHC: 34.2 g/dL (ref 30.0–36.0)
MCV: 89.2 fL (ref 80.0–100.0)
Platelets: 184 10*3/uL (ref 150–400)
RBC: 5.37 MIL/uL (ref 4.22–5.81)
RDW: 14 % (ref 11.5–15.5)
WBC: 9.4 10*3/uL (ref 4.0–10.5)
nRBC: 0 % (ref 0.0–0.2)

## 2021-02-14 LAB — SURGICAL PCR SCREEN
MRSA, PCR: NEGATIVE
Staphylococcus aureus: NEGATIVE

## 2021-02-14 LAB — GLUCOSE, CAPILLARY
Glucose-Capillary: 111 mg/dL — ABNORMAL HIGH (ref 70–99)
Glucose-Capillary: 104 mg/dL — ABNORMAL HIGH (ref 70–99)
Glucose-Capillary: 86 mg/dL (ref 70–99)
Glucose-Capillary: 89 mg/dL (ref 70–99)

## 2021-02-14 LAB — ABO/RH: ABO/RH(D): O POS

## 2021-02-14 LAB — HEPARIN LEVEL (UNFRACTIONATED)
Heparin Unfractionated: 0.32 IU/mL (ref 0.30–0.70)
Heparin Unfractionated: 0.42 IU/mL (ref 0.30–0.70)

## 2021-02-14 LAB — PREPARE RBC (CROSSMATCH)

## 2021-02-14 MED ORDER — EPINEPHRINE HCL 5 MG/250ML IV SOLN IN NS
0.0000 ug/min | INTRAVENOUS | Status: DC
  Filled 2021-02-14: qty 250

## 2021-02-14 MED ORDER — VANCOMYCIN HCL 1500 MG/300ML IV SOLN
1500.0000 mg | INTRAVENOUS | Status: AC
  Administered 2021-02-16: 1500 mg via INTRAVENOUS
  Filled 2021-02-14: qty 300

## 2021-02-14 MED ORDER — SODIUM CHLORIDE 0.9 % IV SOLN
INTRAVENOUS | Status: DC
  Filled 2021-02-14: qty 30

## 2021-02-14 MED ORDER — METOPROLOL TARTRATE 25 MG PO TABS
25.0000 mg | ORAL_TABLET | Freq: Two times a day (BID) | ORAL | Status: DC
  Administered 2021-02-14 – 2021-02-15 (×4): 25 mg via ORAL
  Filled 2021-02-14 (×4): qty 1

## 2021-02-14 MED ORDER — IOHEXOL 350 MG/ML SOLN
75.0000 mL | Freq: Once | INTRAVENOUS | Status: AC | PRN
  Administered 2021-02-14: 75 mL via INTRAVENOUS

## 2021-02-14 MED ORDER — NITROGLYCERIN IN D5W 200-5 MCG/ML-% IV SOLN
2.0000 ug/min | INTRAVENOUS | Status: DC
Start: 2021-02-16 — End: 2021-02-16
  Filled 2021-02-14: qty 250

## 2021-02-14 MED ORDER — MANNITOL 20 % IV SOLN
Freq: Once | INTRAVENOUS | Status: DC
  Filled 2021-02-14: qty 13

## 2021-02-14 MED ORDER — CEFAZOLIN SODIUM-DEXTROSE 2-4 GM/100ML-% IV SOLN
2.0000 g | INTRAVENOUS | Status: AC
  Administered 2021-02-16: 2 g via INTRAVENOUS
  Filled 2021-02-14: qty 100

## 2021-02-14 MED ORDER — INSULIN REGULAR(HUMAN) IN NACL 100-0.9 UT/100ML-% IV SOLN
INTRAVENOUS | Status: AC
  Administered 2021-02-16: 2.6 [IU]/h via INTRAVENOUS
  Filled 2021-02-14: qty 100

## 2021-02-14 MED ORDER — INSULIN ASPART 100 UNIT/ML IJ SOLN
0.0000 [IU] | Freq: Three times a day (TID) | INTRAMUSCULAR | Status: DC
  Administered 2021-02-15 (×2): 2 [IU] via SUBCUTANEOUS

## 2021-02-14 MED ORDER — TRANEXAMIC ACID (OHS) PUMP PRIME SOLUTION
2.0000 mg/kg | INTRAVENOUS | Status: DC
  Filled 2021-02-14: qty 1.86

## 2021-02-14 MED ORDER — MILRINONE LACTATE IN DEXTROSE 20-5 MG/100ML-% IV SOLN
0.3000 ug/kg/min | INTRAVENOUS | Status: DC
  Filled 2021-02-14: qty 100

## 2021-02-14 MED ORDER — NOREPINEPHRINE 4 MG/250ML-% IV SOLN
0.0000 ug/min | INTRAVENOUS | Status: DC
  Filled 2021-02-14: qty 250

## 2021-02-14 MED ORDER — TRANEXAMIC ACID 1000 MG/10ML IV SOLN
1.5000 mg/kg/h | INTRAVENOUS | Status: AC
  Administered 2021-02-16: 1.5 mg/kg/h via INTRAVENOUS
  Filled 2021-02-14: qty 25

## 2021-02-14 MED ORDER — PLASMA-LYTE A IV SOLN
INTRAVENOUS | Status: DC
  Filled 2021-02-14 (×2): qty 5

## 2021-02-14 MED ORDER — PHENYLEPHRINE HCL-NACL 20-0.9 MG/250ML-% IV SOLN
30.0000 ug/min | INTRAVENOUS | Status: AC
  Administered 2021-02-16: 20 ug/min via INTRAVENOUS
  Filled 2021-02-14: qty 250

## 2021-02-14 MED ORDER — INSULIN ASPART 100 UNIT/ML IJ SOLN: 0.0000 [IU] | Freq: Every day | INTRAMUSCULAR | Status: DC

## 2021-02-14 MED ORDER — POTASSIUM CHLORIDE 2 MEQ/ML IV SOLN
80.0000 meq | INTRAVENOUS | Status: DC
  Filled 2021-02-14: qty 40

## 2021-02-14 MED ORDER — TRANEXAMIC ACID (OHS) BOLUS VIA INFUSION
15.0000 mg/kg | INTRAVENOUS | Status: AC
  Administered 2021-02-16: 1395 mg via INTRAVENOUS
  Filled 2021-02-14: qty 1395

## 2021-02-14 MED ORDER — DEXMEDETOMIDINE HCL IN NACL 400 MCG/100ML IV SOLN
0.1000 ug/kg/h | INTRAVENOUS | Status: AC
Start: 2021-02-16 — End: 2021-02-16
  Administered 2021-02-16: .4 ug/kg/h via INTRAVENOUS
  Filled 2021-02-14: qty 100

## 2021-02-14 NOTE — Progress Notes (Signed)
Cardiology Progress Note  Patient ID: Jesus GALIN Sr. MRN: [id number] DOB: [birth year] Date of Encounter: 02/14/2021  Primary Cardiologist: None  Subjective   Chief Complaint: No chest pain.  HPI: Severe ostial LAD stenosis.  On heparin.  No chest pain.  Awaiting CABG on Monday.  ROS:  All other ROS reviewed and negative. Pertinent positives noted in the HPI.     Inpatient Medications  Scheduled Meds:  amLODipine  10 mg Oral Daily   aspirin  81 mg Oral Daily   atorvastatin  80 mg Oral Daily   insulin aspart  0-15 Units Subcutaneous TID WC   insulin aspart  0-5 Units Subcutaneous QHS   lisinopril  40 mg Oral Daily   metoprolol tartrate  25 mg Oral BID   sodium chloride flush  3 mL Intravenous Q12H   Continuous Infusions:  sodium chloride     heparin 1,250 Units/hr (02/13/21 2015)   PRN Meds: sodium chloride, acetaminophen, ondansetron (ZOFRAN) IV, oxyCODONE, sodium chloride flush   Vital Signs   Vitals:   02/13/21 1319 02/13/21 2016 02/14/21 0012 02/14/21 0448  BP: 136/82 130/80 130/86 132/81  Pulse: 73 71 74 75  Resp: '14 18 18 18  '$ Temp: 98 F (36.7 C) 98 F (36.7 C) 98.4 F (36.9 C) (!) 97 F (36.1 C)  TempSrc: Oral Oral Oral Oral  SpO2: 95% 93% 90% 92%  Weight:      Height:        Intake/Output Summary (Last 24 hours) at 02/14/2021 0733 Last data filed at 02/14/2021 0400 Gross per 24 hour  Intake 1358.1 ml  Output 500 ml  Net 858.1 ml   Last 3 Weights 02/13/2021 02/06/2021 01/23/2021  Weight (lbs) 205 lb 206 lb 206 lb 9.6 oz  Weight (kg) 92.987 kg 93.441 kg 93.713 kg      Telemetry  Overnight telemetry shows sinus rhythm in the 70s, which I personally reviewed.   ECG  The most recent ECG shows sinus rhythm heart rate 78, right bundle branch block with left anterior fascicular block, which I personally reviewed.   Physical Exam   Vitals:   02/13/21 1319 02/13/21 2016 02/14/21 0012 02/14/21 0448  BP: 136/82 130/80 130/86 132/81  Pulse: 73 71 74  75  Resp: '14 18 18 18  '$ Temp: 98 F (36.7 C) 98 F (36.7 C) 98.4 F (36.9 C) (!) 97 F (36.1 C)  TempSrc: Oral Oral Oral Oral  SpO2: 95% 93% 90% 92%  Weight:      Height:        Intake/Output Summary (Last 24 hours) at 02/14/2021 0733 Last data filed at 02/14/2021 0400 Gross per 24 hour  Intake 1358.1 ml  Output 500 ml  Net 858.1 ml    Last 3 Weights 02/13/2021 02/06/2021 01/23/2021  Weight (lbs) 205 lb 206 lb 206 lb 9.6 oz  Weight (kg) 92.987 kg 93.441 kg 93.713 kg    Body mass index is 31.17 kg/m.  General: Well nourished, well developed, in no acute distress Head: Atraumatic, normal size  Eyes: PEERLA, EOMI  Neck: Supple, no JVD Endocrine: No thryomegaly Cardiac: Normal S1, S2; RRR; no murmurs, rubs, or gallops Lungs: Clear to auscultation bilaterally, no wheezing, rhonchi or rales  Abd: Soft, nontender, no hepatomegaly  Ext: No edema, pulses 2+, right radial cath site clean and dry Musculoskeletal: No deformities, BUE and BLE strength normal and equal Skin: Warm and dry, no rashes   Neuro: Alert and oriented to person, place, time, and situation,  CNII-XII grossly intact, no focal deficits  Psych: Normal mood and affect   Labs  High Sensitivity Troponin:  No results for input(s): TROPONINIHS in the last 720 hours.   Cardiac EnzymesNo results for input(s): TROPONINI in the last 168 hours. No results for input(s): TROPIPOC in the last 168 hours.  Chemistry Recent Labs  Lab 02/10/21 1431 02/13/21 1120 02/13/21 1125  NA 136 139  139 130*  K 4.1 3.6  3.5 3.5  CL 100  --   --   CO2 22  --   --   GLUCOSE 127*  --   --   BUN 9  --   --   CREATININE 0.81  --   --   CALCIUM 9.4  --   --     Hematology Recent Labs  Lab 02/10/21 1431 02/13/21 1120 02/13/21 1125 02/14/21 0344  WBC 9.0  --   --  9.4  RBC 5.90*  --   --  5.37  HGB 17.8* 16.7  16.3 16.0 16.4  HCT 52.5* 49.0  48.0 47.0 47.9  MCV 89  --   --  89.2  MCH 30.2  --   --  30.5  MCHC 33.9  --   --  34.2   RDW 13.7  --   --  14.0  PLT 235  --   --  184   BNPNo results for input(s): BNP, PROBNP in the last 168 hours.  DDimer No results for input(s): DDIMER in the last 168 hours.   Radiology  CARDIAC CATHETERIZATION  Result Date: 02/13/2021 Severe ostial LAD eccentric stenosis also partially involving the distal left main.  Slight reduction in LAD antegrade flow.  Lesion has the appearance of plaque rupture with dissection. Left main is large, greater than 6 mm in diameter Circumflex is widely patent Dominant and widely patent Hyperdynamic left ventricle.  LVEF greater than 55%. Dilated aorta RECOMMENDATIONS: LIMA to LAD 2D Doppler echocardiogram to assess aortic root size With decreased LAD flow, recommend keeping patient in-house, coordinate with surgeons, and manage accordingly.   ECHOCARDIOGRAM COMPLETE  Result Date: 02/13/2021    ECHOCARDIOGRAM REPORT   Patient Name:   Jesus MOYNAHAN Sr. Date of Exam: 02/13/2021 Medical Rec #:  [id number]           Height:       68.0 in Accession #:    [id number]          Weight:       205.0 lb Date of Birth:  [birth year]           BSA:          2.065 m Patient Age:    65 years            BP:           136/82 mmHg Patient Gender: M                   HR:           75 bpm. Exam Location:  Inpatient Procedure: 2D Echo Indications:    acute ischemic heart disease  History:        Patient has no prior history of Echocardiogram examinations.                 CAD; Risk Factors:Diabetes and Hypertension.  Sonographer:    Johny Chess RDCS Referring Phys: [place]  1. Left ventricular ejection fraction, by estimation, is 60 to  65%. The left ventricle has normal function. The left ventricle has no regional wall motion abnormalities. Left ventricular diastolic parameters are consistent with Grade I diastolic dysfunction (impaired relaxation).  2. Right ventricular systolic function is normal. The right ventricular size is normal. There is normal  pulmonary artery systolic pressure.  3. The mitral valve is normal in structure. No evidence of mitral valve regurgitation. No evidence of mitral stenosis.  4. The aortic valve is tricuspid. Aortic valve regurgitation is mild. No aortic stenosis is present.  5. Aortic dilatation noted. There is moderate dilatation of the ascending aorta, measuring 45 mm.  6. The inferior vena cava is normal in size with greater than 50% respiratory variability, suggesting right atrial pressure of 3 mmHg. Conclusion(s)/Recommendation(s): Recommend gated CTA of aorta to verify 45 mm ascending aorta. FINDINGS  Left Ventricle: Left ventricular ejection fraction, by estimation, is 60 to 65%. The left ventricle has normal function. The left ventricle has no regional wall motion abnormalities. The left ventricular internal cavity size was normal in size. There is  no left ventricular hypertrophy. Left ventricular diastolic parameters are consistent with Grade I diastolic dysfunction (impaired relaxation). Right Ventricle: The right ventricular size is normal. No increase in right ventricular wall thickness. Right ventricular systolic function is normal. There is normal pulmonary artery systolic pressure. The tricuspid regurgitant velocity is 1.95 m/s, and  with an assumed right atrial pressure of 3 mmHg, the estimated right ventricular systolic pressure is 123XX123 mmHg. Left Atrium: Left atrial size was normal in size. Right Atrium: Right atrial size was normal in size. Pericardium: There is no evidence of pericardial effusion. Mitral Valve: The mitral valve is normal in structure. No evidence of mitral valve regurgitation. No evidence of mitral valve stenosis. Tricuspid Valve: The tricuspid valve is normal in structure. Tricuspid valve regurgitation is not demonstrated. No evidence of tricuspid stenosis. Aortic Valve: The aortic valve is tricuspid. Aortic valve regurgitation is mild. Aortic regurgitation PHT measures 508 msec. No aortic  stenosis is present. Pulmonic Valve: The pulmonic valve was normal in structure. Pulmonic valve regurgitation is not visualized. No evidence of pulmonic stenosis. Aorta: Aortic dilatation noted. There is moderate dilatation of the ascending aorta, measuring 45 mm. Venous: The inferior vena cava is normal in size with greater than 50% respiratory variability, suggesting right atrial pressure of 3 mmHg. IAS/Shunts: No atrial level shunt detected by color flow Doppler.  LEFT VENTRICLE PLAX 2D LVIDd:         4.50 cm  Diastology LVIDs:         2.60 cm  LV e' medial:    7.72 cm/s LV PW:         1.10 cm  LV E/e' medial:  11.0 LV IVS:        1.00 cm  LV e' lateral:   7.29 cm/s LVOT diam:     2.30 cm  LV E/e' lateral: 11.6 LV SV:         86 LV SV Index:   42 LVOT Area:     4.15 cm  RIGHT VENTRICLE             IVC RV S prime:     11.60 cm/s  IVC diam: 1.50 cm TAPSE (M-mode): 2.4 cm LEFT ATRIUM             Index       RIGHT ATRIUM           Index LA diam:  3.30 cm 1.60 cm/m  RA Area:     17.10 cm LA Vol (A2C):   34.5 ml 16.70 ml/m RA Volume:   44.00 ml  21.30 ml/m LA Vol (A4C):   40.8 ml 19.76 ml/m LA Biplane Vol: 37.4 ml 18.11 ml/m  AORTIC VALVE LVOT Vmax:   89.70 cm/s LVOT Vmean:  60.300 cm/s LVOT VTI:    0.208 m AI PHT:      508 msec  AORTA Ao Root diam: 3.50 cm Ao Asc diam:  4.25 cm MITRAL VALVE                TRICUSPID VALVE MV Area (PHT): 3.53 cm     TR Peak grad:   15.2 mmHg MV Decel Time: 215 msec     TR Vmax:        195.00 cm/s MV E velocity: 84.60 cm/s MV A velocity: 101.00 cm/s  SHUNTS MV E/A ratio:  0.84         Systemic VTI:  0.21 m                             Systemic Diam: 2.30 cm Candee Furbish MD Electronically signed by Candee Furbish MD Signature Date/Time: 02/13/2021/4:44:27 PM    Final    VAS US DOPPLER PRE CABG  Result Date: 02/13/2021 PREOPERATIVE VASCULAR EVALUATION Patient Name:  Jesus CORLESS Sr.  Date of Exam:   02/13/2021 Medical Rec #: [id number]            Accession #:    [id number]  Date of Birth: 1955-09-09            Patient Gender: M Patient Age:   31 years Exam Location:  [facility] Procedure:      VAS US DOPPLER PRE CABG Referring Phys: HARRELL LIGHTFOOT --------------------------------------------------------------------------------  Indications:      Pre-CABG. Risk Factors:     Hypertension, Diabetes. Comparison Study: No prior studies. Performing Technologist: Carlos Levering RVT  Examination Guidelines: A complete evaluation includes B-mode imaging, spectral Doppler, color Doppler, and power Doppler as needed of all accessible portions of each vessel. Bilateral testing is considered an integral part of a complete examination. Limited examinations for reoccurring indications may be performed as noted.  Right Carotid Findings: +----------+--------+--------+--------+-----------------------+--------+           PSV cm/sEDV cm/sStenosisDescribe               Comments +----------+--------+--------+--------+-----------------------+--------+ CCA Prox  73      13              smooth and heterogenous         +----------+--------+--------+--------+-----------------------+--------+ CCA Distal40      7               smooth and heterogenous         +----------+--------+--------+--------+-----------------------+--------+ ICA Prox  29      8               smooth and heterogenous         +----------+--------+--------+--------+-----------------------+--------+ ICA Distal55      19                                     tortuous +----------+--------+--------+--------+-----------------------+--------+ ECA       72      12                                              +----------+--------+--------+--------+-----------------------+--------+ +----------+--------+-------+--------+------------+  PSV cm/sEDV cmsDescribeArm Pressure +----------+--------+-------+--------+------------+ Subclavian86                                   +----------+--------+-------+--------+------------+ +---------+--------+--+--------+--+---------+ VertebralPSV cm/s32EDV cm/s12Antegrade +---------+--------+--+--------+--+---------+ Left Carotid Findings: +----------+--------+--------+--------+-----------------------+--------+           PSV cm/sEDV cm/sStenosisDescribe               Comments +----------+--------+--------+--------+-----------------------+--------+ CCA Prox  80      14              smooth and heterogenous         +----------+--------+--------+--------+-----------------------+--------+ CCA Distal49      10              smooth and heterogenous         +----------+--------+--------+--------+-----------------------+--------+ ICA Prox  41      14              smooth and heterogenous         +----------+--------+--------+--------+-----------------------+--------+ ICA Distal47      15                                     tortuous +----------+--------+--------+--------+-----------------------+--------+ ECA       102     14                                              +----------+--------+--------+--------+-----------------------+--------+  +----------+--------+--------+--------+------------+ SubclavianPSV cm/sEDV cm/sDescribeArm Pressure +----------+--------+--------+--------+------------+           75                                   +----------+--------+--------+--------+------------+ +---------+--------+--+--------+-+---------+ VertebralPSV cm/s25EDV cm/s7Antegrade +---------+--------+--+--------+-+---------+  ABI Findings: +--------+------------------+-----+---------+--------+ Right   Rt Pressure (mmHg)IndexWaveform Comment  +--------+------------------+-----+---------+--------+ Brachial                                TR band  +--------+------------------+-----+---------+--------+ PTA     168               1.15 triphasic          +--------+------------------+-----+---------+--------+ DP      167               1.14 triphasic         +--------+------------------+-----+---------+--------+ +--------+------------------+-----+---------+-------+ Left    Lt Pressure (mmHg)IndexWaveform Comment +--------+------------------+-----+---------+-------+ [id number]                    triphasic        +--------+------------------+-----+---------+-------+ PTA     158               1.08 triphasic        +--------+------------------+-----+---------+-------+ DP      165               1.13 triphasic        +--------+------------------+-----+---------+-------+ +-------+---------------+----------------+ ABI/TBIToday's ABI/TBIPrevious ABI/TBI +-------+---------------+----------------+ Right  1.15                            +-------+---------------+----------------+ Left   1.13                            +-------+---------------+----------------+  Right Doppler Findings: +--------+--------+-----+-------+--------+ Site    PressureIndexDopplerComments +--------+--------+-----+-------+--------+ Brachial                    TR band  +--------+--------+-----+-------+--------+  Left Doppler Findings: +--------+--------+-----+---------+--------+ Site    PressureIndexDoppler  Comments +--------+--------+-----+---------+--------+ [id number]          triphasic         +--------+--------+-----+---------+--------+ Radial               triphasic         +--------+--------+-----+---------+--------+ Ulnar                triphasic         +--------+--------+-----+---------+--------+  Summary: Right Carotid: Velocities in the right ICA are consistent with a 1-39% stenosis. Left Carotid: Velocities in the left ICA are consistent with a 1-39% stenosis. Vertebrals: Bilateral vertebral arteries demonstrate antegrade flow. Right ABI: Resting right ankle-brachial index is within normal range. No evidence of  significant right lower extremity arterial disease. Left ABI: Resting left ankle-brachial index is within normal range. No evidence of significant left lower extremity arterial disease. Left Upper Extremity: Doppler waveform obliterate with left radial compression. Doppler waveforms remain within normal limits with left ulnar compression.    Preliminary     Cardiac Studies   Left and right heart catheterization 02/13/2021 Severe ostial LAD stenosis Right atrial pressure 4 mmHg RV 29/3/5 PA 28/10/20 Pulmonary capillary wedge pressure 6 LVEDP 12  TTE 02/13/2021  1. Left ventricular ejection fraction, by estimation, is 60 to 65%. The  left ventricle has normal function. The left ventricle has no regional  wall motion abnormalities. Left ventricular diastolic parameters are  consistent with Grade I diastolic  dysfunction (impaired relaxation).   2. Right ventricular systolic function is normal. The right ventricular  size is normal. There is normal pulmonary artery systolic pressure.   3. The mitral valve is normal in structure. No evidence of mitral valve  regurgitation. No evidence of mitral stenosis.   4. The aortic valve is tricuspid. Aortic valve regurgitation is mild. No  aortic stenosis is present.   5. Aortic dilatation noted. There is moderate dilatation of the ascending  aorta, measuring 45 mm.   6. The inferior vena cava is normal in size with greater than 50%  respiratory variability, suggesting right atrial pressure of 3 mmHg.   Patient Profile  Jesus BEMISS Sr. is a 65 y.o. male with diabetes, hypertension, obesity, tobacco abuse was admitted on 02/13/2021 with unstable angina and severe ostial LAD stenosis.  Currently awaiting bypass.  Assessment & Plan   Unstable angina -Left heart cath yesterday with severe ostial LAD stenosis.  Not amendable to PCI. -On aspirin.  On heparin drip. -Awaiting CABG on Monday 02/16/2021 -We will add metoprolol to tartrate 25 twice  daily. -He is on amlodipine 10 mg daily. -He is on lisinopril 40 mg daily. -Normal LV function.  No evidence of heart failure. -Minimal plaque in the carotids. -We will repeat lipid profile, A1c, TSH.  2.  Diastolic heart failure -Normal wedge.  Normal pulmonary pressures.  Compensated well.  We will check a BNP.  No evidence of volume overload.  3.  Thoracic aortic aneurysm -Echo shows ascending aorta up to 45 mm.  We will check a gated aorta to get a better idea of what this is.  This will be a CTA scan which is gated.  I do not think he merits replacement at this time but we will make  sure with a gated CTA of the aorta. -Tricuspid aortic valve.  No history of aortic pathology in the family.  4.  Hyperlipidemia -On high intensity statin.  Repeat lipids tomorrow. -LDL was not at goal in July.  May need Zetia.  5.  Diabetes -Repeat A1c.  Sliding scale insulin while in-house.  Hold pioglitazone.  History of diastolic heart failure.  Likely not a good medication moving forward.  FEN -no IVF -dvt ppx: heparin drip -code: full -dispo: CABG 02/16/2021  For questions or updates, please contact [place] Please consult [email or web address] for contact info under   Time Spent with Patient: I have spent a total of 35 minutes with patient reviewing hospital notes, telemetry, EKGs, labs and examining the patient as well as establishing an assessment and plan that was discussed with the patient.  > 50% of time was spent in direct patient care.    Signed, Addison Naegeli. Audie Box, MD, [place]  02/14/2021 7:33 AM

## 2021-02-14 NOTE — Progress Notes (Signed)
Pre-operative cardiac surgery education completed with patient.  Able to demonstrate staying in the tube, IS usage, and what to expect after surgery and importance of walking and IS usage. [id number]

## 2021-02-14 NOTE — Progress Notes (Signed)
Patient taken for CTA at 1050hrs.

## 2021-02-14 NOTE — Progress Notes (Signed)
[place] for heparin  Indication: chest pain/ACS  No Known Allergies  Patient Measurements: Height: '5\' 8"'$  (172.7 cm) Weight: 93 kg (205 lb) IBW/kg (Calculated) : 68.4 HEPARIN DW (KG): 87.7   Vital Signs: Temp: 97.8 F (36.6 C) (08/13 0818) Temp Source: Oral (08/13 0818) BP: 132/85 (08/13 0818) Pulse Rate: 75 (08/13 0818)  Labs: Recent Labs    02/13/21 1120 02/13/21 1125 02/14/21 0344 02/14/21 0406 02/14/21 0953  HGB 16.7  16.3 16.0 16.4  --   --   HCT 49.0  48.0 47.0 47.9  --   --   PLT  --   --  184  --   --   HEPARINUNFRC  --   --   --  0.32 0.42    Estimated Creatinine Clearance: 99.2 mL/min (by C-G formula based on SCr of 0.81 mg/dL).  Assessment: 65 y.o. male with CAD s/p cath, awaiting CABG, for heparin.   Heparin level therapeutic x2 on gtt at 1250 units/h. CBC wnl. No bleeding or issues with infusion per RN. Plans for CABG on Monday.   Goal of Therapy:  Heparin level 0.3-0.7 units/ml Monitor platelets by anticoagulation protocol: Yes   Plan:  Continue IV heparin 1250 units/h Daily HL and CBC while on heparin Follow up [facility] plan s/p CABG Monday   Rebbeca Paul, PharmD PGY2 Ambulatory Care Pharmacy Resident 02/14/2021 11:00 AM

## 2021-02-14 NOTE — Progress Notes (Addendum)
Patient awake, alert this am. He denies chest pain or increased shortness of breath. CTA of chest ordered (has dilated aorta). Scheduled for CABG on Monday with Dr. Kipp Brood.  Agree with above.  OR on Monday.  CT reviewed.  On my read the ascending aorta does not meet criteria for replacement.  We will plan for CABG x2.  Laurie Penado Bary Leriche

## 2021-02-14 NOTE — Progress Notes (Signed)
[place] for heparin  Indication: chest pain/ACS  No Known Allergies  Patient Measurements: Height: '5\' 8"'$  (172.7 cm) Weight: 93 kg (205 lb) IBW/kg (Calculated) : 68.4 HEPARIN DW (KG): 87.7   Vital Signs: Temp: 97 F (36.1 C) (08/13 0448) Temp Source: Oral (08/13 0448) BP: 132/81 (08/13 0448) Pulse Rate: 75 (08/13 0448)  Labs: Recent Labs    02/13/21 1120 02/13/21 1125 02/14/21 0344 02/14/21 0406  HGB 16.7  16.3 16.0 16.4  --   HCT 49.0  48.0 47.0 47.9  --   PLT  --   --  184  --   HEPARINUNFRC  --   --   --  0.32     Estimated Creatinine Clearance: 99.2 mL/min (by C-G formula based on SCr of 0.81 mg/dL).  Assessment: 65 y.o. male with CAD s/p cath, awaiting poss CABG, for heparin  Goal of Therapy:  Heparin level 0.3-0.7 units/ml Monitor platelets by anticoagulation protocol: Yes   Plan:  Continue Heparin at current rate   Phillis Knack, PharmD, BCPS

## 2021-02-15 DIAGNOSIS — I200 Unstable angina: Secondary | ICD-10-CM | POA: Diagnosis not present

## 2021-02-15 LAB — LIPID PANEL
Cholesterol: 128 mg/dL (ref 0–200)
HDL: 48 mg/dL (ref >40)
LDL Cholesterol: 66 mg/dL (ref 0–99)
Total CHOL/HDL Ratio: 2.7 RATIO
Triglycerides: 70 mg/dL (ref <150)
VLDL: 14 mg/dL (ref 0–40)

## 2021-02-15 LAB — CBC
HCT: 47.5 % (ref 39.0–52.0)
Hemoglobin: 16.7 g/dL (ref 13.0–17.0)
MCH: 30.6 pg (ref 26.0–34.0)
MCHC: 35.2 g/dL (ref 30.0–36.0)
MCV: 87 fL (ref 80.0–100.0)
Platelets: 194 10*3/uL (ref 150–400)
RBC: 5.46 MIL/uL (ref 4.22–5.81)
RDW: 13.9 % (ref 11.5–15.5)
WBC: 8.6 10*3/uL (ref 4.0–10.5)
nRBC: 0 % (ref 0.0–0.2)

## 2021-02-15 LAB — COMPREHENSIVE METABOLIC PANEL
ALT: 22 U/L (ref 0–44)
AST: 23 U/L (ref 15–41)
Albumin: 3.6 g/dL (ref 3.5–5.0)
Alkaline Phosphatase: 56 U/L (ref 38–126)
Anion gap: 9 (ref 5–15)
BUN: 9 mg/dL (ref 8–23)
CO2: 24 mmol/L (ref 22–32)
Calcium: 8.9 mg/dL (ref 8.9–10.3)
Chloride: 101 mmol/L (ref 98–111)
Creatinine, Ser: 0.78 mg/dL (ref 0.61–1.24)
GFR, Estimated: >60 mL/min (ref >60)
Glucose, Bld: 99 mg/dL (ref 70–99)
Potassium: 3.7 mmol/L (ref 3.5–5.1)
Sodium: 134 mmol/L — ABNORMAL LOW (ref 135–145)
Total Bilirubin: 1.1 mg/dL (ref 0.3–1.2)
Total Protein: 6.9 g/dL (ref 6.5–8.1)

## 2021-02-15 LAB — GLUCOSE, CAPILLARY
Glucose-Capillary: 136 mg/dL — ABNORMAL HIGH (ref 70–99)
Glucose-Capillary: 137 mg/dL — ABNORMAL HIGH (ref 70–99)
Glucose-Capillary: 98 mg/dL (ref 70–99)
Glucose-Capillary: 115 mg/dL — ABNORMAL HIGH (ref 70–99)

## 2021-02-15 LAB — HEMOGLOBIN A1C
Hgb A1c MFr Bld: 6.3 % — ABNORMAL HIGH (ref 4.8–5.6)
Mean Plasma Glucose: 134.11 mg/dL

## 2021-02-15 LAB — TSH: TSH: 9.138 u[IU]/mL — ABNORMAL HIGH (ref 0.350–4.500)

## 2021-02-15 LAB — APTT: aPTT: 128 seconds — ABNORMAL HIGH (ref 24–36)

## 2021-02-15 LAB — HEPARIN LEVEL (UNFRACTIONATED): Heparin Unfractionated: 0.48 IU/mL (ref 0.30–0.70)

## 2021-02-15 LAB — SARS CORONAVIRUS 2 (TAT 6-24 HRS): SARS Coronavirus 2: NEGATIVE

## 2021-02-15 LAB — BRAIN NATRIURETIC PEPTIDE: B Natriuretic Peptide: 34.1 pg/mL (ref 0.0–100.0)

## 2021-02-15 LAB — PROTIME-INR
INR: 1.1 (ref 0.8–1.2)
Prothrombin Time: 14 seconds (ref 11.4–15.2)

## 2021-02-15 MED ORDER — CHLORHEXIDINE GLUCONATE CLOTH 2 % EX PADS
6.0000 | MEDICATED_PAD | Freq: Once | CUTANEOUS | Status: AC
  Administered 2021-02-16: 6 via TOPICAL

## 2021-02-15 MED ORDER — CHLORHEXIDINE GLUCONATE 0.12 % MT SOLN
15.0000 mL | Freq: Once | OROMUCOSAL | Status: AC
  Administered 2021-02-16: 15 mL via OROMUCOSAL
  Filled 2021-02-15: qty 15

## 2021-02-15 MED ORDER — CHLORHEXIDINE GLUCONATE CLOTH 2 % EX PADS
6.0000 | MEDICATED_PAD | Freq: Once | CUTANEOUS | Status: AC
  Administered 2021-02-15: 6 via TOPICAL

## 2021-02-15 MED ORDER — TEMAZEPAM 15 MG PO CAPS
15.0000 mg | ORAL_CAPSULE | Freq: Once | ORAL | Status: AC | PRN
  Administered 2021-02-15: 15 mg via ORAL
  Filled 2021-02-15: qty 1

## 2021-02-15 MED ORDER — BISACODYL 5 MG PO TBEC
5.0000 mg | DELAYED_RELEASE_TABLET | Freq: Once | ORAL | Status: AC
  Administered 2021-02-15: 5 mg via ORAL
  Filled 2021-02-15: qty 1

## 2021-02-15 MED ORDER — ACETAMINOPHEN 500 MG PO TABS
1000.0000 mg | ORAL_TABLET | Freq: Once | ORAL | Status: AC
  Administered 2021-02-16: 1000 mg via ORAL
  Filled 2021-02-15: qty 2

## 2021-02-15 MED ORDER — METOPROLOL TARTRATE 12.5 MG HALF TABLET
12.5000 mg | ORAL_TABLET | Freq: Once | ORAL | Status: AC
  Administered 2021-02-16: 12.5 mg via ORAL
  Filled 2021-02-15: qty 1

## 2021-02-15 NOTE — Anesthesia Preprocedure Evaluation (Addendum)
Anesthesia Evaluation  Patient identified by MRN, date of birth, ID band Patient awake    Reviewed: Allergy & Precautions, H&P , NPO status , Patient's Chart, lab work & pertinent test results  Airway Mallampati: II  TM Distance: >3 FB Neck ROM: Full    Dental no notable dental hx. (+) Teeth Intact, Dental Advisory Given   Pulmonary Current Smoker and Patient abstained from smoking.,    Pulmonary exam normal breath sounds clear to auscultation       Cardiovascular Exercise Tolerance: Good hypertension, Pt. on medications + angina + CAD   Rhythm:Regular Rate:Normal     Neuro/Psych negative neurological ROS  negative psych ROS   GI/Hepatic negative GI ROS, Neg liver ROS,   Endo/Other  diabetes, Type 2, Oral Hypoglycemic AgentsHypothyroidism   Renal/GU negative Renal ROS  negative genitourinary   Musculoskeletal  (+) Arthritis , Osteoarthritis,    Abdominal   Peds  Hematology negative hematology ROS (+)   Anesthesia Other Findings   Reproductive/Obstetrics negative OB ROS                            Anesthesia Physical Anesthesia Plan  ASA: 4  Anesthesia Plan: General   Post-op Pain Management:    Induction: Intravenous  PONV Risk Score and Plan: 1 and Midazolam  Airway Management Planned: Oral ETT  Additional Equipment: Arterial line, CVP, TEE and Ultrasound Guidance Line Placement  Intra-op Plan:   Post-operative Plan: Post-operative intubation/ventilation  Informed Consent: I have reviewed the patients History and Physical, chart, labs and discussed the procedure including the risks, benefits and alternatives for the proposed anesthesia with the patient or authorized representative who has indicated his/her understanding and acceptance.     Dental advisory given  Plan Discussed with: CRNA  Anesthesia Plan Comments:        Anesthesia Quick Evaluation

## 2021-02-15 NOTE — Progress Notes (Signed)
ANTICOAGULATION CONSULT NOTE Pharmacy Consult for heparin  Indication: chest pain/ACS  No Known Allergies  Patient Measurements: Height: '5\' 8"'$  (172.7 cm) Weight: 93 kg (205 lb) IBW/kg (Calculated) : 68.4 HEPARIN DW (KG): 87.7   Vital Signs: Temp: 98.1 F (36.7 C) (08/13 2100) Temp Source: Oral (08/13 2100) BP: 135/85 (08/13 2100) Pulse Rate: 69 (08/13 2100)  Labs: Recent Labs    02/13/21 1125 02/14/21 0344 02/14/21 0406 02/14/21 0953 02/15/21 0652  HGB 16.0 16.4  --   --  16.7  HCT 47.0 47.9  --   --  47.5  PLT  --  184  --   --  194  HEPARINUNFRC  --   --  0.32 0.42 0.48  CREATININE  --   --   --   --  0.78    Estimated Creatinine Clearance: 100.5 mL/min (by C-G formula based on SCr of 0.78 mg/dL).  Assessment: 65 y.o. male with CAD s/p cath, awaiting CABG, for heparin.   Heparin level remains therapeutic at 1250 units/h. CBC wnl. No bleeding or issues with infusion per RN. Plans for CABG on Monday.   Goal of Therapy:  Heparin level 0.3-0.7 units/ml Monitor platelets by anticoagulation protocol: Yes   Plan:  Continue IV heparin 1250 units/h Daily HL and CBC while on heparin Follow up [facility] plan s/p CABG Monday   Rebbeca Paul, PharmD PGY2 Ambulatory Care Pharmacy Resident 02/15/2021 8:07 AM

## 2021-02-15 NOTE — Progress Notes (Signed)
Cardiology Progress Note  Patient ID: Jesus ARONHALT Sr. MRN: [id number] DOB: 06/01/1956 Date of Encounter: 02/15/2021  Primary Cardiologist: None  Subjective   Chief Complaint: None.  HPI: Ascending aorta 45 mm.  Thoracic surgery feels this will not need to be replaced at the time of CABG.  No chest pain.  Seems to be doing well.  Plans for CABG in the morning.  ROS:  All other ROS reviewed and negative. Pertinent positives noted in the HPI.     Inpatient Medications  Scheduled Meds:  amLODipine  10 mg Oral Daily   aspirin  81 mg Oral Daily   atorvastatin  80 mg Oral Daily   [START ON 02/16/2021] epinephrine  0-10 mcg/min Intravenous To OR   [START ON 02/16/2021] heparin-papaverine-plasmalyte irrigation   Irrigation To OR   insulin aspart  0-15 Units Subcutaneous TID WC   insulin aspart  0-5 Units Subcutaneous QHS   [START ON 02/16/2021] insulin   Intravenous To OR   [START ON 02/16/2021] [place] Blood Cardioplegia vial (lidocaine/magnesium/mannitol 0.26g-4g-6.4g)   Intracoronary Once   lisinopril  40 mg Oral Daily   metoprolol tartrate  25 mg Oral BID   [START ON 02/16/2021] phenylephrine  30-200 mcg/min Intravenous To OR   [START ON 02/16/2021] potassium chloride  80 mEq Other To OR   sodium chloride flush  3 mL Intravenous Q12H   [START ON 02/16/2021] tranexamic acid  15 mg/kg Intravenous To OR   [START ON 02/16/2021] tranexamic acid  2 mg/kg Intracatheter To OR   Continuous Infusions:  sodium chloride     [START ON 02/16/2021]  ceFAZolin (ANCEF) IV     [START ON 02/16/2021]  ceFAZolin (ANCEF) IV     [START ON 02/16/2021] dexmedetomidine     [START ON 02/16/2021] heparin 30,000 units/NS 1000 mL solution for CELLSAVER     heparin 1,250 Units/hr (02/14/21 1849)   [START ON 02/16/2021] milrinone     [START ON 02/16/2021] nitroGLYCERIN     [START ON 02/16/2021] norepinephrine     [START ON 02/16/2021] tranexamic acid (CYKLOKAPRON) infusion ([facility])     [START ON 02/16/2021] vancomycin      PRN Meds: sodium chloride, acetaminophen, ondansetron (ZOFRAN) IV, oxyCODONE, sodium chloride flush   Vital Signs   Vitals:   02/14/21 0818 02/14/21 1233 02/14/21 1724 02/14/21 2100  BP: 132/85 121/82 127/81 135/85  Pulse: 75 67 72 69  Resp: '16 18 18 18  '$ Temp: 97.8 F (36.6 C) 98.1 F (36.7 C)  98.1 F (36.7 C)  TempSrc: Oral Oral  Oral  SpO2:  95%  91%  Weight:      Height:        Intake/Output Summary (Last 24 hours) at 02/15/2021 0853 Last data filed at 02/15/2021 0531 Gross per 24 hour  Intake 1034.17 ml  Output 1000 ml  Net 34.17 ml   Last 3 Weights 02/13/2021 02/06/2021 01/23/2021  Weight (lbs) 205 lb 206 lb 206 lb 9.6 oz  Weight (kg) 92.987 kg 93.441 kg 93.713 kg      Telemetry  Overnight telemetry shows sinus rhythm in the 70s, which I personally reviewed.   Physical Exam   Vitals:   02/14/21 0818 02/14/21 1233 02/14/21 1724 02/14/21 2100  BP: 132/85 121/82 127/81 135/85  Pulse: 75 67 72 69  Resp: '16 18 18 18  '$ Temp: 97.8 F (36.6 C) 98.1 F (36.7 C)  98.1 F (36.7 C)  TempSrc: Oral Oral  Oral  SpO2:  95%  91%  Weight:  Height:        Intake/Output Summary (Last 24 hours) at 02/15/2021 0853 Last data filed at 02/15/2021 0531 Gross per 24 hour  Intake 1034.17 ml  Output 1000 ml  Net 34.17 ml    Last 3 Weights 02/13/2021 02/06/2021 01/23/2021  Weight (lbs) 205 lb 206 lb 206 lb 9.6 oz  Weight (kg) 92.987 kg 93.441 kg 93.713 kg    Body mass index is 31.17 kg/m.  General: Well nourished, well developed, in no acute distress Head: Atraumatic, normal size  Eyes: PEERLA, EOMI  Neck: Supple, no JVD Endocrine: No thryomegaly Cardiac: Normal S1, S2; RRR; no murmurs, rubs, or gallops Lungs: Clear to auscultation bilaterally, no wheezing, rhonchi or rales  Abd: Soft, nontender, no hepatomegaly  Ext: No edema, pulses 2+, right radial cath site clean and dry Musculoskeletal: No deformities, BUE and BLE strength normal and equal Skin: Warm and dry, no  rashes   Neuro: Alert and oriented to person, place, time, and situation, CNII-XII grossly intact, no focal deficits  Psych: Normal mood and affect   Labs  High Sensitivity Troponin:  No results for input(s): TROPONINIHS in the last 720 hours.   Cardiac EnzymesNo results for input(s): TROPONINI in the last 168 hours. No results for input(s): TROPIPOC in the last 168 hours.  Chemistry Recent Labs  Lab 02/10/21 1431 02/13/21 1120 02/13/21 1125 02/15/21 0652  NA 136 139  139 130* 134*  K 4.1 3.6  3.5 3.5 3.7  CL 100  --   --  101  CO2 22  --   --  24  GLUCOSE 127*  --   --  99  BUN 9  --   --  9  CREATININE 0.81  --   --  0.78  CALCIUM 9.4  --   --  8.9  PROT  --   --   --  6.9  ALBUMIN  --   --   --  3.6  AST  --   --   --  23  ALT  --   --   --  22  ALKPHOS  --   --   --  56  BILITOT  --   --   --  1.1  GFRNONAA  --   --   --  >60  ANIONGAP  --   --   --  9    Hematology Recent Labs  Lab 02/10/21 1431 02/13/21 1120 02/13/21 1125 02/14/21 0344 02/15/21 0652  WBC 9.0  --   --  9.4 8.6  RBC 5.90*  --   --  5.37 5.46  HGB 17.8*   < > 16.0 16.4 16.7  HCT 52.5*   < > 47.0 47.9 47.5  MCV 89  --   --  89.2 87.0  MCH 30.2  --   --  30.5 30.6  MCHC 33.9  --   --  34.2 35.2  RDW 13.7  --   --  14.0 13.9  PLT 235  --   --  184 194   < > = values in this interval not displayed.   BNP Recent Labs  Lab 02/15/21 0652  BNP 34.1    DDimer No results for input(s): DDIMER in the last 168 hours.   Radiology  DG Chest 2 View  Result Date: 02/14/2021 CLINICAL DATA:  Preoperative assessment prior to cardiac surgery. EXAM: CHEST - 2 VIEW COMPARISON:  Chest CT dated 02/14/2021. FINDINGS: The heart size and mediastinal contours  are within normal limits. Both lungs are clear. Degenerative changes are seen in the spine. IMPRESSION: No active cardiopulmonary disease. Electronically Signed   By: Zerita Boers M.D.   On: 02/14/2021 17:43   CARDIAC CATHETERIZATION  Result Date:  02/13/2021 Severe ostial LAD eccentric stenosis also partially involving the distal left main.  Slight reduction in LAD antegrade flow.  Lesion has the appearance of plaque rupture with dissection. Left main is large, greater than 6 mm in diameter Circumflex is widely patent Dominant and widely patent Hyperdynamic left ventricle.  LVEF greater than 55%. Dilated aorta RECOMMENDATIONS: LIMA to LAD 2D Doppler echocardiogram to assess aortic root size With decreased LAD flow, recommend keeping patient in-house, coordinate with surgeons, and manage accordingly.   ECHOCARDIOGRAM COMPLETE  Result Date: 02/13/2021    ECHOCARDIOGRAM REPORT   Patient Name:   Jesus TURI Sr. Date of Exam: 02/13/2021 Medical Rec #:  [id number]           Height:       68.0 in Accession #:    [id number]          Weight:       205.0 lb Date of Birth:  28-Dec-1955           BSA:          2.065 m Patient Age:    29 years            BP:           136/82 mmHg Patient Gender: M                   HR:           75 bpm. Exam Location:  Inpatient Procedure: 2D Echo Indications:    acute ischemic heart disease  History:        Patient has no prior history of Echocardiogram examinations.                 CAD; Risk Factors:Diabetes and Hypertension.  Sonographer:    Johny Chess RDCS Referring Phys: [place]  1. Left ventricular ejection fraction, by estimation, is 60 to 65%. The left ventricle has normal function. The left ventricle has no regional wall motion abnormalities. Left ventricular diastolic parameters are consistent with Grade I diastolic dysfunction (impaired relaxation).  2. Right ventricular systolic function is normal. The right ventricular size is normal. There is normal pulmonary artery systolic pressure.  3. The mitral valve is normal in structure. No evidence of mitral valve regurgitation. No evidence of mitral stenosis.  4. The aortic valve is tricuspid. Aortic valve regurgitation is mild. No aortic stenosis  is present.  5. Aortic dilatation noted. There is moderate dilatation of the ascending aorta, measuring 45 mm.  6. The inferior vena cava is normal in size with greater than 50% respiratory variability, suggesting right atrial pressure of 3 mmHg. Conclusion(s)/Recommendation(s): Recommend gated CTA of aorta to verify 45 mm ascending aorta. FINDINGS  Left Ventricle: Left ventricular ejection fraction, by estimation, is 60 to 65%. The left ventricle has normal function. The left ventricle has no regional wall motion abnormalities. The left ventricular internal cavity size was normal in size. There is  no left ventricular hypertrophy. Left ventricular diastolic parameters are consistent with Grade I diastolic dysfunction (impaired relaxation). Right Ventricle: The right ventricular size is normal. No increase in right ventricular wall thickness. Right ventricular systolic function is normal. There is normal pulmonary artery systolic pressure. The tricuspid  regurgitant velocity is 1.95 m/s, and  with an assumed right atrial pressure of 3 mmHg, the estimated right ventricular systolic pressure is 123XX123 mmHg. Left Atrium: Left atrial size was normal in size. Right Atrium: Right atrial size was normal in size. Pericardium: There is no evidence of pericardial effusion. Mitral Valve: The mitral valve is normal in structure. No evidence of mitral valve regurgitation. No evidence of mitral valve stenosis. Tricuspid Valve: The tricuspid valve is normal in structure. Tricuspid valve regurgitation is not demonstrated. No evidence of tricuspid stenosis. Aortic Valve: The aortic valve is tricuspid. Aortic valve regurgitation is mild. Aortic regurgitation PHT measures 508 msec. No aortic stenosis is present. Pulmonic Valve: The pulmonic valve was normal in structure. Pulmonic valve regurgitation is not visualized. No evidence of pulmonic stenosis. Aorta: Aortic dilatation noted. There is moderate dilatation of the ascending aorta,  measuring 45 mm. Venous: The inferior vena cava is normal in size with greater than 50% respiratory variability, suggesting right atrial pressure of 3 mmHg. IAS/Shunts: No atrial level shunt detected by color flow Doppler.  LEFT VENTRICLE PLAX 2D LVIDd:         4.50 cm  Diastology LVIDs:         2.60 cm  LV e' medial:    7.72 cm/s LV PW:         1.10 cm  LV E/e' medial:  11.0 LV IVS:        1.00 cm  LV e' lateral:   7.29 cm/s LVOT diam:     2.30 cm  LV E/e' lateral: 11.6 LV SV:         86 LV SV Index:   42 LVOT Area:     4.15 cm  RIGHT VENTRICLE             IVC RV S prime:     11.60 cm/s  IVC diam: 1.50 cm TAPSE (M-mode): 2.4 cm LEFT ATRIUM             Index       RIGHT ATRIUM           Index LA diam:        3.30 cm 1.60 cm/m  RA Area:     17.10 cm LA Vol (A2C):   34.5 ml 16.70 ml/m RA Volume:   44.00 ml  21.30 ml/m LA Vol (A4C):   40.8 ml 19.76 ml/m LA Biplane Vol: 37.4 ml 18.11 ml/m  AORTIC VALVE LVOT Vmax:   89.70 cm/s LVOT Vmean:  60.300 cm/s LVOT VTI:    0.208 m AI PHT:      508 msec  AORTA Ao Root diam: 3.50 cm Ao Asc diam:  4.25 cm MITRAL VALVE                TRICUSPID VALVE MV Area (PHT): 3.53 cm     TR Peak grad:   15.2 mmHg MV Decel Time: 215 msec     TR Vmax:        195.00 cm/s MV E velocity: 84.60 cm/s MV A velocity: 101.00 cm/s  SHUNTS MV E/A ratio:  0.84         Systemic VTI:  0.21 m                             Systemic Diam: 2.30 cm Candee Furbish MD Electronically signed by Candee Furbish MD Signature Date/Time: 02/13/2021/4:44:27 PM    Final  VAS US DOPPLER PRE CABG  Result Date: 02/14/2021 PREOPERATIVE VASCULAR EVALUATION Patient Name:  Jesus ZUK Sr.  Date of Exam:   02/13/2021 Medical Rec #: [id number]            Accession #:    [id number] Date of Birth: 08/12/1955            Patient Gender: M Patient Age:   58 years Exam Location:  [facility] Procedure:      VAS US DOPPLER PRE CABG Referring Phys: HARRELL LIGHTFOOT  --------------------------------------------------------------------------------  Indications:      Pre-CABG. Risk Factors:     Hypertension, Diabetes. Comparison Study: No prior studies. Performing Technologist: Carlos Levering RVT  Examination Guidelines: A complete evaluation includes B-mode imaging, spectral Doppler, color Doppler, and power Doppler as needed of all accessible portions of each vessel. Bilateral testing is considered an integral part of a complete examination. Limited examinations for reoccurring indications may be performed as noted.  Right Carotid Findings: +----------+--------+--------+--------+-----------------------+--------+           PSV cm/sEDV cm/sStenosisDescribe               Comments +----------+--------+--------+--------+-----------------------+--------+ CCA Prox  73      13              smooth and heterogenous         +----------+--------+--------+--------+-----------------------+--------+ CCA Distal40      7               smooth and heterogenous         +----------+--------+--------+--------+-----------------------+--------+ ICA Prox  29      8               smooth and heterogenous         +----------+--------+--------+--------+-----------------------+--------+ ICA Distal55      19                                     tortuous +----------+--------+--------+--------+-----------------------+--------+ ECA       72      12                                              +----------+--------+--------+--------+-----------------------+--------+ +----------+--------+-------+--------+------------+           PSV cm/sEDV cmsDescribeArm Pressure +----------+--------+-------+--------+------------+ Subclavian86                                  +----------+--------+-------+--------+------------+ +---------+--------+--+--------+--+---------+ VertebralPSV cm/s32EDV cm/s12Antegrade +---------+--------+--+--------+--+---------+ Left Carotid  Findings: +----------+--------+--------+--------+-----------------------+--------+           PSV cm/sEDV cm/sStenosisDescribe               Comments +----------+--------+--------+--------+-----------------------+--------+ CCA Prox  80      14              smooth and heterogenous         +----------+--------+--------+--------+-----------------------+--------+ CCA Distal49      10              smooth and heterogenous         +----------+--------+--------+--------+-----------------------+--------+ ICA Prox  41      14              smooth and heterogenous         +----------+--------+--------+--------+-----------------------+--------+  ICA Distal47      15                                     tortuous +----------+--------+--------+--------+-----------------------+--------+ ECA       102     14                                              +----------+--------+--------+--------+-----------------------+--------+ +----------+--------+--------+--------+------------+ SubclavianPSV cm/sEDV cm/sDescribeArm Pressure +----------+--------+--------+--------+------------+           75                                   +----------+--------+--------+--------+------------+ +---------+--------+--+--------+-+---------+ VertebralPSV cm/s25EDV cm/s7Antegrade +---------+--------+--+--------+-+---------+  ABI Findings: +--------+------------------+-----+---------+--------+ Right   Rt Pressure (mmHg)IndexWaveform Comment  +--------+------------------+-----+---------+--------+ Brachial                                TR band  +--------+------------------+-----+---------+--------+ PTA     168               1.15 triphasic         +--------+------------------+-----+---------+--------+ DP      167               1.14 triphasic         +--------+------------------+-----+---------+--------+ +--------+------------------+-----+---------+-------+ Left    Lt Pressure  (mmHg)IndexWaveform Comment +--------+------------------+-----+---------+-------+ [id number]                    triphasic        +--------+------------------+-----+---------+-------+ PTA     158               1.08 triphasic        +--------+------------------+-----+---------+-------+ DP      165               1.13 triphasic        +--------+------------------+-----+---------+-------+ +-------+---------------+----------------+ ABI/TBIToday's ABI/TBIPrevious ABI/TBI +-------+---------------+----------------+ Right  1.15                            +-------+---------------+----------------+ Left   1.13                            +-------+---------------+----------------+  Right Doppler Findings: +--------+--------+-----+-------+--------+ Site    PressureIndexDopplerComments +--------+--------+-----+-------+--------+ Brachial                    TR band  +--------+--------+-----+-------+--------+  Left Doppler Findings: +--------+--------+-----+---------+--------+ Site    PressureIndexDoppler  Comments +--------+--------+-----+---------+--------+ [id number]          triphasic         +--------+--------+-----+---------+--------+ Radial               triphasic         +--------+--------+-----+---------+--------+ Ulnar                triphasic         +--------+--------+-----+---------+--------+  Summary: Right Carotid: Velocities in the right ICA are consistent with a 1-39% stenosis. Left Carotid: Velocities in the left ICA are consistent with a 1-39% stenosis. Vertebrals: Bilateral vertebral arteries demonstrate antegrade flow. Right ABI: Resting  right ankle-brachial index is within normal range. No evidence of significant right lower extremity arterial disease. Left ABI: Resting left ankle-brachial index is within normal range. No evidence of significant left lower extremity arterial disease. Left Upper Extremity: Doppler waveform obliterate with left  radial compression. Doppler waveforms remain within normal limits with left ulnar compression.  Electronically signed by Harold Barban MD on 02/14/2021 at 1:24:35 PM.    Final    CT ANGIO CHEST AORTA W/ & OR WO/CM & GATING ([place] ONLY)  Result Date: 02/14/2021 CLINICAL DATA:  Thoracic aortic aneurysm, follow-up. 4.5 cm ascending aorta reported on ECHO. EXAM: CT ANGIOGRAPHY CHEST WITH CONTRAST TECHNIQUE: Noncontrast CT was performed. Multidetector CT imaging of the chest was performed using the standard protocol during bolus administration of intravenous contrast. Multiplanar CT image reconstructions and MIPs were obtained to evaluate the vascular anatomy. CONTRAST:  43m OMNIPAQUE IOHEXOL 350 MG/ML SOLN COMPARISON:  None available FINDINGS: Cardiovascular: Heart size normal. No pericardial effusion. Incomplete opacification of the pulmonary arterial tree; the exam was not optimized for detection of pulmonary emboli. Moderate coronary calcifications. Good contrast opacification of the thoracic aorta. No dissection or stenosis. Bovine variant brachiocephalic arterial origin anatomy without proximal stenosis. Scattered calcified plaque. Aortic Root: --Valve: 2.7 cm --Sinuses: 4.3 cm --Sinotubular Junction: 3.6 cm Limitations by motion: Mild Thoracic Aorta: --Ascending Aorta: 4.5 cm --Aortic Arch: 3.8 cm --Descending Aorta: 3.3 cm Mediastinum/Nodes: No mass or adenopathy. Lungs/Pleura: No pleural effusion. No pneumothorax. Lungs are clear. Upper Abdomen: Fatty liver.  No acute findings. Musculoskeletal: No chest wall abnormality. No acute or significant osseous findings. Review of the MIP images confirms the above findings. IMPRESSION: 1. 4.5 cm ascending thoracic aortic aneurysm. Recommend semi-annual imaging followup by CTA or MRA and referral to cardiothoracic surgery if not already obtained. This recommendation follows 2010 ACCF/AHA/AATS/ACR/ASA/SCA/SCAI/SIR/STS/SVM Guidelines for the Diagnosis and  Management of Patients With Thoracic Aortic Disease. Circulation. 2010; 121: [id number]. Coronary calcifications. The severity of coronary artery disease and any potential stenosis cannot be assessed on this non-gated CT examination. Assessment for potential risk factor modification, dietary therapy or pharmacologic therapy may be warranted, if clinically indicated. 3. Fatty liver. Electronically Signed   By: DLucrezia EuropeM.D.   On: 02/14/2021 12:08    Cardiac Studies  TTE 02/13/2021  1. Left ventricular ejection fraction, by estimation, is 60 to 65%. The  left ventricle has normal function. The left ventricle has no regional  wall motion abnormalities. Left ventricular diastolic parameters are  consistent with Grade I diastolic  dysfunction (impaired relaxation).   2. Right ventricular systolic function is normal. The right ventricular  size is normal. There is normal pulmonary artery systolic pressure.   3. The mitral valve is normal in structure. No evidence of mitral valve  regurgitation. No evidence of mitral stenosis.   4. The aortic valve is tricuspid. Aortic valve regurgitation is mild. No  aortic stenosis is present.   5. Aortic dilatation noted. There is moderate dilatation of the ascending  aorta, measuring 45 mm.   6. The inferior vena cava is normal in size with greater than 50%  respiratory variability, suggesting right atrial pressure of 3 mmHg.   CTA Chest  02/14/2021 IMPRESSION: 1. 4.5 cm ascending thoracic aortic aneurysm. Recommend semi-annual imaging followup by CTA or MRA and referral to cardiothoracic surgery if not already obtained. This recommendation follows 2010 ACCF/AHA/AATS/ACR/ASA/SCA/SCAI/SIR/STS/SVM Guidelines for the Diagnosis and Management of Patients With Thoracic Aortic Disease. Circulation. 2010; 121: [id number]. Coronary calcifications. The  severity of coronary artery disease and any potential stenosis cannot be assessed on this non-gated  CT examination. Assessment for potential risk factor modification, dietary therapy or pharmacologic therapy may be warranted, if clinically indicated. 3. Fatty liver.  Patient Profile  Jesus OATHOUT Sr. is a 65 y.o. male with diabetes, hypertension, obesity, tobacco abuse was admitted on 02/13/2021 with unstable angina and severe ostial LAD stenosis.  Currently awaiting bypass.  Assessment & Plan   Unstable angina -Left heart catheterization with severe ostial LAD stenosis that is not amendable to PCI.  Plans for CABG tomorrow. -Continue aspirin heparin drip. -He is on metoprolol tartrate 25 twice daily.  Continue home antihypertensive agents.  Normal LV function on echo. -BNP normal.  LDL 66.  A1c 6.3. -Continue high intensity statin. -N.p.o. at midnight for CABG.  2.  Diastolic heart failure -Euvolemic on exam.  Normal pulmonary pressures and wedge pressure on right heart cath.  BNP normal.  Echo unremarkable.  3.  Thoracic aortic aneurysm -CT confirms ascending aorta is 45 mm. -Thoracic surgery believes he will not need replacement. -Tricuspid aortic valve. -I agree in the setting of a tricuspid aortic valve would not recommend replacement.  4.  Hyperlipidemia -High intensity statin.  5.  Diabetes -Hold pioglitazone.  A1c 6.3.  If he has heart failure may not be a good medication moving forward.  Would consider metformin at discharge.  FEN -no IVF -code: full -diet: NPO at midnight -dvt ppx: heparin drip   For questions or updates, please contact [place] Please consult [email or web address] for contact info under   Time Spent with Patient: I have spent a total of 35 minutes with patient reviewing hospital notes, telemetry, EKGs, labs and examining the patient as well as establishing an assessment and plan that was discussed with the patient.  > 50% of time was spent in direct patient care.    Signed, Addison Naegeli. Audie Box, MD, [place]  02/15/2021  8:53 AM

## 2021-02-15 NOTE — Progress Notes (Signed)
Patient with occasional chest tightness, but takes a deep breath and tries to relax and it subsides. He hopes to not wake up with breathing tube. We did discuss goal, as long as patient is stable, it to remove ETT within about 6 hours after surgery. CTA showed 4.5 cm ATAA and fatty liver. Patient's ATAA to be followed post up with yearly scans. Patient to OR in am for CABG with Dr. Kipp Brood.

## 2021-02-15 NOTE — Progress Notes (Signed)
Patient watched heart surgery videos

## 2021-02-16 ENCOUNTER — Inpatient Hospital Stay (HOSPITAL_COMMUNITY): Payer: Medicare Other | Admitting: Anesthesiology

## 2021-02-16 ENCOUNTER — Inpatient Hospital Stay (HOSPITAL_COMMUNITY)
Admission: RE | Disposition: A | Discharge status: Home / Self Care | Payer: Self-pay | Source: Home / Self Care | Attending: Thoracic Surgery (Cardiothoracic Vascular Surgery)

## 2021-02-16 ENCOUNTER — Inpatient Hospital Stay (HOSPITAL_COMMUNITY): Payer: Medicare Other

## 2021-02-16 DIAGNOSIS — I25110 Atherosclerotic heart disease of native coronary artery with unstable angina pectoris: Secondary | ICD-10-CM | POA: Diagnosis not present

## 2021-02-16 DIAGNOSIS — I214 Non-ST elevation (NSTEMI) myocardial infarction: Secondary | ICD-10-CM | POA: Diagnosis not present

## 2021-02-16 DIAGNOSIS — Z951 Presence of aortocoronary bypass graft: Secondary | ICD-10-CM

## 2021-02-16 HISTORY — PX: CORONARY ARTERY BYPASS GRAFT: SHX141

## 2021-02-16 HISTORY — PX: ENDOVEIN HARVEST OF GREATER SAPHENOUS VEIN: SHX5059

## 2021-02-16 HISTORY — PX: TEE WITHOUT CARDIOVERSION: SHX5443

## 2021-02-16 LAB — CBC
HCT: 45.4 % (ref 39.0–52.0)
HCT: 40.8 % (ref 39.0–52.0)
HCT: 38.6 % — ABNORMAL LOW (ref 39.0–52.0)
Hemoglobin: 15.9 g/dL (ref 13.0–17.0)
Hemoglobin: 13.6 g/dL (ref 13.0–17.0)
Hemoglobin: 13.1 g/dL (ref 13.0–17.0)
MCH: 30.6 pg (ref 26.0–34.0)
MCH: 30.1 pg (ref 26.0–34.0)
MCH: 30.5 pg (ref 26.0–34.0)
MCHC: 35 g/dL (ref 30.0–36.0)
MCHC: 33.3 g/dL (ref 30.0–36.0)
MCHC: 33.9 g/dL (ref 30.0–36.0)
MCV: 87.3 fL (ref 80.0–100.0)
MCV: 90.3 fL (ref 80.0–100.0)
MCV: 90 fL (ref 80.0–100.0)
Platelets: 196 10*3/uL (ref 150–400)
Platelets: 147 10*3/uL — ABNORMAL LOW (ref 150–400)
Platelets: 142 10*3/uL — ABNORMAL LOW (ref 150–400)
RBC: 5.2 MIL/uL (ref 4.22–5.81)
RBC: 4.52 MIL/uL (ref 4.22–5.81)
RBC: 4.29 MIL/uL (ref 4.22–5.81)
RDW: 13.9 % (ref 11.5–15.5)
RDW: 14 % (ref 11.5–15.5)
RDW: 14.1 % (ref 11.5–15.5)
WBC: 9.9 10*3/uL (ref 4.0–10.5)
WBC: 19 10*3/uL — ABNORMAL HIGH (ref 4.0–10.5)
WBC: 15.3 10*3/uL — ABNORMAL HIGH (ref 4.0–10.5)
nRBC: 0 % (ref 0.0–0.2)
nRBC: 0 % (ref 0.0–0.2)
nRBC: 0 % (ref 0.0–0.2)

## 2021-02-16 LAB — POCT I-STAT 7, (LYTES, BLD GAS, ICA,H+H)
Acid-Base Excess: 1 mmol/L (ref 0.0–2.0)
Acid-base deficit: 1 mmol/L (ref 0.0–2.0)
Acid-base deficit: 3 mmol/L — ABNORMAL HIGH (ref 0.0–2.0)
Acid-base deficit: 12 mmol/L — ABNORMAL HIGH (ref 0.0–2.0)
Acid-base deficit: 12 mmol/L — ABNORMAL HIGH (ref 0.0–2.0)
Acid-base deficit: 3 mmol/L — ABNORMAL HIGH (ref 0.0–2.0)
Acid-base deficit: 4 mmol/L — ABNORMAL HIGH (ref 0.0–2.0)
Bicarbonate: 27 mmol/L (ref 20.0–28.0)
Bicarbonate: 22.4 mmol/L (ref 20.0–28.0)
Bicarbonate: 23.9 mmol/L (ref 20.0–28.0)
Bicarbonate: 15.1 mmol/L — ABNORMAL LOW (ref 20.0–28.0)
Bicarbonate: 13.8 mmol/L — ABNORMAL LOW (ref 20.0–28.0)
Bicarbonate: 22.9 mmol/L (ref 20.0–28.0)
Bicarbonate: 23.3 mmol/L (ref 20.0–28.0)
Calcium, Ion: 1.1 mmol/L — ABNORMAL LOW (ref 1.15–1.40)
Calcium, Ion: 1.02 mmol/L — ABNORMAL LOW (ref 1.15–1.40)
Calcium, Ion: 1.31 mmol/L (ref 1.15–1.40)
Calcium, Ion: 0.95 mmol/L — ABNORMAL LOW (ref 1.15–1.40)
Calcium, Ion: 0.83 mmol/L — CL (ref 1.15–1.40)
Calcium, Ion: 1.11 mmol/L — ABNORMAL LOW (ref 1.15–1.40)
Calcium, Ion: 1.13 mmol/L — ABNORMAL LOW (ref 1.15–1.40)
HCT: 35 % — ABNORMAL LOW (ref 39.0–52.0)
HCT: 33 % — ABNORMAL LOW (ref 39.0–52.0)
HCT: 33 % — ABNORMAL LOW (ref 39.0–52.0)
HCT: 28 % — ABNORMAL LOW (ref 39.0–52.0)
HCT: 23 % — ABNORMAL LOW (ref 39.0–52.0)
HCT: 37 % — ABNORMAL LOW (ref 39.0–52.0)
HCT: 39 % (ref 39.0–52.0)
Hemoglobin: 11.9 g/dL — ABNORMAL LOW (ref 13.0–17.0)
Hemoglobin: 11.2 g/dL — ABNORMAL LOW (ref 13.0–17.0)
Hemoglobin: 11.2 g/dL — ABNORMAL LOW (ref 13.0–17.0)
Hemoglobin: 9.5 g/dL — ABNORMAL LOW (ref 13.0–17.0)
Hemoglobin: 7.8 g/dL — ABNORMAL LOW (ref 13.0–17.0)
Hemoglobin: 12.6 g/dL — ABNORMAL LOW (ref 13.0–17.0)
Hemoglobin: 13.3 g/dL (ref 13.0–17.0)
O2 Saturation: 100 %
O2 Saturation: 100 %
O2 Saturation: 97 %
O2 Saturation: 90 %
O2 Saturation: 93 %
O2 Saturation: 90 %
O2 Saturation: 92 %
Patient temperature: 36.4
Patient temperature: 35.4
Patient temperature: 36.2
Patient temperature: 36.8
Potassium: 3.2 mmol/L — ABNORMAL LOW (ref 3.5–5.1)
Potassium: 4.3 mmol/L (ref 3.5–5.1)
Potassium: 4.7 mmol/L (ref 3.5–5.1)
Potassium: 2.6 mmol/L — CL (ref 3.5–5.1)
Potassium: 2.5 mmol/L — CL (ref 3.5–5.1)
Potassium: 4.2 mmol/L (ref 3.5–5.1)
Potassium: 4.2 mmol/L (ref 3.5–5.1)
Sodium: 138 mmol/L (ref 135–145)
Sodium: 134 mmol/L — ABNORMAL LOW (ref 135–145)
Sodium: 135 mmol/L (ref 135–145)
Sodium: 147 mmol/L — ABNORMAL HIGH (ref 135–145)
Sodium: 148 mmol/L — ABNORMAL HIGH (ref 135–145)
Sodium: 139 mmol/L (ref 135–145)
Sodium: 140 mmol/L (ref 135–145)
TCO2: 28 mmol/L (ref 22–32)
TCO2: 24 mmol/L (ref 22–32)
TCO2: 25 mmol/L (ref 22–32)
TCO2: 16 mmol/L — ABNORMAL LOW (ref 22–32)
TCO2: 15 mmol/L — ABNORMAL LOW (ref 22–32)
TCO2: 24 mmol/L (ref 22–32)
TCO2: 25 mmol/L (ref 22–32)
pCO2 arterial: 48.5 mmHg — ABNORMAL HIGH (ref 32.0–48.0)
pCO2 arterial: 39.5 mmHg (ref 32.0–48.0)
pCO2 arterial: 40.1 mmHg (ref 32.0–48.0)
pCO2 arterial: 36.1 mmHg (ref 32.0–48.0)
pCO2 arterial: 27.5 mmHg — ABNORMAL LOW (ref 32.0–48.0)
pCO2 arterial: 43.4 mmHg (ref 32.0–48.0)
pCO2 arterial: 48.1 mmHg — ABNORMAL HIGH (ref 32.0–48.0)
pH, Arterial: 7.354 (ref 7.350–7.450)
pH, Arterial: 7.355 (ref 7.350–7.450)
pH, Arterial: 7.39 (ref 7.350–7.450)
pH, Arterial: 7.226 — ABNORMAL LOW (ref 7.350–7.450)
pH, Arterial: 7.301 — ABNORMAL LOW (ref 7.350–7.450)
pH, Arterial: 7.326 — ABNORMAL LOW (ref 7.350–7.450)
pH, Arterial: 7.292 — ABNORMAL LOW (ref 7.350–7.450)
pO2, Arterial: 355 mmHg — ABNORMAL HIGH (ref 83.0–108.0)
pO2, Arterial: 288 mmHg — ABNORMAL HIGH (ref 83.0–108.0)
pO2, Arterial: 99 mmHg (ref 83.0–108.0)
pO2, Arterial: 66 mmHg — ABNORMAL LOW (ref 83.0–108.0)
pO2, Arterial: 66 mmHg — ABNORMAL LOW (ref 83.0–108.0)
pO2, Arterial: 61 mmHg — ABNORMAL LOW (ref 83.0–108.0)
pO2, Arterial: 71 mmHg — ABNORMAL LOW (ref 83.0–108.0)

## 2021-02-16 LAB — ECHO INTRAOPERATIVE TEE
AV Mean grad: 4 mmHg
AV Peak grad: 6.7 mmHg
Ao pk vel: 1.29 m/s
Height: 68 in
P 1/2 time: 1060 msec
Weight: 3280 oz

## 2021-02-16 LAB — BASIC METABOLIC PANEL
Anion gap: 8 (ref 5–15)
Anion gap: 5 (ref 5–15)
Anion gap: 7 (ref 5–15)
BUN: 8 mg/dL (ref 8–23)
BUN: 7 mg/dL — ABNORMAL LOW (ref 8–23)
BUN: 8 mg/dL (ref 8–23)
CO2: 26 mmol/L (ref 22–32)
CO2: 22 mmol/L (ref 22–32)
CO2: 22 mmol/L (ref 22–32)
Calcium: 9.1 mg/dL (ref 8.9–10.3)
Calcium: 6.7 mg/dL — ABNORMAL LOW (ref 8.9–10.3)
Calcium: 7.2 mg/dL — ABNORMAL LOW (ref 8.9–10.3)
Chloride: 102 mmol/L (ref 98–111)
Chloride: 111 mmol/L (ref 98–111)
Chloride: 109 mmol/L (ref 98–111)
Creatinine, Ser: 0.84 mg/dL (ref 0.61–1.24)
Creatinine, Ser: 0.54 mg/dL — ABNORMAL LOW (ref 0.61–1.24)
Creatinine, Ser: 0.57 mg/dL — ABNORMAL LOW (ref 0.61–1.24)
GFR, Estimated: >60 mL/min (ref >60)
GFR, Estimated: >60 mL/min (ref >60)
GFR, Estimated: >60 mL/min (ref >60)
Glucose, Bld: 94 mg/dL (ref 70–99)
Glucose, Bld: 120 mg/dL — ABNORMAL HIGH (ref 70–99)
Glucose, Bld: 114 mg/dL — ABNORMAL HIGH (ref 70–99)
Potassium: 3.4 mmol/L — ABNORMAL LOW (ref 3.5–5.1)
Potassium: 3.4 mmol/L — ABNORMAL LOW (ref 3.5–5.1)
Potassium: 3.5 mmol/L (ref 3.5–5.1)
Sodium: 136 mmol/L (ref 135–145)
Sodium: 138 mmol/L (ref 135–145)
Sodium: 138 mmol/L (ref 135–145)

## 2021-02-16 LAB — POCT I-STAT, CHEM 8
BUN: 10 mg/dL (ref 8–23)
BUN: 9 mg/dL (ref 8–23)
BUN: 9 mg/dL (ref 8–23)
BUN: 9 mg/dL (ref 8–23)
Calcium, Ion: 1.25 mmol/L (ref 1.15–1.40)
Calcium, Ion: 1.25 mmol/L (ref 1.15–1.40)
Calcium, Ion: 1.07 mmol/L — ABNORMAL LOW (ref 1.15–1.40)
Calcium, Ion: 1.31 mmol/L (ref 1.15–1.40)
Chloride: 101 mmol/L (ref 98–111)
Chloride: 100 mmol/L (ref 98–111)
Chloride: 101 mmol/L (ref 98–111)
Chloride: 102 mmol/L (ref 98–111)
Creatinine, Ser: 0.6 mg/dL — ABNORMAL LOW (ref 0.61–1.24)
Creatinine, Ser: 0.6 mg/dL — ABNORMAL LOW (ref 0.61–1.24)
Creatinine, Ser: 0.5 mg/dL — ABNORMAL LOW (ref 0.61–1.24)
Creatinine, Ser: 0.5 mg/dL — ABNORMAL LOW (ref 0.61–1.24)
Glucose, Bld: 119 mg/dL — ABNORMAL HIGH (ref 70–99)
Glucose, Bld: 120 mg/dL — ABNORMAL HIGH (ref 70–99)
Glucose, Bld: 103 mg/dL — ABNORMAL HIGH (ref 70–99)
Glucose, Bld: 120 mg/dL — ABNORMAL HIGH (ref 70–99)
HCT: 46 % (ref 39.0–52.0)
HCT: 44 % (ref 39.0–52.0)
HCT: 34 % — ABNORMAL LOW (ref 39.0–52.0)
HCT: 33 % — ABNORMAL LOW (ref 39.0–52.0)
Hemoglobin: 15.6 g/dL (ref 13.0–17.0)
Hemoglobin: 15 g/dL (ref 13.0–17.0)
Hemoglobin: 11.6 g/dL — ABNORMAL LOW (ref 13.0–17.0)
Hemoglobin: 11.2 g/dL — ABNORMAL LOW (ref 13.0–17.0)
Potassium: 3.5 mmol/L (ref 3.5–5.1)
Potassium: 3.4 mmol/L — ABNORMAL LOW (ref 3.5–5.1)
Potassium: 4.3 mmol/L (ref 3.5–5.1)
Potassium: 4.2 mmol/L (ref 3.5–5.1)
Sodium: 138 mmol/L (ref 135–145)
Sodium: 137 mmol/L (ref 135–145)
Sodium: 135 mmol/L (ref 135–145)
Sodium: 135 mmol/L (ref 135–145)
TCO2: 26 mmol/L (ref 22–32)
TCO2: 25 mmol/L (ref 22–32)
TCO2: 23 mmol/L (ref 22–32)
TCO2: 23 mmol/L (ref 22–32)

## 2021-02-16 LAB — POCT I-STAT EG7
Acid-base deficit: 1 mmol/L (ref 0.0–2.0)
Bicarbonate: 24.3 mmol/L (ref 20.0–28.0)
Calcium, Ion: 1.1 mmol/L — ABNORMAL LOW (ref 1.15–1.40)
HCT: 36 % — ABNORMAL LOW (ref 39.0–52.0)
Hemoglobin: 12.2 g/dL — ABNORMAL LOW (ref 13.0–17.0)
O2 Saturation: 85 %
Potassium: 4.1 mmol/L (ref 3.5–5.1)
Sodium: 137 mmol/L (ref 135–145)
TCO2: 25 mmol/L (ref 22–32)
pCO2, Ven: 41.3 mmHg — ABNORMAL LOW (ref 44.0–60.0)
pH, Ven: 7.377 (ref 7.250–7.430)
pO2, Ven: 51 mmHg — ABNORMAL HIGH (ref 32.0–45.0)

## 2021-02-16 LAB — APTT: aPTT: 39 seconds — ABNORMAL HIGH (ref 24–36)

## 2021-02-16 LAB — MAGNESIUM: Magnesium: 2.3 mg/dL (ref 1.7–2.4)

## 2021-02-16 LAB — GLUCOSE, CAPILLARY
Glucose-Capillary: 115 mg/dL — ABNORMAL HIGH (ref 70–99)
Glucose-Capillary: 82 mg/dL (ref 70–99)
Glucose-Capillary: 91 mg/dL (ref 70–99)
Glucose-Capillary: 81 mg/dL (ref 70–99)
Glucose-Capillary: 134 mg/dL — ABNORMAL HIGH (ref 70–99)
Glucose-Capillary: 130 mg/dL — ABNORMAL HIGH (ref 70–99)
Glucose-Capillary: 136 mg/dL — ABNORMAL HIGH (ref 70–99)
Glucose-Capillary: 150 mg/dL — ABNORMAL HIGH (ref 70–99)
Glucose-Capillary: 79 mg/dL (ref 70–99)
Glucose-Capillary: 92 mg/dL (ref 70–99)
Glucose-Capillary: 103 mg/dL — ABNORMAL HIGH (ref 70–99)
Glucose-Capillary: 87 mg/dL (ref 70–99)

## 2021-02-16 LAB — PROTIME-INR
INR: 1.5 — ABNORMAL HIGH (ref 0.8–1.2)
Prothrombin Time: 18.3 seconds — ABNORMAL HIGH (ref 11.4–15.2)

## 2021-02-16 LAB — HEPARIN LEVEL (UNFRACTIONATED): Heparin Unfractionated: 0.42 IU/mL (ref 0.30–0.70)

## 2021-02-16 LAB — HEMOGLOBIN AND HEMATOCRIT, BLOOD
HCT: 36.5 % — ABNORMAL LOW (ref 39.0–52.0)
Hemoglobin: 12.7 g/dL — ABNORMAL LOW (ref 13.0–17.0)

## 2021-02-16 LAB — PLATELET COUNT: Platelets: 189 10*3/uL (ref 150–400)

## 2021-02-16 SURGERY — CORONARY ARTERY BYPASS GRAFTING (CABG)
Anesthesia: General | Site: Chest | Laterality: Right

## 2021-02-16 MED ORDER — 0.9 % SODIUM CHLORIDE (POUR BTL) OPTIME
TOPICAL | Status: DC | PRN
  Administered 2021-02-16: 5000 mL

## 2021-02-16 MED ORDER — ACETAMINOPHEN 650 MG RE SUPP: 650.0000 mg | Freq: Once | RECTAL | Status: DC

## 2021-02-16 MED ORDER — HEPARIN SODIUM (PORCINE) 1000 UNIT/ML IJ SOLN
INTRAMUSCULAR | Status: AC
  Filled 2021-02-16: qty 1

## 2021-02-16 MED ORDER — PROTAMINE SULFATE 10 MG/ML IV SOLN
INTRAVENOUS | Status: DC | PRN
  Administered 2021-02-16: 290 mg via INTRAVENOUS

## 2021-02-16 MED ORDER — SODIUM CHLORIDE 0.9 % IV SOLN: 250.0000 mL | INTRAVENOUS | Status: DC

## 2021-02-16 MED ORDER — SODIUM CHLORIDE (PF) 0.9 % IJ SOLN
OROMUCOSAL | Status: DC | PRN
  Administered 2021-02-16 (×3): 4 mL via TOPICAL

## 2021-02-16 MED ORDER — SODIUM CHLORIDE 0.9 % IV SOLN: INTRAVENOUS | Status: DC | PRN

## 2021-02-16 MED ORDER — VANCOMYCIN HCL IN DEXTROSE 1-5 GM/200ML-% IV SOLN
1000.0000 mg | Freq: Once | INTRAVENOUS | Status: AC
  Administered 2021-02-16: 1000 mg via INTRAVENOUS
  Filled 2021-02-16: qty 200

## 2021-02-16 MED ORDER — ARTIFICIAL TEARS OPHTHALMIC OINT
TOPICAL_OINTMENT | OPHTHALMIC | Status: AC
  Filled 2021-02-16: qty 3.5

## 2021-02-16 MED ORDER — ACETAMINOPHEN 160 MG/5ML PO SOLN: 650.0000 mg | Freq: Once | ORAL | Status: DC

## 2021-02-16 MED ORDER — ACETAMINOPHEN 500 MG PO TABS
1000.0000 mg | ORAL_TABLET | Freq: Four times a day (QID) | ORAL | Status: AC
  Administered 2021-02-16 – 2021-02-21 (×20): 1000 mg via ORAL
  Filled 2021-02-16 (×20): qty 2

## 2021-02-16 MED ORDER — LACTATED RINGERS IV SOLN
500.0000 mL | Freq: Once | INTRAVENOUS | Status: AC | PRN
  Administered 2021-02-17: 500 mL via INTRAVENOUS

## 2021-02-16 MED ORDER — PROTAMINE SULFATE 10 MG/ML IV SOLN
INTRAVENOUS | Status: AC
  Filled 2021-02-16: qty 25

## 2021-02-16 MED ORDER — ACETAMINOPHEN 160 MG/5ML PO SOLN: 1000.0000 mg | Freq: Four times a day (QID) | ORAL | Status: AC

## 2021-02-16 MED ORDER — LACTATED RINGERS IV SOLN: INTRAVENOUS | Status: DC

## 2021-02-16 MED ORDER — PHENYLEPHRINE 40 MCG/ML (10ML) SYRINGE FOR IV PUSH (FOR BLOOD PRESSURE SUPPORT)
PREFILLED_SYRINGE | INTRAVENOUS | Status: AC
  Filled 2021-02-16: qty 10

## 2021-02-16 MED ORDER — ONDANSETRON HCL 4 MG/2ML IJ SOLN
4.0000 mg | Freq: Four times a day (QID) | INTRAMUSCULAR | Status: DC | PRN
  Administered 2021-02-17 – 2021-02-20 (×4): 4 mg via INTRAVENOUS
  Filled 2021-02-16 (×4): qty 2

## 2021-02-16 MED ORDER — FENTANYL CITRATE (PF) 250 MCG/5ML IJ SOLN
INTRAMUSCULAR | Status: DC | PRN
  Administered 2021-02-16 (×2): 50 ug via INTRAVENOUS
  Administered 2021-02-16: 450 ug via INTRAVENOUS
  Administered 2021-02-16: 200 ug via INTRAVENOUS

## 2021-02-16 MED ORDER — CHLORHEXIDINE GLUCONATE CLOTH 2 % EX PADS
6.0000 | MEDICATED_PAD | Freq: Every day | CUTANEOUS | Status: DC
  Administered 2021-02-16 – 2021-02-18 (×3): 6 via TOPICAL

## 2021-02-16 MED ORDER — MIDAZOLAM HCL (PF) 10 MG/2ML IJ SOLN
INTRAMUSCULAR | Status: AC
  Filled 2021-02-16: qty 2

## 2021-02-16 MED ORDER — ROCURONIUM BROMIDE 10 MG/ML (PF) SYRINGE
PREFILLED_SYRINGE | INTRAVENOUS | Status: DC | PRN
  Administered 2021-02-16: 100 mg via INTRAVENOUS
  Administered 2021-02-16: 50 mg via INTRAVENOUS

## 2021-02-16 MED ORDER — MORPHINE SULFATE (PF) 2 MG/ML IV SOLN
1.0000 mg | INTRAVENOUS | Status: DC | PRN
  Administered 2021-02-16 (×3): 4 mg via INTRAVENOUS
  Administered 2021-02-17 – 2021-02-18 (×2): 2 mg via INTRAVENOUS
  Filled 2021-02-16 (×3): qty 2
  Filled 2021-02-16 (×2): qty 1

## 2021-02-16 MED ORDER — METOPROLOL TARTRATE 12.5 MG HALF TABLET
12.5000 mg | ORAL_TABLET | Freq: Two times a day (BID) | ORAL | Status: DC
Start: 2021-02-16 — End: 2021-02-20
  Administered 2021-02-16 – 2021-02-19 (×7): 12.5 mg via ORAL
  Filled 2021-02-16 (×7): qty 1

## 2021-02-16 MED ORDER — ASPIRIN 81 MG PO CHEW
324.0000 mg | CHEWABLE_TABLET | Freq: Every day | ORAL | Status: DC
  Administered 2021-02-17 – 2021-02-22 (×3): 324 mg
  Filled 2021-02-16 (×3): qty 4

## 2021-02-16 MED ORDER — ALBUMIN HUMAN 5 % IV SOLN: INTRAVENOUS | Status: DC | PRN

## 2021-02-16 MED ORDER — PROPOFOL 10 MG/ML IV BOLUS: INTRAVENOUS | Status: DC | PRN

## 2021-02-16 MED ORDER — ATORVASTATIN CALCIUM 80 MG PO TABS
80.0000 mg | ORAL_TABLET | Freq: Every day | ORAL | Status: DC
  Administered 2021-02-17 – 2021-02-23 (×7): 80 mg via ORAL
  Filled 2021-02-16 (×7): qty 1

## 2021-02-16 MED ORDER — LACTATED RINGERS IV SOLN: INTRAVENOUS | Status: DC | PRN

## 2021-02-16 MED ORDER — PHENYLEPHRINE 40 MCG/ML (10ML) SYRINGE FOR IV PUSH (FOR BLOOD PRESSURE SUPPORT)
PREFILLED_SYRINGE | INTRAVENOUS | Status: DC | PRN
  Administered 2021-02-16: 80 ug via INTRAVENOUS
  Administered 2021-02-16 (×2): 40 ug via INTRAVENOUS
  Administered 2021-02-16: 120 ug via INTRAVENOUS

## 2021-02-16 MED ORDER — METOPROLOL TARTRATE 5 MG/5ML IV SOLN: 2.5000 mg | INTRAVENOUS | Status: DC | PRN

## 2021-02-16 MED ORDER — MAGNESIUM SULFATE 4 GM/100ML IV SOLN
4.0000 g | Freq: Once | INTRAVENOUS | Status: AC
  Administered 2021-02-16: 4 g via INTRAVENOUS
  Filled 2021-02-16: qty 100

## 2021-02-16 MED ORDER — DEXMEDETOMIDINE HCL IN NACL 400 MCG/100ML IV SOLN: 0.0000 ug/kg/h | INTRAVENOUS | Status: DC

## 2021-02-16 MED ORDER — MIDAZOLAM HCL 2 MG/2ML IJ SOLN
2.0000 mg | INTRAMUSCULAR | Status: DC | PRN
  Administered 2021-02-16: 1 mg via INTRAVENOUS
  Filled 2021-02-16: qty 2

## 2021-02-16 MED ORDER — NOREPINEPHRINE 4 MG/250ML-% IV SOLN: 0.0000 ug/min | INTRAVENOUS | Status: DC

## 2021-02-16 MED ORDER — CHLORHEXIDINE GLUCONATE 0.12 % MT SOLN
15.0000 mL | OROMUCOSAL | Status: AC
  Administered 2021-02-16: 15 mL via OROMUCOSAL

## 2021-02-16 MED ORDER — ROCURONIUM BROMIDE 10 MG/ML (PF) SYRINGE
PREFILLED_SYRINGE | INTRAVENOUS | Status: AC
  Filled 2021-02-16: qty 10

## 2021-02-16 MED ORDER — PROPOFOL 10 MG/ML IV BOLUS
INTRAVENOUS | Status: AC
  Filled 2021-02-16: qty 20

## 2021-02-16 MED ORDER — CEFAZOLIN SODIUM-DEXTROSE 2-4 GM/100ML-% IV SOLN
2.0000 g | Freq: Three times a day (TID) | INTRAVENOUS | Status: AC
Start: 2021-02-16 — End: 2021-02-18
  Administered 2021-02-16 – 2021-02-18 (×5): 2 g via INTRAVENOUS
  Filled 2021-02-16 (×8): qty 100

## 2021-02-16 MED ORDER — PROTAMINE SULFATE 10 MG/ML IV SOLN
INTRAVENOUS | Status: AC
  Filled 2021-02-16: qty 5

## 2021-02-16 MED ORDER — POTASSIUM CHLORIDE 10 MEQ/50ML IV SOLN
10.0000 meq | INTRAVENOUS | Status: AC
  Administered 2021-02-16 (×2): 10 meq via INTRAVENOUS

## 2021-02-16 MED ORDER — NICARDIPINE HCL IN NACL 20-0.86 MG/200ML-% IV SOLN
5.0000 mg/h | INTRAVENOUS | Status: DC
  Filled 2021-02-16: qty 200

## 2021-02-16 MED ORDER — BISACODYL 5 MG PO TBEC
10.0000 mg | DELAYED_RELEASE_TABLET | Freq: Every day | ORAL | Status: DC
  Administered 2021-02-17 – 2021-02-20 (×3): 10 mg via ORAL
  Filled 2021-02-16 (×4): qty 2

## 2021-02-16 MED ORDER — PANTOPRAZOLE SODIUM 40 MG PO TBEC
40.0000 mg | DELAYED_RELEASE_TABLET | Freq: Every day | ORAL | Status: DC
  Administered 2021-02-18 – 2021-02-23 (×6): 40 mg via ORAL
  Filled 2021-02-16 (×6): qty 1

## 2021-02-16 MED ORDER — SODIUM CHLORIDE (PF) 0.9 % IJ SOLN
INTRAMUSCULAR | Status: AC
  Filled 2021-02-16: qty 10

## 2021-02-16 MED ORDER — ROCURONIUM BROMIDE 10 MG/ML (PF) SYRINGE
PREFILLED_SYRINGE | INTRAVENOUS | Status: AC
  Filled 2021-02-16: qty 20

## 2021-02-16 MED ORDER — ALBUMIN HUMAN 5 % IV SOLN
250.0000 mL | INTRAVENOUS | Status: AC | PRN
  Administered 2021-02-16 (×3): 12.5 g via INTRAVENOUS
  Filled 2021-02-16 (×2): qty 250

## 2021-02-16 MED ORDER — FENTANYL CITRATE (PF) 250 MCG/5ML IJ SOLN
INTRAMUSCULAR | Status: AC
  Filled 2021-02-16: qty 25

## 2021-02-16 MED ORDER — SODIUM BICARBONATE 8.4 % IV SOLN
50.0000 meq | Freq: Once | INTRAVENOUS | Status: AC
  Administered 2021-02-16: 50 meq via INTRAVENOUS

## 2021-02-16 MED ORDER — SUCCINYLCHOLINE CHLORIDE 200 MG/10ML IV SOSY
PREFILLED_SYRINGE | INTRAVENOUS | Status: AC
  Filled 2021-02-16: qty 10

## 2021-02-16 MED ORDER — SODIUM CHLORIDE 0.9 % IV SOLN: INTRAVENOUS | Status: DC

## 2021-02-16 MED ORDER — ASPIRIN EC 325 MG PO TBEC
325.0000 mg | DELAYED_RELEASE_TABLET | Freq: Every day | ORAL | Status: DC
  Administered 2021-02-18 – 2021-02-23 (×4): 325 mg via ORAL
  Filled 2021-02-16 (×6): qty 1

## 2021-02-16 MED ORDER — SODIUM CHLORIDE 0.45 % IV SOLN: INTRAVENOUS | Status: DC | PRN

## 2021-02-16 MED ORDER — HEPARIN SODIUM (PORCINE) 1000 UNIT/ML IJ SOLN
INTRAMUSCULAR | Status: DC | PRN
Start: 2021-02-16 — End: 2021-02-16
  Administered 2021-02-16: 29000 [IU] via INTRAVENOUS

## 2021-02-16 MED ORDER — METOPROLOL TARTRATE 25 MG/10 ML ORAL SUSPENSION
12.5000 mg | Freq: Two times a day (BID) | ORAL | Status: DC
  Filled 2021-02-16: qty 5

## 2021-02-16 MED ORDER — MIDAZOLAM HCL (PF) 5 MG/ML IJ SOLN
INTRAMUSCULAR | Status: DC | PRN
  Administered 2021-02-16 (×2): 1 mg via INTRAVENOUS
  Administered 2021-02-16: 3 mg via INTRAVENOUS

## 2021-02-16 MED ORDER — LIDOCAINE 2% (20 MG/ML) 5 ML SYRINGE
INTRAMUSCULAR | Status: AC
  Filled 2021-02-16: qty 5

## 2021-02-16 MED ORDER — BISACODYL 10 MG RE SUPP
10.0000 mg | Freq: Every day | RECTAL | Status: DC
  Filled 2021-02-16 (×2): qty 1

## 2021-02-16 MED ORDER — INSULIN REGULAR(HUMAN) IN NACL 100-0.9 UT/100ML-% IV SOLN: INTRAVENOUS | Status: DC

## 2021-02-16 MED ORDER — TRAMADOL HCL 50 MG PO TABS
50.0000 mg | ORAL_TABLET | ORAL | Status: DC | PRN
  Administered 2021-02-17 – 2021-02-18 (×5): 100 mg via ORAL
  Administered 2021-02-19: 50 mg via ORAL
  Administered 2021-02-19 – 2021-02-23 (×8): 100 mg via ORAL
  Filled 2021-02-16 (×9): qty 2
  Filled 2021-02-16: qty 1
  Filled 2021-02-16 (×4): qty 2

## 2021-02-16 MED ORDER — PROPOFOL 10 MG/ML IV BOLUS
INTRAVENOUS | Status: DC | PRN
  Administered 2021-02-16: 50 mg via INTRAVENOUS

## 2021-02-16 MED ORDER — DOCUSATE SODIUM 100 MG PO CAPS
200.0000 mg | ORAL_CAPSULE | Freq: Every day | ORAL | Status: DC
  Administered 2021-02-17 – 2021-02-20 (×4): 200 mg via ORAL
  Filled 2021-02-16 (×5): qty 2

## 2021-02-16 MED ORDER — SODIUM CHLORIDE 0.9% FLUSH: 3.0000 mL | INTRAVENOUS | Status: DC | PRN

## 2021-02-16 MED ORDER — PLASMA-LYTE A IV SOLN
INTRAVENOUS | Status: DC | PRN
  Administered 2021-02-16: 1000 mL via INTRAVASCULAR

## 2021-02-16 MED ORDER — OXYCODONE HCL 5 MG PO TABS
5.0000 mg | ORAL_TABLET | ORAL | Status: DC | PRN
  Administered 2021-02-16 – 2021-02-17 (×5): 10 mg via ORAL
  Filled 2021-02-16 (×5): qty 2

## 2021-02-16 MED ORDER — FAMOTIDINE IN NACL 20-0.9 MG/50ML-% IV SOLN
20.0000 mg | Freq: Two times a day (BID) | INTRAVENOUS | Status: AC
  Filled 2021-02-16: qty 50

## 2021-02-16 MED ORDER — SODIUM CHLORIDE 0.9% FLUSH
3.0000 mL | Freq: Two times a day (BID) | INTRAVENOUS | Status: DC
  Administered 2021-02-17 – 2021-02-23 (×7): 3 mL via INTRAVENOUS

## 2021-02-16 MED ORDER — DEXTROSE 50 % IV SOLN
0.0000 mL | INTRAVENOUS | Status: DC | PRN
  Administered 2021-02-17: 25 mL via INTRAVENOUS
  Filled 2021-02-16 (×2): qty 50

## 2021-02-16 MED ORDER — PHENYLEPHRINE HCL-NACL 20-0.9 MG/250ML-% IV SOLN: 0.0000 ug/min | INTRAVENOUS | Status: DC

## 2021-02-16 SURGICAL SUPPLY — 85 items
BAG COUNTER SPONGE SURGICOUNT (BAG) ×3 IMPLANT
BAG DECANTER FOR FLEXI CONT (MISCELLANEOUS) ×5 IMPLANT
BAG SPNG CNTER NS LX DISP (BAG) ×12
BLADE CLIPPER SURG (BLADE) ×5 IMPLANT
BLADE STERNUM SYSTEM 6 (BLADE) ×5 IMPLANT
BNDG ELASTIC 4X5.8 VLCR STR LF (GAUZE/BANDAGES/DRESSINGS) ×5 IMPLANT
BNDG ELASTIC 6X5.8 VLCR STR LF (GAUZE/BANDAGES/DRESSINGS) ×5 IMPLANT
BNDG GAUZE ELAST 4 BULKY (GAUZE/BANDAGES/DRESSINGS) ×5 IMPLANT
CABLE SURGICAL S-101-97-12 (CABLE) ×5 IMPLANT
CANISTER SUCT 3000ML PPV (MISCELLANEOUS) ×5 IMPLANT
CANNULA MC2 2 STG 29/37 NON-V (CANNULA) ×4 IMPLANT
CANNULA MC2 TWO STAGE (CANNULA) ×5
CANNULA NON VENT 20FR 12 (CANNULA) ×5 IMPLANT
CATH ROBINSON RED A/P 18FR (CATHETERS) ×10 IMPLANT
CLIP RETRACTION 3.0MM CORONARY (MISCELLANEOUS) ×4 IMPLANT
CLIP VESOCCLUDE MED 24/CT (CLIP) IMPLANT
CLIP VESOCCLUDE SM WIDE 24/CT (CLIP) IMPLANT
CONN ST 1/2X1/2  BEN (MISCELLANEOUS) ×5
CONN ST 1/2X1/2 BEN (MISCELLANEOUS) ×4 IMPLANT
CONNECTOR BLAKE 2:1 CARIO BLK (MISCELLANEOUS) ×5 IMPLANT
CONTAINER PROTECT SURGISLUSH (MISCELLANEOUS) ×5 IMPLANT
DRAIN CHANNEL 19F RND (DRAIN) ×14 IMPLANT
DRAIN CONNECTOR BLAKE 1:1 (MISCELLANEOUS) ×5 IMPLANT
DRAPE CARDIOVASCULAR INCISE (DRAPES) ×5
DRAPE INCISE IOBAN 66X45 STRL (DRAPES) IMPLANT
DRAPE SRG 135X102X78XABS (DRAPES) ×4 IMPLANT
DRAPE WARM FLUID 44X44 (DRAPES) ×5 IMPLANT
DRSG AQUACEL AG ADV 3.5X10 (GAUZE/BANDAGES/DRESSINGS) ×5 IMPLANT
DRSG COVADERM 4X14 (GAUZE/BANDAGES/DRESSINGS) ×5 IMPLANT
ELECT BLADE 4.0 EZ CLEAN MEGAD (MISCELLANEOUS) ×5
ELECT REM PT RETURN 9FT ADLT (ELECTROSURGICAL) ×10
ELECTRODE BLDE 4.0 EZ CLN MEGD (MISCELLANEOUS) ×4 IMPLANT
ELECTRODE REM PT RTRN 9FT ADLT (ELECTROSURGICAL) ×8 IMPLANT
FELT TEFLON 1X6 (MISCELLANEOUS) ×9 IMPLANT
GAUZE SPONGE 4X4 12PLY STRL (GAUZE/BANDAGES/DRESSINGS) ×10 IMPLANT
GAUZE SPONGE 4X4 12PLY STRL LF (GAUZE/BANDAGES/DRESSINGS) ×2 IMPLANT
GLOVE SURG ENC MOIS LTX SZ7 (GLOVE) ×10 IMPLANT
GLOVE SURG ENC TEXT LTX SZ7.5 (GLOVE) ×10 IMPLANT
GLOVE SURG MICRO LTX SZ7 (GLOVE) ×2 IMPLANT
GLOVE SURG SYN 7.5  E (GLOVE) ×5
GLOVE SURG SYN 7.5 E (GLOVE) ×4 IMPLANT
GLOVE SURG SYN 7.5 PF PI (GLOVE) IMPLANT
GOWN STRL REUS W/ TWL LRG LVL3 (GOWN DISPOSABLE) ×16 IMPLANT
GOWN STRL REUS W/ TWL XL LVL3 (GOWN DISPOSABLE) ×8 IMPLANT
GOWN STRL REUS W/TWL LRG LVL3 (GOWN DISPOSABLE) ×30
GOWN STRL REUS W/TWL XL LVL3 (GOWN DISPOSABLE) ×10
HEMOSTAT POWDER SURGIFOAM 1G (HEMOSTASIS) ×15 IMPLANT
INSERT FOGARTY XLG (MISCELLANEOUS) ×1 IMPLANT
INSERT SUTURE HOLDER (MISCELLANEOUS) ×5 IMPLANT
KIT BASIN OR (CUSTOM PROCEDURE TRAY) ×5 IMPLANT
KIT DRAINAGE VACCUM ASSIST (KITS) ×1 IMPLANT
KIT SUCTION CATH 14FR (SUCTIONS) ×5 IMPLANT
KIT TURNOVER KIT B (KITS) ×5 IMPLANT
KIT VASOVIEW HEMOPRO 2 VH 4000 (KITS) ×5 IMPLANT
LEAD PACING MYOCARDI (MISCELLANEOUS) ×4 IMPLANT
MARKER GRAFT CORONARY BYPASS (MISCELLANEOUS) ×15 IMPLANT
NS IRRIG 1000ML POUR BTL (IV SOLUTION) ×25 IMPLANT
PACK ACCESSORY CANNULA KIT (KITS) ×5 IMPLANT
PACK E OPEN HEART (SUTURE) ×5 IMPLANT
PACK OPEN HEART (CUSTOM PROCEDURE TRAY) ×5 IMPLANT
PAD ARMBOARD 7.5X6 YLW CONV (MISCELLANEOUS) ×10 IMPLANT
PAD ELECT DEFIB RADIOL ZOLL (MISCELLANEOUS) ×5 IMPLANT
PENCIL BUTTON HOLSTER BLD 10FT (ELECTRODE) ×5 IMPLANT
POSITIONER HEAD DONUT 9IN (MISCELLANEOUS) ×5 IMPLANT
PUNCH AORTIC ROTATE 4.0MM (MISCELLANEOUS) ×5 IMPLANT
SET MPS 3-ND DEL (MISCELLANEOUS) ×1 IMPLANT
SUPPORT HEART JANKE-BARRON (MISCELLANEOUS) ×5 IMPLANT
SUT BONE WAX W31G (SUTURE) ×5 IMPLANT
SUT ETHIBOND X763 2 0 SH 1 (SUTURE) ×10 IMPLANT
SUT MNCRL AB 3-0 PS2 18 (SUTURE) ×10 IMPLANT
SUT PDS AB 1 CTX 36 (SUTURE) ×10 IMPLANT
SUT PROLENE 4 0 SH DA (SUTURE) ×6 IMPLANT
SUT PROLENE 5 0 C 1 36 (SUTURE) ×15 IMPLANT
SUT PROLENE 7 0 BV 1 (SUTURE) ×1 IMPLANT
SUT PROLENE 7 0 BV1 MDA (SUTURE) ×5 IMPLANT
SUT STEEL 6MS V (SUTURE) ×8 IMPLANT
SUT STEEL STERNAL CCS#1 18IN (SUTURE) ×3 IMPLANT
SYSTEM SAHARA CHEST DRAIN ATS (WOUND CARE) ×5 IMPLANT
TAPE CLOTH SURG 4X10 WHT LF (GAUZE/BANDAGES/DRESSINGS) ×1 IMPLANT
TOWEL GREEN STERILE (TOWEL DISPOSABLE) ×5 IMPLANT
TOWEL GREEN STERILE FF (TOWEL DISPOSABLE) ×5 IMPLANT
TRAY FOLEY SLVR 16FR TEMP STAT (SET/KITS/TRAYS/PACK) ×5 IMPLANT
TUBING LAP HI FLOW INSUFFLATIO (TUBING) ×5 IMPLANT
UNDERPAD 30X36 HEAVY ABSORB (UNDERPADS AND DIAPERS) ×5 IMPLANT
WATER STERILE IRR 1000ML POUR (IV SOLUTION) ×10 IMPLANT

## 2021-02-16 NOTE — Anesthesia Procedure Notes (Signed)
Arterial Line Insertion Start/End8/15/2022 7:50 AM, 02/16/2021 7:00 AM Performed by: CRNA  Patient location: Pre-op. Preanesthetic checklist: patient identified, IV checked, site marked, risks and benefits discussed, surgical consent, monitors and equipment checked, pre-op evaluation, timeout performed and anesthesia consent Lidocaine 1% used for infiltration and patient sedated radial was placed Catheter size: 20 G Hand hygiene performed  and maximum sterile barriers used   Attempts: 1 Procedure performed without using ultrasound guided technique. Following insertion, Biopatch and dressing applied. Post procedure assessment: normal  Patient tolerated the procedure well with no immediate complications. Additional procedure comments: Placed by Hessie Dibble CRNA.

## 2021-02-16 NOTE — Anesthesia Procedure Notes (Signed)
Procedure Name: Intubation Date/Time: 02/16/2021 7:55 AM Performed by: Lowella Dell, CRNA Pre-anesthesia Checklist: Patient identified, Emergency Drugs available, Suction available and Patient being monitored Patient Re-evaluated:Patient Re-evaluated prior to induction Oxygen Delivery Method: Circle System Utilized Preoxygenation: Pre-oxygenation with 100% oxygen Induction Type: IV induction Ventilation: Mask ventilation without difficulty and Oral airway inserted - appropriate to patient size Laryngoscope Size: Mac and 4 Grade View: Grade I Tube type: Oral Tube size: 8.0 mm Number of attempts: 1 Airway Equipment and Method: Stylet Placement Confirmation: ETT inserted through vocal cords under direct vision, positive ETCO2 and breath sounds checked- equal and bilateral Secured at: 22 cm Tube secured with: Tape Dental Injury: Teeth and Oropharynx as per pre-operative assessment

## 2021-02-16 NOTE — Anesthesia Procedure Notes (Signed)
Central Venous Catheter Insertion Performed by: Roderic Palau, MD, anesthesiologist Start/End8/15/2022 7:00 AM, 02/16/2021 7:15 AM Patient location: Pre-op. Preanesthetic checklist: patient identified, IV checked, site marked, risks and benefits discussed, surgical consent, monitors and equipment checked, pre-op evaluation, timeout performed and anesthesia consent Position: Trendelenburg Lidocaine 1% used for infiltration and patient sedated Hand hygiene performed , maximum sterile barriers used  and Seldinger technique used Catheter size: 8.5 Fr Total catheter length 10. Central line was placed.Sheath introducer Procedure performed using ultrasound guided technique. Ultrasound Notes:anatomy identified, needle tip was noted to be adjacent to the nerve/plexus identified, no ultrasound evidence of intravascular and/or intraneural injection and image(s) printed for medical record Attempts: 1 Following insertion, line sutured, dressing applied and Biopatch. Post procedure assessment: blood return through all ports, free fluid flow and no air  Patient tolerated the procedure well with no immediate complications.

## 2021-02-16 NOTE — Brief Op Note (Signed)
02/13/2021 - 02/16/2021  10:41 AM  PATIENT:  Jesus Clayman Sr.  65 y.o. male  PRE-OPERATIVE DIAGNOSIS:  CAD, CRITICAL LEFT MAIN CORONARY STENOSIS  POST-OPERATIVE DIAGNOSIS:  CAD, CRITICAL LEFT MAIN CORONARY STENOSIS LMD  PROCEDURE:   CORONARY ARTERY BYPASS GRAFTING (CABG)x 2 ON CARDIOPULMONARY BYPASS USING RIGHT GSV AND LIMA.   LIMA-> LAD SVG->OM  TRANSESOPHAGEAL ECHOCARDIOGRAM  ENDOVEIN HARVEST OF GREATER SAPHENOUS VEIN  APPLICATION OF CELL SAVER  SURGEON: Lightfoot, Lucile Crater, MD - Primary  PHYSICIAN ASSISTANT: Gae Bihl  ASSISTANTS: Pelance, Bubba Hales, RN, RN First Assistant   ANESTHESIA:   general  EBL:  Per anesthesia and perfusion records   BLOOD ADMINISTERED:none  DRAINS:  Mediastinal and left pleural drains    LOCAL MEDICATIONS USED:  NONE  SPECIMEN:  No Specimen  DISPOSITION OF SPECIMEN:  N/A  COUNTS:  YES  DICTATION: .Dragon Dictation  PLAN OF CARE: Admit to inpatient   PATIENT DISPOSITION:  ICU - intubated and hemodynamically stable.   Delay start of Pharmacological VTE agent (>24hrs) due to surgical blood loss or risk of bleeding: yes

## 2021-02-16 NOTE — Progress Notes (Signed)
     [address]       [place] [address]             [phone]       No events Vitals:   02/15/21 2022 02/16/21 0537  BP: 128/83 133/80  Pulse: 66 63  Resp: 20 19  Temp: 98 F (36.7 C) 97.9 F (36.6 C)  SpO2: 94% 95%   Alert NAD Sinus EWOB  OR today for CABG  Demetrick Eichenberger O Nikkol Pai

## 2021-02-16 NOTE — Progress Notes (Signed)
  Echocardiogram Echocardiogram Transesophageal has been performed.  Jesus Davenport 02/16/2021, 8:58 AM

## 2021-02-16 NOTE — Op Note (Signed)
[address]       [place] [address]             [phone]                                          02/16/2021 Patient:  [name] 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  LIMA LAD, RSVG OM1   Endoscopic greater saphenous vein harvest on the right   Surgeon and Role:      * Keishawn Rajewski, Lucile Crater, MD - Primary    * M. Roddenberry - assisting  Anesthesia  general EBL:  529m Blood Administration: none Xclamp Time:  28 min Pump Time:  676m  Drains: 1968 blake drain: L, mediastinal  Wires: none Counts: correct   Indications:  [age]ear old male admitted following elective left heart cath which shows severe left main disease.  His echocardiogram shows preserved biventricular function and no significant valvular disease.  He is tentatively scheduled for 02/16/2021 for two-vessel CABG. Findings: Good Vein, good LIMA.  Soft LAD target.  Intramyocardial OM target.  Ascending aortic aneuryms, with mild-moderate AI on TEE.  Root measured 3.7cm.    Operative Technique: All invasive lines were placed in pre-op holding.  After the risks, benefits and alternatives were thoroughly discussed, the patient was brought to the operative theatre.  Anesthesia was induced, and the patient was prepped and draped in normal sterile fashion.  An appropriate surgical pause was performed, and pre-operative antibiotics were dosed accordingly.  We began with simultaneous incisions along the right leg for harvesting of the greater saphenous vein and the chest for the sternotomy.  In regards to the sternotomy, this was carried down with bovie cautery, and the sternum was divided with a reciprocating saw.  Meticulous hemostasis was obtained.  The left internal thoracic artery was exposed and harvested in in pedicled fashion.  The patient was systemically heparinized, and the artery was divided distally, and placed in a  papaverine sponge.    The sternal elevator was removed, and a retractor was placed.  The pericardium was divided in the midline and fashioned into a cradle with pericardial stitches.   After we confirmed an appropriate ACT, the ascending aorta was cannulated in standard fashion.  The right atrial appendage was used for venous cannulation site.  Cardiopulmonary bypass was initiated, and the heart retractor was placed. The cross clamp was applied, and a dose of anterograde cardioplegia was given with good arrest of the heart.  Next we exposed the lateral wall, and found a good target on the OM1.  An end to side anastomosis with the vein graft was then created. Finally, we exposed a good target on the  LAD, and fashioned an end to side anastomosis between it and the LITA.  We began to re-warm, and a re-animation dose of cardioplegia was given.  The heart was de-aired, and the cross clamp was removed.  Meticulous hemostasis was obtained.    A partial occludding clamp was then placed on the ascending aorta, and we created an end to side anastomosis between it and the proximal vein graft.  The proximal site was marked with a ring.  Hemostasis was obtained, and we separated from cardiopulmonary bypass without event.  The heparin was reversed with protamine.  Chest tubes and wires were placed, and the sternum was re-approximated with sternal wires.  The soft tissue and skin were re-approximated wth absorbable suture.    The patient tolerated the procedure without any immediate complications, and was transferred to the ICU in guarded condition.  Nakiya Rallis Bary Leriche

## 2021-02-16 NOTE — Plan of Care (Signed)
  Problem: Cardiac: Goal: Will achieve and/or maintain hemodynamic stability Outcome: Progressing   Problem: Clinical Measurements: Goal: Postoperative complications will be avoided or minimized Outcome: Progressing   Problem: Respiratory: Goal: Respiratory status will improve Outcome: Progressing   Problem: Skin Integrity: Goal: Wound healing without signs and symptoms of infection Outcome: Progressing Goal: Risk for impaired skin integrity will decrease Outcome: Progressing   Problem: Urinary Elimination: Goal: Ability to achieve and maintain adequate renal perfusion and functioning will improve Outcome: Progressing

## 2021-02-16 NOTE — Anesthesia Postprocedure Evaluation (Signed)
Anesthesia Post Note  Patient: Jesus NETHERCOTT Sr.  Procedure(s) Performed: CORONARY ARTERY BYPASS GRAFTING (CABG)x 2 ON CARDIOPULMONARY BYPASS USING RIGHT GSV AND LIMA. (Chest) TRANSESOPHAGEAL ECHOCARDIOGRAM (TEE) ENDOVEIN HARVEST OF GREATER SAPHENOUS VEIN (Right) APPLICATION OF CELL SAVER     Patient location during evaluation: SICU Anesthesia Type: General Level of consciousness: sedated Pain management: pain level controlled Vital Signs Assessment: post-procedure vital signs reviewed and stable Respiratory status: patient remains intubated per anesthesia plan Cardiovascular status: stable Postop Assessment: no apparent nausea or vomiting Anesthetic complications: no   No notable events documented.  Last Vitals:  Vitals:   02/16/21 1300 02/16/21 1400  BP:    Pulse: 76 84  Resp: 12 16  Temp: (!) 35.8 C (!) 35.4 C  SpO2: 94% 95%    Last Pain:  Vitals:   02/16/21 1300  TempSrc:   PainSc: 0-No pain                 Charlyn Vialpando,W. EDMOND

## 2021-02-16 NOTE — Transfer of Care (Signed)
Immediate Anesthesia Transfer of Care Note  Patient: Jesus LIPSITZ Sr.  Procedure(s) Performed: CORONARY ARTERY BYPASS GRAFTING (CABG)x 2 ON CARDIOPULMONARY BYPASS USING RIGHT GSV AND LIMA. (Chest) TRANSESOPHAGEAL ECHOCARDIOGRAM (TEE) ENDOVEIN HARVEST OF GREATER SAPHENOUS VEIN (Right) APPLICATION OF CELL SAVER  Patient Location: SICU  Anesthesia Type:General  Level of Consciousness: sedated and Patient remains intubated per anesthesia plan  Airway & Oxygen Therapy: Patient remains intubated per anesthesia plan and Patient placed on Ventilator (see vital sign flow sheet for setting)  Post-op Assessment: Report given to RN and Post -op Vital signs reviewed and stable  Post vital signs: Reviewed and stable  Last Vitals:  Vitals Value Taken Time  BP 111/64   Temp    Pulse 73 02/16/21 1211  Resp 12 02/16/21 1211  SpO2 100 % 02/16/21 1211  Vitals shown include unvalidated device data.  Last Pain:  Vitals:   02/16/21 0537  TempSrc: Oral  PainSc:       Patients Stated Pain Goal: 0 ([id number])  Complications: No notable events documented.

## 2021-02-16 NOTE — Procedures (Signed)
Extubation Procedure Note  Patient Details:   Name: Jesus PALEY Sr. DOB: 1956/03/28 MRN: [id number]   Airway Documentation:    Vent end date: 02/16/21 Vent end time: 1530   Evaluation  O2 sats: stable throughout Complications: No apparent complications Patient did tolerate procedure well. Bilateral Breath Sounds: Clear   Yes  Pt extubated per rapid wean protocol. NIF -20 and VC 753m. Pt with positive cuff leak prior. Upon extubation pt placed on 6L nasal cannula. Pt able to speak name, give a good cough and no stridor heard at this time. Incentive spirometry initiated with pt at this time. 5081machieved with good effort.   MoSharla Kidney/15/2022, 3:36 PM

## 2021-02-17 ENCOUNTER — Encounter (HOSPITAL_COMMUNITY): Payer: Self-pay | Admitting: Thoracic Surgery (Cardiothoracic Vascular Surgery)

## 2021-02-17 ENCOUNTER — Inpatient Hospital Stay (HOSPITAL_COMMUNITY): Payer: Medicare Other

## 2021-02-17 LAB — CBC
HCT: 37.5 % — ABNORMAL LOW (ref 39.0–52.0)
HCT: 37.3 % — ABNORMAL LOW (ref 39.0–52.0)
Hemoglobin: 12.8 g/dL — ABNORMAL LOW (ref 13.0–17.0)
Hemoglobin: 12.2 g/dL — ABNORMAL LOW (ref 13.0–17.0)
MCH: 30.9 pg (ref 26.0–34.0)
MCH: 30.3 pg (ref 26.0–34.0)
MCHC: 34.1 g/dL (ref 30.0–36.0)
MCHC: 32.7 g/dL (ref 30.0–36.0)
MCV: 90.6 fL (ref 80.0–100.0)
MCV: 92.6 fL (ref 80.0–100.0)
Platelets: 143 10*3/uL — ABNORMAL LOW (ref 150–400)
Platelets: 139 10*3/uL — ABNORMAL LOW (ref 150–400)
RBC: 4.14 MIL/uL — ABNORMAL LOW (ref 4.22–5.81)
RBC: 4.03 MIL/uL — ABNORMAL LOW (ref 4.22–5.81)
RDW: 14.3 % (ref 11.5–15.5)
RDW: 14.3 % (ref 11.5–15.5)
WBC: 15.1 10*3/uL — ABNORMAL HIGH (ref 4.0–10.5)
WBC: 14.8 10*3/uL — ABNORMAL HIGH (ref 4.0–10.5)
nRBC: 0 % (ref 0.0–0.2)
nRBC: 0 % (ref 0.0–0.2)

## 2021-02-17 LAB — GLUCOSE, CAPILLARY
Glucose-Capillary: 109 mg/dL — ABNORMAL HIGH (ref 70–99)
Glucose-Capillary: 115 mg/dL — ABNORMAL HIGH (ref 70–99)
Glucose-Capillary: 129 mg/dL — ABNORMAL HIGH (ref 70–99)
Glucose-Capillary: 144 mg/dL — ABNORMAL HIGH (ref 70–99)
Glucose-Capillary: 157 mg/dL — ABNORMAL HIGH (ref 70–99)
Glucose-Capillary: 67 mg/dL — ABNORMAL LOW (ref 70–99)
Glucose-Capillary: 73 mg/dL (ref 70–99)
Glucose-Capillary: 77 mg/dL (ref 70–99)
Glucose-Capillary: 78 mg/dL (ref 70–99)
Glucose-Capillary: 80 mg/dL (ref 70–99)
Glucose-Capillary: 87 mg/dL (ref 70–99)
Glucose-Capillary: 88 mg/dL (ref 70–99)
Glucose-Capillary: 88 mg/dL (ref 70–99)
Glucose-Capillary: 91 mg/dL (ref 70–99)
Glucose-Capillary: 92 mg/dL (ref 70–99)

## 2021-02-17 LAB — TYPE AND SCREEN
ABO/RH(D): O POS
Antibody Screen: NEGATIVE
Unit division: 0
Unit division: 0

## 2021-02-17 LAB — BASIC METABOLIC PANEL
Anion gap: 6 (ref 5–15)
Anion gap: 6 (ref 5–15)
BUN: 9 mg/dL (ref 8–23)
BUN: 13 mg/dL (ref 8–23)
CO2: 21 mmol/L — ABNORMAL LOW (ref 22–32)
CO2: 26 mmol/L (ref 22–32)
Calcium: 7.4 mg/dL — ABNORMAL LOW (ref 8.9–10.3)
Calcium: 8.4 mg/dL — ABNORMAL LOW (ref 8.9–10.3)
Chloride: 110 mmol/L (ref 98–111)
Chloride: 103 mmol/L (ref 98–111)
Creatinine, Ser: 0.68 mg/dL (ref 0.61–1.24)
Creatinine, Ser: 0.73 mg/dL (ref 0.61–1.24)
GFR, Estimated: >60 mL/min (ref >60)
GFR, Estimated: >60 mL/min (ref >60)
Glucose, Bld: 130 mg/dL — ABNORMAL HIGH (ref 70–99)
Glucose, Bld: 154 mg/dL — ABNORMAL HIGH (ref 70–99)
Potassium: 3.8 mmol/L (ref 3.5–5.1)
Potassium: 3.8 mmol/L (ref 3.5–5.1)
Sodium: 137 mmol/L (ref 135–145)
Sodium: 135 mmol/L (ref 135–145)

## 2021-02-17 LAB — BPAM RBC
Blood Product Expiration Date: 202209162359
Blood Product Expiration Date: 202209162359
Unit Type and Rh: 5100
Unit Type and Rh: 5100

## 2021-02-17 LAB — MAGNESIUM
Magnesium: 1.9 mg/dL (ref 1.7–2.4)
Magnesium: 2.2 mg/dL (ref 1.7–2.4)

## 2021-02-17 MED ORDER — SODIUM CHLORIDE 0.9 % IV SOLN
25.0000 mg | Freq: Four times a day (QID) | INTRAVENOUS | Status: DC | PRN
  Administered 2021-02-17 – 2021-02-18 (×2): 25 mg via INTRAVENOUS
  Filled 2021-02-17 (×4): qty 1

## 2021-02-17 MED ORDER — INSULIN ASPART 100 UNIT/ML IJ SOLN
0.0000 [IU] | INTRAMUSCULAR | Status: DC
  Administered 2021-02-17 – 2021-02-20 (×5): 2 [IU] via SUBCUTANEOUS

## 2021-02-17 MED ORDER — METOCLOPRAMIDE HCL 5 MG/ML IJ SOLN
10.0000 mg | Freq: Four times a day (QID) | INTRAMUSCULAR | Status: AC
  Administered 2021-02-17 – 2021-02-18 (×4): 10 mg via INTRAVENOUS
  Filled 2021-02-17 (×4): qty 2

## 2021-02-17 MED ORDER — POTASSIUM CHLORIDE 10 MEQ/50ML IV SOLN
10.0000 meq | INTRAVENOUS | Status: AC
  Administered 2021-02-17 (×3): 10 meq via INTRAVENOUS
  Filled 2021-02-17 (×3): qty 50

## 2021-02-17 MED ORDER — ENOXAPARIN SODIUM 40 MG/0.4ML IJ SOSY
40.0000 mg | PREFILLED_SYRINGE | Freq: Every day | INTRAMUSCULAR | Status: DC
  Administered 2021-02-17 – 2021-02-22 (×6): 40 mg via SUBCUTANEOUS
  Filled 2021-02-17 (×6): qty 0.4

## 2021-02-17 MED ORDER — INSULIN DETEMIR 100 UNIT/ML ~~LOC~~ SOLN
3.0000 [IU] | Freq: Once | SUBCUTANEOUS | Status: AC
  Administered 2021-02-17: 3 [IU] via SUBCUTANEOUS
  Filled 2021-02-17: qty 0.03

## 2021-02-17 NOTE — Progress Notes (Signed)
      [address]       [place] [address]             [phone]                 1 Day Post-Op Procedure(s) (LRB): CORONARY ARTERY BYPASS GRAFTING (CABG)x 2 ON CARDIOPULMONARY BYPASS USING RIGHT GSV AND LIMA. (N/A) TRANSESOPHAGEAL ECHOCARDIOGRAM (TEE) (N/A) ENDOVEIN HARVEST OF GREATER SAPHENOUS VEIN (Right) APPLICATION OF CELL SAVER   Events: No events.  Extubated overnight. Some nausea this afternoon. _______________________________________________________________ Vitals: BP 106/76   Pulse 85   Temp 99.3 F (37.4 C)   Resp (!) 21   Ht '5\' 8"'$  (1.727 m)   Wt 95.8 kg   SpO2 94%   BMI 32.13 kg/m   - Neuro: Alert NAD  - Cardiovascular: Sinus  Drips: None.      - Pulm: Easy work of breathing    ABG    Component Value Date/Time   PHART 7.292 (L) 02/16/2021 1632   PCO2ART 48.1 (H) 02/16/2021 1632   PO2ART 71 (L) 02/16/2021 1632   HCO3 23.3 02/16/2021 1632   TCO2 25 02/16/2021 1632   ACIDBASEDEF 4.0 (H) 02/16/2021 1632   O2SAT 92.0 02/16/2021 1632    - Abd: Soft - Extremity: Warm  .Intake/Output      08/15 0701 08/16 0700 08/16 0701 08/17 0700   I.V. (mL/kg) 2947 (30.8) 111.7 (1.2)   Blood 875    Other  0   IV Piggyback 2503.8 100   Total Intake(mL/kg) 6325.8 (66) 211.7 (2.2)   Urine (mL/kg/hr) 3690 (1.6) 255 (0.3)   Emesis/NG output  300   Other 2860    Blood 1141    Chest Tube 600 50   Total Output 8291 605   Net -1965.2 -393.3        Emesis Occurrence  1 x      _______________________________________________________________ Labs: CBC Latest Ref Rng & Units 02/17/2021 02/16/2021 02/16/2021  WBC 4.0 - 10.5 K/uL 15.1(H) 15.3(H) -  Hemoglobin 13.0 - 17.0 g/dL 12.8(L) 13.1 13.3  Hematocrit 39.0 - 52.0 % 37.5(L) 38.6(L) 39.0  Platelets 150 - 400 K/uL 143(L) 142(L) -   CMP Latest Ref Rng & Units 02/17/2021 02/16/2021 02/16/2021  Glucose 70 - 99 mg/dL 130(H) 114(H) -  BUN 8 - 23 mg/dL 9 8 -  Creatinine 0.61 - 1.24 mg/dL 0.68  0.57(L) -  Sodium 135 - 145 mmol/L 137 138 140  Potassium 3.5 - 5.1 mmol/L 3.8 3.5 4.2  Chloride 98 - 111 mmol/L 110 109 -  CO2 22 - 32 mmol/L 21(L) 22 -  Calcium 8.9 - 10.3 mg/dL 7.4(L) 7.2(L) -  Total Protein 6.5 - 8.1 g/dL - - -  Total Bilirubin 0.3 - 1.2 mg/dL - - -  Alkaline Phos 38 - 126 U/L - - -  AST 15 - 41 U/L - - -  ALT 0 - 44 U/L - - -    CXR: Pulmonary vascular congestion  _______________________________________________________________  Assessment and Plan: POD 1 s/p CABG  Neuro: Pain controlled CV: We will remove A-line today.  On aspirin statin beta-blocker. Pulm: We will keep chest tubes for now.  Continue pulmonary hygiene Renal: Good urine output overnight.  Creatinine stable. GI: Advancing diet Heme: Stable ID: Afebrile Endo: Sliding scale insulin Dispo: Floor tomorrow.   Lajuana Matte 02/17/2021 3:10 PM

## 2021-02-17 NOTE — Progress Notes (Signed)
Patient ID: Jesus BUWALDA Sr., male   DOB: 12/28/1955, 66 y.o.   MRN: [id number]  TCTS Evening Rounds:   Hemodynamically stable  Sinus rhythm Some nausea today. Will add Reglan for 4 doses.  Urine output good  CT output low  CBC    Component Value Date/Time   WBC 14.8 (H) 02/17/2021 1716   RBC 4.03 (L) 02/17/2021 1716   HGB 12.2 (L) 02/17/2021 1716   HGB 17.8 (H) 02/10/2021 1431   HCT 37.3 (L) 02/17/2021 1716   HCT 52.5 (H) 02/10/2021 1431   PLT 139 (L) 02/17/2021 1716   PLT 235 02/10/2021 1431   MCV 92.6 02/17/2021 1716   MCV 89 02/10/2021 1431   MCH 30.3 02/17/2021 1716   MCHC 32.7 02/17/2021 1716   RDW 14.3 02/17/2021 1716   RDW 13.7 02/10/2021 1431   LYMPHSABS 2.2 02/10/2021 1431   EOSABS 0.1 02/10/2021 1431   BASOSABS 0.1 02/10/2021 1431     BMET    Component Value Date/Time   NA 135 02/17/2021 1716   NA 136 02/10/2021 1431   K 3.8 02/17/2021 1716   CL 103 02/17/2021 1716   CO2 26 02/17/2021 1716   GLUCOSE 154 (H) 02/17/2021 1716   BUN 13 02/17/2021 1716   BUN 9 02/10/2021 1431   CREATININE 0.73 02/17/2021 1716   CALCIUM 8.4 (L) 02/17/2021 1716   GFRNONAA >60 02/17/2021 1716     A/P:  Stable postop course. Continue current plans

## 2021-02-17 NOTE — Hospital Course (Addendum)
History of Present Illness:     This is a 65 year old male with a past medical history of hypertension, diabetes mellitus-type II, hyperlipidemia, tobacco abuse, lumbar spondylosis, and BPH who presented to cardiology's office on 02/06/2021 with complaints of DOE and lower extremity edema. According to the patient, he was in [place] for vacation in June. When he returned he got COVID 19. Of note, he has had 2 vaccines and a booster. He denied fever or cough. He just felty "awful". He was given Paxlovid. He had been instructed if he had shortness of breath to seek medical care. Because he had shortness of breath, he called EMS. He said oxygenation was fine and they asked if he wanted and EKG, which was done. Bases on the findings of the EKG the then presented to his PCP.   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 Patient denies chest pain, syncope, or palpitations. He does have some dizziness when "it is very hot outside". Patient present to [facility] on [facility]/13/2021 for elective cardiac catheterization. Cardiac catheterization showed dilated aorta (4.7 cm) and severe ostial LAD eccentric stenosis (95%), partially involving distal left main.  Transthoracic echocardiogram showed an ejection fraction of 60 to 65%.  Right ventricular systolic function was normal.  There was mild aortic valve regurgitation but no aortic stenosis.  Dilation of the ascending aorta was confirmed measuring 45 mm by echo.  Dr. Kipp Brood was consulted for consideration of coronary artery bypass grafting surgery.  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 Until COVID, he travelled a lot within the [place] and abroad. He is LEFT hand dominant and has been cath'd via right radial artery.  Course in Hospital: Jesus Davenport remained stable following left heart catheterization.  Coronary bypass grafting was discussed with him in detail and he decided to proceed with surgery.  He was taken to the operating room on  02/16/2021 where two-vessel coronary bypass grafting was accomplished.  The left internal mammary artery was grafted to the left anterior descending coronary artery and endoscopically harvested saphenous vein from the right thigh was grafted to the obtuse marginal coronary artery.  Following the procedure, he separated from cardiopulmonary bypass without any difficulty.  He was transferred to the surgical ICU in stable condition.  Vital signs and hemodynamics remained stable.  He was weaned from the ventilator and extubated about 4 hours postoperatively.  On the first postoperative day, monitoring lines were removed.  He was mobilized. He was stable for transfer the the telemetry floor on POD 2. He had some sinus tachycardia therefore we were titrating his beta blocker as tolerated. His biggest obstacle was weaning off of oxygen due to his long history of smoking. We encouraged use of his incentive spirometer and ambulation in the halls. We have been slowly decreasing his oxygen as tolerated. He is down to 0.5 liter via . He continues to make slow and steady progress. Per documentation 08/21, he does not meet criteria for home oxygen. As discussed with Dr. Kipp Brood, patient is surgically stable for discharge.

## 2021-02-17 NOTE — Progress Notes (Signed)
Hypoglycemic Event  CBG 67  Treatment: 8m d50  Symptoms none  Follow-up CBG: Time:0338 CBG Result 157  Possible Reasons for Event: npo  Comments/MD notified protocol    Jesus Davenport, MTrilby Drummer

## 2021-02-18 ENCOUNTER — Inpatient Hospital Stay (HOSPITAL_COMMUNITY): Payer: Medicare Other

## 2021-02-18 ENCOUNTER — Other Ambulatory Visit (HOSPITAL_COMMUNITY): Payer: Self-pay

## 2021-02-18 LAB — BASIC METABOLIC PANEL
Anion gap: 4 — ABNORMAL LOW (ref 5–15)
BUN: 16 mg/dL (ref 8–23)
CO2: 29 mmol/L (ref 22–32)
Calcium: 8.5 mg/dL — ABNORMAL LOW (ref 8.9–10.3)
Chloride: 102 mmol/L (ref 98–111)
Creatinine, Ser: 0.72 mg/dL (ref 0.61–1.24)
GFR, Estimated: >60 mL/min (ref >60)
Glucose, Bld: 96 mg/dL (ref 70–99)
Potassium: 4 mmol/L (ref 3.5–5.1)
Sodium: 135 mmol/L (ref 135–145)

## 2021-02-18 LAB — GLUCOSE, CAPILLARY
Glucose-Capillary: 114 mg/dL — ABNORMAL HIGH (ref 70–99)
Glucose-Capillary: 122 mg/dL — ABNORMAL HIGH (ref 70–99)
Glucose-Capillary: 151 mg/dL — ABNORMAL HIGH (ref 70–99)
Glucose-Capillary: 68 mg/dL — ABNORMAL LOW (ref 70–99)
Glucose-Capillary: 94 mg/dL (ref 70–99)
Glucose-Capillary: 95 mg/dL (ref 70–99)
Glucose-Capillary: 95 mg/dL (ref 70–99)

## 2021-02-18 LAB — CBC
HCT: 36.5 % — ABNORMAL LOW (ref 39.0–52.0)
Hemoglobin: 11.9 g/dL — ABNORMAL LOW (ref 13.0–17.0)
MCH: 30.4 pg (ref 26.0–34.0)
MCHC: 32.6 g/dL (ref 30.0–36.0)
MCV: 93.4 fL (ref 80.0–100.0)
Platelets: 135 10*3/uL — ABNORMAL LOW (ref 150–400)
RBC: 3.91 MIL/uL — ABNORMAL LOW (ref 4.22–5.81)
RDW: 14.5 % (ref 11.5–15.5)
WBC: 14.2 10*3/uL — ABNORMAL HIGH (ref 4.0–10.5)
nRBC: 0 % (ref 0.0–0.2)

## 2021-02-18 MED ORDER — SODIUM CHLORIDE 0.9% FLUSH
3.0000 mL | Freq: Two times a day (BID) | INTRAVENOUS | Status: DC
  Administered 2021-02-18 – 2021-02-22 (×6): 3 mL via INTRAVENOUS

## 2021-02-18 MED ORDER — ~~LOC~~ CARDIAC SURGERY, PATIENT & FAMILY EDUCATION: Freq: Once | Status: AC

## 2021-02-18 MED ORDER — FUROSEMIDE 10 MG/ML IJ SOLN
40.0000 mg | Freq: Once | INTRAMUSCULAR | Status: AC
  Administered 2021-02-18: 40 mg via INTRAVENOUS
  Filled 2021-02-18: qty 4

## 2021-02-18 MED ORDER — SODIUM CHLORIDE 0.9% FLUSH
10.0000 mL | Freq: Two times a day (BID) | INTRAVENOUS | Status: DC
  Administered 2021-02-18 (×2): 10 mL

## 2021-02-18 MED ORDER — SODIUM CHLORIDE 0.9% FLUSH: 3.0000 mL | INTRAVENOUS | Status: DC | PRN

## 2021-02-18 MED ORDER — SODIUM CHLORIDE 0.9% FLUSH: 10.0000 mL | INTRAVENOUS | Status: DC | PRN

## 2021-02-18 MED ORDER — SODIUM CHLORIDE 0.9 % IV SOLN: 250.0000 mL | INTRAVENOUS | Status: DC | PRN

## 2021-02-18 NOTE — Progress Notes (Signed)
CARDIAC REHAB PHASE I   PRE:  Rate/Rhythm: 97 SR  BP:  Supine: 112/82  Sitting: 121/70  Standing:    SaO2: 98% 6L  MODE:  Ambulation: 190 ft   POST:  Rate/Rhythm: 102 ST  BP:  Supine: 1137/72  Sitting:   Standing:    SaO2: 91-93% 6L 1255-1352 Pt walked 190 ft on 6L with EVA, gait belt use and asst x 1. Encouraged pursed lip breathing during walk. Sats maintained above 90% on 6L. Will try to wean next walk. Back to bed after walk and got pt comfortable . Encouraged IS. Pt stated he was a little lightheaded this morning when up but better this walk.   Graylon Good, RN BSN  02/18/2021 1:49 PM

## 2021-02-18 NOTE — Progress Notes (Signed)
      [address]       [place] [address]             [phone]                 2 Days Post-Op Procedure(s) (LRB): CORONARY ARTERY BYPASS GRAFTING (CABG)x 2 ON CARDIOPULMONARY BYPASS USING RIGHT GSV AND LIMA. (N/A) TRANSESOPHAGEAL ECHOCARDIOGRAM (TEE) (N/A) ENDOVEIN HARVEST OF GREATER SAPHENOUS VEIN (Right) APPLICATION OF CELL SAVER   Events: No events _______________________________________________________________ Vitals: BP 110/77   Pulse 85   Temp 99.5 F (37.5 C)   Resp (!) 21   Ht '5\' 8"'$  (1.727 m)   Wt 95.8 kg   SpO2 94%   BMI 32.11 kg/m   - Neuro: Alert NAD  - Cardiovascular: Sinus  Drips: None.      - Pulm: Easy work of breathing    ABG    Component Value Date/Time   PHART 7.292 (L) 02/16/2021 1632   PCO2ART 48.1 (H) 02/16/2021 1632   PO2ART 71 (L) 02/16/2021 1632   HCO3 23.3 02/16/2021 1632   TCO2 25 02/16/2021 1632   ACIDBASEDEF 4.0 (H) 02/16/2021 1632   O2SAT 92.0 02/16/2021 1632    - Abd: Soft - Extremity: Warm  .Intake/Output      08/16 0701 08/17 0700 08/17 0701 08/18 0700   P.O. 120    I.V. (mL/kg) 124.7 (1.3)    Blood     Other 0    IV Piggyback 350    Total Intake(mL/kg) 594.6 (6.2)    Urine (mL/kg/hr) 1055 (0.5)    Emesis/NG output 300    Other     Blood     Chest Tube 80    Total Output 1435    Net -840.4         Emesis Occurrence 1 x       _______________________________________________________________ Labs: CBC Latest Ref Rng & Units 02/18/2021 02/17/2021 02/17/2021  WBC 4.0 - 10.5 K/uL 14.2(H) 14.8(H) 15.1(H)  Hemoglobin 13.0 - 17.0 g/dL 11.9(L) 12.2(L) 12.8(L)  Hematocrit 39.0 - 52.0 % 36.5(L) 37.3(L) 37.5(L)  Platelets 150 - 400 K/uL 135(L) 139(L) 143(L)   CMP Latest Ref Rng & Units 02/18/2021 02/17/2021 02/17/2021  Glucose 70 - 99 mg/dL 96 154(H) 130(H)  BUN 8 - 23 mg/dL '16 13 9  '$ Creatinine 0.61 - 1.24 mg/dL 0.72 0.73 0.68  Sodium 135 - 145 mmol/L 135 135 137  Potassium 3.5 - 5.1 mmol/L 4.0  3.8 3.8  Chloride 98 - 111 mmol/L 102 103 110  CO2 22 - 32 mmol/L 29 26 21(L)  Calcium 8.9 - 10.3 mg/dL 8.5(L) 8.4(L) 7.4(L)  Total Protein 6.5 - 8.1 g/dL - - -  Total Bilirubin 0.3 - 1.2 mg/dL - - -  Alkaline Phos 38 - 126 U/L - - -  AST 15 - 41 U/L - - -  ALT 0 - 44 U/L - - -    CXR: Pulmonary vascular congestion  _______________________________________________________________  Assessment and Plan: POD 2 s/p CABG  Neuro: Pain controlled CV:  On aspirin statin beta-blocker. Pulm: will remove CTs  Continue pulmonary hygiene Renal: Good urine output overnight.  Creatinine stable. GI: Advancing diet Heme: Stable ID: Afebrile Endo: Sliding scale insulin Dispo: Floor today   Lajuana Matte 02/18/2021 8:18 AM

## 2021-02-18 NOTE — TOC Benefit Eligibility Note (Signed)
Patient Teacher, English as a foreign language completed.    The patient is currently admitted and upon discharge could be taking Farxiga 10 mg.  The current 30 day co-pay is, $488.15 due to a $480.00 deductible remaining .   The patient is currently admitted and upon discharge could be taking Jardiance 10 mg.  The current 30 day co-pay is, $428.696 due to a $480.00 deductible remaining .   The patient is insured through [place], [place] Patient Advocate Specialist Daisy Team Direct Number: [phone]  Fax: [phone]

## 2021-02-19 LAB — GLUCOSE, CAPILLARY
Glucose-Capillary: 102 mg/dL — ABNORMAL HIGH (ref 70–99)
Glucose-Capillary: 104 mg/dL — ABNORMAL HIGH (ref 70–99)
Glucose-Capillary: 105 mg/dL — ABNORMAL HIGH (ref 70–99)
Glucose-Capillary: 114 mg/dL — ABNORMAL HIGH (ref 70–99)
Glucose-Capillary: 115 mg/dL — ABNORMAL HIGH (ref 70–99)
Glucose-Capillary: 93 mg/dL (ref 70–99)
Glucose-Capillary: 94 mg/dL (ref 70–99)

## 2021-02-19 LAB — BASIC METABOLIC PANEL
Anion gap: 9 (ref 5–15)
BUN: 12 mg/dL (ref 8–23)
CO2: 30 mmol/L (ref 22–32)
Calcium: 8.4 mg/dL — ABNORMAL LOW (ref 8.9–10.3)
Chloride: 96 mmol/L — ABNORMAL LOW (ref 98–111)
Creatinine, Ser: 0.61 mg/dL (ref 0.61–1.24)
GFR, Estimated: >60 mL/min (ref >60)
Glucose, Bld: 97 mg/dL (ref 70–99)
Potassium: 3.4 mmol/L — ABNORMAL LOW (ref 3.5–5.1)
Sodium: 135 mmol/L (ref 135–145)

## 2021-02-19 LAB — CBC
HCT: 34.8 % — ABNORMAL LOW (ref 39.0–52.0)
Hemoglobin: 11.8 g/dL — ABNORMAL LOW (ref 13.0–17.0)
MCH: 30.6 pg (ref 26.0–34.0)
MCHC: 33.9 g/dL (ref 30.0–36.0)
MCV: 90.4 fL (ref 80.0–100.0)
Platelets: 141 10*3/uL — ABNORMAL LOW (ref 150–400)
RBC: 3.85 MIL/uL — ABNORMAL LOW (ref 4.22–5.81)
RDW: 14.5 % (ref 11.5–15.5)
WBC: 12.4 10*3/uL — ABNORMAL HIGH (ref 4.0–10.5)
nRBC: 0 % (ref 0.0–0.2)

## 2021-02-19 MED ORDER — POTASSIUM CHLORIDE CRYS ER 10 MEQ PO TBCR
20.0000 meq | EXTENDED_RELEASE_TABLET | Freq: Once | ORAL | Status: AC
  Administered 2021-02-19: 20 meq via ORAL
  Filled 2021-02-19: qty 2

## 2021-02-19 MED ORDER — POTASSIUM CHLORIDE CRYS ER 20 MEQ PO TBCR
30.0000 meq | EXTENDED_RELEASE_TABLET | Freq: Two times a day (BID) | ORAL | Status: AC
  Administered 2021-02-19: 10 meq via ORAL
  Administered 2021-02-19: 30 meq via ORAL
  Filled 2021-02-19 (×2): qty 1

## 2021-02-19 MED FILL — Potassium Chloride Inj 2 mEq/ML: INTRAVENOUS | Qty: 40 | Status: AC

## 2021-02-19 MED FILL — Lidocaine HCl Local Preservative Free (PF) Inj 2%: INTRAMUSCULAR | Qty: 15 | Status: AC

## 2021-02-19 MED FILL — Mannitol IV Soln 20%: INTRAVENOUS | Qty: 500 | Status: AC

## 2021-02-19 MED FILL — Sodium Chloride IV Soln 0.9%: INTRAVENOUS | Qty: 2000 | Status: AC

## 2021-02-19 MED FILL — Electrolyte-R (PH 7.4) Solution: INTRAVENOUS | Qty: 4000 | Status: AC

## 2021-02-19 MED FILL — Sodium Bicarbonate IV Soln 8.4%: INTRAVENOUS | Qty: 50 | Status: AC

## 2021-02-19 MED FILL — Heparin Sodium (Porcine) Inj 1000 Unit/ML: INTRAMUSCULAR | Qty: 10 | Status: AC

## 2021-02-19 MED FILL — Calcium Chloride Inj 10%: INTRAVENOUS | Qty: 10 | Status: AC

## 2021-02-19 MED FILL — Heparin Sodium (Porcine) Inj 1000 Unit/ML: INTRAMUSCULAR | Qty: 30 | Status: AC

## 2021-02-19 NOTE — Progress Notes (Signed)
Mobility Specialist: Progress Note   02/19/21 1817  Mobility  Activity Ambulated in hall  Level of Assistance Contact guard assist, steadying assist  Assistive Device Front wheel walker  Distance Ambulated (ft) 300 ft  Mobility Ambulated with assistance in hallway  Mobility Response Tolerated well  Mobility performed by Mobility specialist  $Mobility charge 1 Mobility   Pre-Mobility: 106 HR, 126/77 BP, 92% SpO2 During Mobility: 123 HR, 92% SpO2 Post-Mobility: 107 HR, 109/76 BP, 92% SpO2  Pt required contact guard during ambulation, asx throughout. Pt back to bed after walk with call bell and phone at his side.   [facility] Jesus Davenport Mobility Specialist Mobility Specialist Phone: [phone]

## 2021-02-19 NOTE — Progress Notes (Signed)
CARDIAC REHAB PHASE I   PRE:  Rate/Rhythm: 97 SR  BP:  Supine: 113/76  Sitting:   Standing:    SaO2: 95% 5L  MODE:  Ambulation: 250 ft   POST:  Rate/Rhythm: 112 ST  BP:  Supine:   Sitting: 129/83  Standing:    SaO2: 92% 4L 0937-1015 Pt walked 250 ft on 4L with rolling walker and asst x 1. Sats maintained above 90% on 4L so left on 4L in room. To recliner with call bell. Encouraged two more walks and IS to help get off oxygen. Tolerated well. Son in room.   Graylon Good, RN BSN  02/19/2021 10:11 AM

## 2021-02-19 NOTE — Discharge Summary (Signed)
Physician Discharge Summary  Patient ID: Jesus Davenport. MRN: [id number] DOB/AGE: 1955-10-27 65 y.o.  Admit date: 02/13/2021 Discharge date: 02/23/2021  Admission Diagnoses: Coronary artery disease Unstable angina pectoris Type 2 diabetes mellitus Tobacco use Hypertension  Discharge Diagnoses:   Coronary artery disease Unstable angina pectoris Type 2 diabetes mellitus Tobacco use Hypertension S/P CABG x 2 Expected acute blood loss anemia Expected postoperative volume excess  Discharged Condition: stable  History of Present Illness:     This is a 65 year old male with a past medical history of hypertension, diabetes mellitus-type II, hyperlipidemia, tobacco abuse, lumbar spondylosis, and BPH who presented to cardiology's office on 02/06/2021 with complaints of DOE and lower extremity edema. According to the patient, he was in [place] for vacation in June. When he returned he got COVID 19. Of note, he has had 2 vaccines and a booster. He denied fever or cough. He just felty "awful". He was given Paxlovid. He had been instructed if he had shortness of breath to seek medical care. Because he had shortness of breath, he called EMS. He said oxygenation was fine and they asked if he wanted and EKG, which was done. Bases on the findings of the EKG the then presented to his PCP.   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 Patient denies chest pain, syncope, or palpitations. He does have some dizziness when "it is very hot outside". Patient present to [facility] on 02/13/2021 for elective cardiac catheterization. Cardiac catheterization showed dilated aorta (4.7 cm) and severe ostial LAD eccentric stenosis (95%), partially involving distal left main.  Transthoracic echocardiogram showed an ejection fraction of 60 to 65%.  Right ventricular systolic function was normal.  There was mild aortic valve regurgitation but no aortic stenosis.  Dilation of the ascending aorta was  confirmed measuring 45 mm by echo.  Dr. Kipp Brood was consulted for consideration of coronary artery bypass grafting surgery.  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 Until COVID, he travelled a lot within the [place] and abroad. He is LEFT hand dominant and has been cath'd via right radial artery.  Course in Hospital: Mr. Polkinghorn remained stable following left heart catheterization.  Coronary bypass grafting was discussed with him in detail and he decided to proceed with surgery.  He was taken to the operating room on 02/16/2021 where two-vessel coronary bypass grafting was accomplished.  The left internal mammary artery was grafted to the left anterior descending coronary artery and endoscopically harvested saphenous vein from the right thigh was grafted to the obtuse marginal coronary artery.  Following the procedure, he separated from cardiopulmonary bypass without any difficulty.  He was transferred to the surgical ICU in stable condition.  Vital signs and hemodynamics remained stable.  He was weaned from the ventilator and extubated about 4 hours postoperatively.  On the first postoperative day, monitoring lines were removed.  He was mobilized. He was stable for transfer the the telemetry floor on POD 2. He had some sinus tachycardia therefore we were titrating his beta blocker as tolerated. His biggest obstacle was weaning off of oxygen due to his long history of smoking. We encouraged use of his incentive spirometer and ambulation in the halls. We have been slowly decreasing his oxygen as tolerated. He continues to make slow and steady progress.   Per documentation 08/21, he does not meet criteria for home oxygen. As discussed with Dr. Kipp Brood, patient is surgically stable for discharge.  Consults: None  Significant Diagnostic Studies:  CLINICAL DATA:  Evaluate pleural effusion.   EXAM: PORTABLE CHEST 1 VIEW   COMPARISON:  02/17/21   FINDINGS: There is a right IJ catheter  with tip in the projection of the SVC. Status post median sternotomy and CABG procedure. Stable cardiomediastinal contours. Pulmonary vascular congestion is identified. There is a small left pleural effusion. Atelectasis is noted within the left lower lobe.   IMPRESSION: 1. Small left pleural effusion and left lower lobe atelectasis. 2. Pulmonary vascular congestion.     Electronically Signed   By: Kerby Moors M.D.   On: 02/18/2021 08:45    Treatments:   Op Note   02/16/2021 Patient:  Jesus Davenport. 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  LIMA LAD, RSVG OM1   Endoscopic greater saphenous vein harvest on the right     Surgeon and Role:      * Lightfoot, Lucile Crater, MD - Primary    * M. Roddenberry - assisting   Anesthesia  general EBL:  557m Blood Administration: none Xclamp Time:  28 min Pump Time:  696m   Drains: 1953 blake drain: L, mediastinal  Wires: none Counts: correct     Indications:  [age]ear old male admitted following elective left heart cath which shows severe left main disease.  His echocardiogram shows preserved biventricular function and no significant valvular disease.  He is tentatively scheduled for 02/16/2021 for two-vessel CABG. Findings: Good Vein, good LIMA.  Soft LAD target.  Intramyocardial OM target.  Ascending aortic aneuryms, with mild-moderate AI on TEE.  Root measured 3.7cm.    Discharge Exam: Blood pressure (!) 125/93, pulse 92, temperature 98 F (36.7 C), temperature source Oral, resp. rate 15, height '5\' 8"'$  (1.727 m), weight 89.8 kg, SpO2 91 %. Cardiovascular: RRR Pulmonary: Clear to auscultation bilaterally Abdomen: Soft, non tender, bowel sounds present. Extremities: Mild bilateral lower extremity edema. Wounds: Clean and dry.  No erythema or signs of infection.  Disposition: Stable and discharged to home.  Discharge Instructions     Amb Referral to  Cardiac Rehabilitation   Complete by: As directed    Diagnosis: CABG   CABG X ___: 2   After initial evaluation and assessments completed: Virtual Based Care may be provided alone or in conjunction with Phase 2 Cardiac Rehab based on patient barriers.: Yes      Allergies as of 02/23/2021   No Known Allergies      Medication List     STOP taking these medications    amLODipine 10 MG tablet Commonly known as: NORVASC   diclofenac 50 MG EC tablet Commonly known as: VOLTAREN       TAKE these medications    aspirin 325 MG EC tablet Take 1 tablet (325 mg total) by mouth daily.   atorvastatin 80 MG tablet Commonly known as: LIPITOR Take 1 tablet (80 mg total) by mouth daily. What changed:  medication strength how much to take   dutasteride 0.5 MG capsule Commonly known as: AVODART Take 1 capsule (0.5 mg total) by mouth daily.   furosemide 40 MG tablet Commonly known as: LASIX Take 1 tablet (40 mg total) by mouth daily. For 5 days then stop.   lisinopril 10 MG tablet Commonly known as: ZESTRIL Take 1 tablet (10 mg total) by mouth daily. What changed:  medication strength how much to take   metoprolol tartrate  25 MG tablet Commonly known as: LOPRESSOR Take 1 tablet (25 mg total) by mouth 2 (two) times daily.   pioglitazone 30 MG tablet Commonly known as: Actos Take 1 tablet (30 mg total) by mouth daily.   potassium chloride SA 20 MEQ tablet Commonly known as: KLOR-CON Take 1 tablet (20 mEq total) by mouth daily. For 5 days then stop.   traMADol 50 MG tablet Commonly known as: ULTRAM Take 1 tablet (50 mg total) by mouth every 6 (six) hours as needed for moderate pain.       The patient has been discharged on:   1.Beta Blocker:  Yes [  x ]                              No   [   ]                              If No, reason:  2.Ace Inhibitor/ARB: Yes [ x  ]                                     No  [    ]                                     If No,  reason:  3.Statin:   Yes [ x  ]                  No  [   ]                  If No, reason:  4.Ecasa:  Yes  [ x  ]                  No   [   ]                  If No, reason:  Patient had ACS upon admission:NO  Plavix/P2Y12 inhibitor: Yes [   ]                                      No  [  x ]    Follow-up Information     Lajuana Matte, MD. Call on 02/27/2021.   Specialty: Cardiothoracic Surgery Why: This is a virtual office visit.  Do not go to the office.  Dr. Kipp Brood will call around 2:50 PM Contact information: [place] [address] Kenyon, Kardie, DO Follow up on 03/12/2021.   Specialty: Cardiology Why: Your appointment is at 2:20 PM. Contact information: [address] [place] [address] [phone]                 Signed: Arnoldo Lenis 02/23/2021, 7:54 AM

## 2021-02-19 NOTE — Progress Notes (Addendum)
      [address]       [place] [address]             [phone]      3 Days Post-Op Procedure(s) (LRB): CORONARY ARTERY BYPASS GRAFTING (CABG)x 2 ON CARDIOPULMONARY BYPASS USING RIGHT GSV AND LIMA. (N/A) TRANSESOPHAGEAL ECHOCARDIOGRAM (TEE) (N/A) ENDOVEIN HARVEST OF GREATER SAPHENOUS VEIN (Right) APPLICATION OF CELL SAVER Subjective: Feels okay this morning, still having some shortness of breath when ambulating.   Objective: Vital signs in last 24 hours: Temp:  [98.1 F (36.7 C)-98.5 F (36.9 C)] 98.3 F (36.8 C) (08/18 0337) Pulse Rate:  [80-101] 80 (08/18 0337) Cardiac Rhythm: Sinus tachycardia;Bundle branch block (08/17 2006) Resp:  [10-24] 18 (08/18 0337) BP: (109-138)/(66-89) 134/89 (08/18 0337) SpO2:  [94 %-97 %] 96 % (08/18 0337)     Intake/Output from previous day: 08/17 0701 - 08/18 0700 In: 650 [P.O.:600; IV Piggyback:50] Out: [id number] [Urine:1775; Chest Tube:10] Intake/Output this shift: No intake/output data recorded.  General appearance: alert, cooperative, and no distress Heart: regular rate and rhythm, S1, S2 normal, no murmur, click, rub or gallop Lungs: clear to auscultation bilaterally Abdomen: soft, non-tender; bowel sounds normal; no masses,  no organomegaly Extremities: extremities normal, atraumatic, no cyanosis or edema Wound: clean and dry  Lab Results: Recent Labs    02/18/21 0401 02/19/21 0218  WBC 14.2* 12.4*  HGB 11.9* 11.8*  HCT 36.5* 34.8*  PLT 135* 141*   BMET:  Recent Labs    02/18/21 0401 02/19/21 0218  NA 135 135  K 4.0 3.4*  CL 102 96*  CO2 29 30  GLUCOSE 96 97  BUN 16 12  CREATININE 0.72 0.61  CALCIUM 8.5* 8.4*    PT/INR:  Recent Labs    02/16/21 1208  LABPROT 18.3*  INR 1.5*   ABG    Component Value Date/Time   PHART 7.292 (L) 02/16/2021 1632   HCO3 23.3 02/16/2021 1632   TCO2 25 02/16/2021 1632   ACIDBASEDEF 4.0 (H) 02/16/2021 1632   O2SAT 92.0 02/16/2021 1632   CBG (last 3)   Recent Labs    02/18/21 2023 02/19/21 0016 02/19/21 0330  GLUCAP 94 104* 105*    Assessment/Plan: S/P Procedure(s) (LRB): CORONARY ARTERY BYPASS GRAFTING (CABG)x 2 ON CARDIOPULMONARY BYPASS USING RIGHT GSV AND LIMA. (N/A) TRANSESOPHAGEAL ECHOCARDIOGRAM (TEE) (N/A) ENDOVEIN HARVEST OF GREATER SAPHENOUS VEIN (Right) APPLICATION OF CELL SAVER  CV-NSR to ST in the low 100s, BP well controlled. Continue asa, statin, and BB Pulm-CXR yesterday stable with small pleural effusion and left lower lobe atelectasis, pulmonary vascular congestion. Continue IS. Heavy smoker and still requiring 6L [place] Renal-creatinine 0.61, hypokalemia, will replace H and H -11.8/34.8, expected acute blood loss anemia Endo-blood glucose well controlled   Plan: Working on Chiropodist, encouraged ambulation, weaning oxygen as able. Once his BP allows will titrate BB.     LOS: 6 days    Elgie Collard 02/19/2021  Agree with above. Diuresis today. Titrate beta-blocker. Continue physical therapy.  Jesus Davenport

## 2021-02-19 NOTE — Discharge Instructions (Addendum)
Discharge Instructions:  1. You may shower, please wash incisions daily with soap and water and keep dry.  If you wish to cover wounds with dressing you may do so but please keep clean and change daily.  No tub baths or swimming until incisions have completely healed.  If your incisions become red or develop any drainage please call our office at [phone]  2. No Driving until cleared by Dr. Abran Duke office and you are no longer using narcotic pain medications  3. Monitor your weight daily.. Please use the same scale and weigh at same time... If you gain 5-10 lbs in 48 hours with associated lower extremity swelling, please contact our office at [phone]  4. Fever of 101.5 for at least 24 hours with no source, please contact our office at [phone]  5. Activity- up as tolerated, please walk at least 3 times per day.  Avoid strenuous activity, no lifting, pushing, or pulling with your arms over 8-10 lbs for a minimum of 6 weeks  6. If any questions or concerns arise, please do not hesitate to contact our office at [place], Consistent Carbohydrate Nutrition Therapy   A heart-healthy and consistent carbohydrate diet is recommended to manage heart disease and diabetes. To follow a heart-healthy and consistent carbohydrate diet, Eat a balanced diet with whole grains, fruits and vegetables, and lean protein sources.  Choose heart-healthy unsaturated fats. Limit saturated fats, trans fats, and cholesterol intake. Eat more plant-based or vegetarian meals using beans and soy foods for protein.  Eat whole, unprocessed foods to limit the amount of sodium (salt) you eat.  Choose a consistent amount of carbohydrate at each meal and snack. Limit refined carbohydrates especially sugar, sweets and sugar-sweetened beverages.  If you drink alcohol, do so in moderation: one serving per day (women) and two servings per day (men). o One serving is equivalent to 12 ounces beer, 5  ounces wine, or 1.5 ounces distilled spirits  Tips Tips for Choosing Heart-Healthy Fats Choose lean protein and low-fat dairy foods to reduce saturated fat intake. Saturated fat is usually found in animal-based protein and is associated with certain health risks. Saturated fat is the biggest contributor to raise low-density lipoprotein (LDL) cholesterol levels. Research shows that limiting saturated fat lowers unhealthy cholesterol levels. Eat no more than 7% of your total calories each day from saturated fat. Ask your RDN to help you determine how much saturated fat is right for you. There are many foods that do not contain large amounts of saturated fats. Swapping these foods to replace foods high in saturated fats will help you limit the saturated fat you eat and improve your cholesterol levels. You can also try eating more plant-based or vegetarian meals. Instead of. Try:  Whole milk, cheese, yogurt, and ice cream 1% or skim milk, low-fat cheese, non-fat yogurt, and low-fat ice cream  Fatty, marbled beef and pork Lean beef, pork, or venison  Poultry with skin Poultry without skin  Butter, stick margarine Reduced-fat, whipped, or liquid spreads  Coconut oil, palm oil Liquid vegetable oils: corn, canola, olive, soybean and safflower oils   Avoid foods that contain trans fats. Trans fats increase levels of LDL-cholesterol. Hydrogenated fat in processed foods is the main source of trans fats in foods.  Trans fats can be found in stick margarine, shortening, processed sweets, baked goods, some fried foods, and packaged foods made with hydrogenated oils. Avoid foods with "partially hydrogenated oil" on the ingredient list such as: cookies, pastries, baked  goods, biscuits, crackers, microwave popcorn, and frozen dinners. Choose foods with heart healthy fats. Polyunsaturated and monounsaturated fat are unsaturated fats that may help lower your blood cholesterol level when used in place of saturated fat  in your diet. Ask your RDN about taking a dietary supplement with plant sterols and stanols to help lower your cholesterol level. Research shows that substituting saturated fats with unsaturated fats is beneficial to cholesterol levels. Try these easy swaps: Instead of. Try:  Butter, stick margarine, or solid shortening Reduced-fat, whipped, or liquid spreads  Beef, pork, or poultry with skin Fish and seafood  Chips, crackers, snack foods Raw or unsalted nuts and seeds or nut butters Hummus with vegetables Avocado on toast  Coconut oil, palm oil Liquid vegetable oils: corn, canola, olive, soybean and safflower oils  Limit the amount of cholesterol you eat to less than 200 milligrams per day. Cholesterol is a substance carried through the bloodstream via lipoproteins, which are known as "transporters" of fat. Some body functions need cholesterol to work properly, but too much cholesterol in the bloodstream can damage arteries and build up blood vessel linings (which can lead to heart attack and stroke). You should eat less than 200 milligrams cholesterol per day. People respond differently to eating cholesterol. There is no test available right now that can figure out which people will respond more to dietary cholesterol and which will respond less. For individuals with high intake of dietary cholesterol, different types of increase (none, small, moderate, large) in LDL-cholesterol levels are all possible.  Food sources of cholesterol include egg yolks and organ meats such as liver, gizzards. Limit egg yolks to two to four per week and avoid organ meats like liver and gizzards to control cholesterol intake. Tips for Choosing Heart-Healthy Carbohydrates Consume a consistent amount of carbohydrate It is important to eat foods with carbohydrates in moderation because they impact your blood glucose level. Carbohydrates can be found in many foods such as: Grains (breads, crackers, rice, pasta, and  cereals)  Starchy Vegetables (potatoes, corn, and peas)  Beans and legumes  Milk, soy milk, and yogurt  Fruit and fruit juice  Sweets (cakes, cookies, ice cream, jam and jelly) Your RDN will help you set a goal for how many carbohydrate servings to eat at your meals and snacks. For many adults, eating 3 to 5 servings of carbohydrate foods at each meal and 1 or 2 carbohydrate servings for each snack works well.  Check your blood glucose level regularly. It can tell you if you need to adjust when you eat carbohydrates. Choose foods rich in viscous (soluble) fiber Viscous, or soluble, is found in the walls of plant cells. Viscous fiber is found only in plant-based foods. Eating foods with fiber helps to lower your unhealthy cholesterol and keep your blood glucose in range  Rich sources of viscous fiber include vegetables (asparagus, Brussels sprouts, sweet potatoes, turnips) fruit (apricots, mangoes, oranges), legumes, and whole grains (barley, oats, and oat bran).  As you increase your fiber intake gradually, also increase the amount of water you drink. This will help prevent constipation.  If you have difficulty achieving this goal, ask your RDN about fiber laxatives. Choose fiber supplements made with viscous fibers such as psyllium seed husks or methylcellulose to help lower unhealthy cholesterol.  Limit refined carbohydrates  There are three types of carbohydrates: starches, sugar, and fiber. Some carbohydrates occur naturally in food, like the starches in rice or corn or the sugars in fruits and milk. Refined  carbohydrates--foods with high amounts of simple sugars--can raise triglyceride levels. High triglyceride levels are associated with coronary heart disease. Some examples of refined carbohydrate foods are table sugar, sweets, and beverages sweetened with added sugar. Tips for Reducing Sodium (Salt) Although sodium is important for your body to function, too much sodium can be harmful for  people with high blood pressure. As sodium and fluid buildup in your tissues and bloodstream, your blood pressure increases. High blood pressure may cause damage to other organs and increase your risk for a stroke. Even if you take a pill for blood pressure or a water pill (diuretic) to remove fluid, it is still important to have less salt in your diet. Ask your doctor and RDN what amount of sodium is right for you. Avoid processed foods. Eat more fresh foods.  Fresh fruits and vegetables are naturally low in sodium, as well as frozen vegetables and fruits that have no added juices or sauces.  Fresh meats are lower in sodium than processed meats, such as bacon, sausage, and hotdogs. Read the nutrition label or ask your butcher to help you find a fresh meat that is low in sodium. Eat less salt--at the table and when cooking.  A single teaspoon of table salt has 2,300 mg of sodium.  Leave the salt out of recipes for pasta, casseroles, and soups.  Ask your RDN how to cook your favorite recipes without sodium Be a smart shopper.  Look for food packages that say "salt-free" or "sodium-free." These items contain less than 5 milligrams of sodium per serving.  "Very low-sodium" products contain less than 35 milligrams of sodium per serving.  "Low-sodium" products contain less than 140 milligrams of sodium per serving.  Beware for "Unsalted" or "No Added Salt" products. These items may still be high in sodium. Check the nutrition label. Add flavors to your food without adding sodium.  Try lemon juice, lime juice, fruit juice or vinegar.  Dry or fresh herbs add flavor. Try basil, bay leaf, dill, rosemary, parsley, sage, dry mustard, nutmeg, thyme, and paprika.  Pepper, red pepper flakes, and cayenne pepper can add spice t your meals without adding sodium. Hot sauce contains sodium, but if you use just a drop or two, it will not add up to much.  Buy a sodium-free seasoning blend or make your own at  home. Additional Lifestyle Tips Achieve and maintain a healthy weight. Talk with your RDN or your doctor about what is a healthy weight for you. Set goals to reach and maintain that weight.  To lose weight, reduce your calorie intake along with increasing your physical activity. A weight loss of 10 to 15 pounds could reduce LDL-cholesterol by 5 milligrams per deciliter. Participate in physical activity. Talk with your health care team to find out what types of physical activity are best for you. Set a plan to get about 30 minutes of exercise on most days.  Foods Recommended Food Group Foods Recommended  Grains Whole grain breads and cereals, including whole wheat, barley, rye, buckwheat, corn, teff, quinoa, millet, amaranth, brown or wild rice, sorghum, and oats Pasta, especially whole wheat or other whole grain types  [employer], quinoa or wild rice Whole grain crackers, bread, rolls, pitas Home-made bread with reduced-sodium baking soda  Protein Foods Lean cuts of beef and pork (loin, leg, round, extra lean hamburger)  Skinless Cytogeneticist and other wild game Dried beans and peas Nuts and nut butters Meat alternatives made with soy or textured  vegetable protein  Egg whites or egg substitute Cold cuts made with lean meat or soy protein  Dairy Nonfat (skim), low-fat, or 1%-fat milk  Nonfat or low-fat yogurt or cottage cheese Fat-free and low-fat cheese  Vegetables Fresh, frozen, or canned vegetables without added fat or salt   Fruits Fresh, frozen, canned, or dried fruit   Oils Unsaturated oils (corn, olive, peanut, soy, sunflower, canola)  Soft or liquid margarines and vegetable oil spreads  Salad dressings Seeds and nuts  Avocado   Foods Not Recommended Food Group Foods Not Recommended  Grains Breads or crackers topped with salt Cereals (hot or cold) with more than 300 mg sodium per serving Biscuits, cornbread, and other "quick" breads prepared with baking  soda Bread crumbs or stuffing mix from a store High-fat bakery products, such as doughnuts, biscuits, croissants, danish pastries, pies, cookies Instant cooking foods to which you add hot water and stir--potatoes, noodles, rice, etc. Packaged starchy foods--seasoned noodle or rice dishes, stuffing mix, macaroni and cheese dinner Snacks made with partially hydrogenated oils, including chips, cheese puffs, snack mixes, regular crackers, butter-flavored popcorn  Protein Foods Higher-fat cuts of meats (ribs, t-bone steak, regular hamburger) Bacon, sausage, or hot dogs Cold cuts, such as salami or bologna, deli meats, cured meats, corned beef Organ meats (liver, brains, gizzards, sweetbreads) Poultry with skin Fried or smoked meat, poultry, and fish Whole eggs and egg yolks (more than 2-4 per week) Salted legumes, nuts, seeds, or nut/seed butters Meat alternatives with high levels of sodium (>300 mg per serving) or saturated fat (>5 g per serving)  Dairy Whole milk,?2% fat milk, buttermilk Whole milk yogurt or ice cream Cream Half-&-half Cream cheese Sour cream Cheese  Vegetables Canned or frozen vegetables with salt, fresh vegetables prepared with salt, butter, cheese, or cream sauce Fried vegetables Pickled vegetables such as olives, pickles, or sauerkraut  Fruits Fried fruits Fruits served with butter or cream  Oils Butter, stick margarine, shortening Partially hydrogenated oils or trans fats Tropical oils (coconut, palm, palm kernel oils)  Other Candy, sugar sweetened soft drinks and desserts Salt, sea salt, garlic salt, and seasoning mixes containing salt Bouillon cubes Ketchup, barbecue sauce, Worcestershire sauce, soy sauce, teriyaki sauce Miso Salsa Pickles, olives, relish   Heart Healthy Consistent Carbohydrate Vegetarian (Lacto-Ovo) Sample 1-Day Menu  Breakfast 1 cup oatmeal, cooked (2 carbohydrate servings)   cup blueberries (1 carbohydrate serving)  11 almonds,  without salt  1 cup 1% milk (1 carbohydrate serving)  1 cup coffee  Morning Snack 1 cup fat-free plain yogurt (1 carbohydrate serving)  Lunch 1 whole wheat bun (1 carbohydrate servings)  1 black bean burger (1 carbohydrate servings)  1 slice cheddar cheese, low sodium  2 slices tomatoes  2 leaves lettuce  1 teaspoon mustard  1 small pear (1 carbohydrate servings)  1 cup green tea, unsweetened  Afternoon Snack 1/3 cup trail mix with nuts, seeds, and raisins, without salt (1 carbohydrate servinga)  Evening Meal  cup meatless chicken  2/3 cup brown rice, cooked (2 carbohydrate servings)  1 cup broccoli, cooked (2/3 carbohydrate serving)   cup carrots, cooked (1/3 carbohydrate serving)  2 teaspoons olive oil  1 teaspoon balsamic vinegar  1 whole wheat dinner roll (1 carbohydrate serving)  1 teaspoon margarine, soft, tub  1 cup 1% milk (1 carbohydrate serving)  Evening Snack 1 extra small banana (1 carbohydrate serving)  1 tablespoon peanut butter   Heart Healthy Consistent Carbohydrate Vegan Sample 1-Day Menu  Breakfast 1 cup oatmeal,  cooked (2 carbohydrate servings)   cup blueberries (1 carbohydrate serving)  11 almonds, without salt  1 cup soymilk fortified with calcium, vitamin B12, and vitamin D  1 cup coffee  Morning Snack 6 ounces soy yogurt (1 carbohydrate servings)  Lunch 1 whole wheat bun(1 carbohydrate servings)  1 black bean burger (1 carbohydrate serving)  2 slices tomatoes  2 leaves lettuce  1 teaspoon mustard  1 small pear (1 carbohydrate servings)  1 cup green tea, unsweetened  Afternoon Snack 1/3 cup trail mix with nuts, seeds, and raisins, without salt (1 carbohydrate servings)  Evening Meal  cup meatless chicken  2/3 cup brown rice, cooked (2 carbohydrate servings)  1 cup broccoli, cooked (2/3 carbohydrate serving)   cup carrots, cooked (1/3 carbohydrate serving)  2 teaspoons olive oil  1 teaspoon balsamic vinegar  1 whole wheat dinner roll (1  carbohydrate serving)  1 teaspoon margarine, soft, tub  1 cup soymilk fortified with calcium, vitamin B12, and vitamin D  Evening Snack 1 extra small banana (1 carbohydrate serving)  1 tablespoon peanut butter    Heart Healthy Consistent Carbohydrate Sample 1-Day Menu  Breakfast 1 cup cooked oatmeal (2 carbohydrate servings)  3/4 cup blueberries (1 carbohydrate serving)  1 ounce almonds  1 cup skim milk (1 carbohydrate serving)  1 cup coffee  Morning Snack 1 cup sugar-free nonfat yogurt (1 carbohydrate serving)  Lunch 2 slices whole-wheat bread (2 carbohydrate servings)  2 ounces lean [place] breast  1 ounce low-fat Swiss cheese  1 teaspoon mustard  1 slice tomato  1 lettuce leaf  1 small pear (1 carbohydrate serving)  1 cup skim milk (1 carbohydrate serving)  Afternoon Snack 1 ounce trail mix with unsalted nuts, seeds, and raisins (1 carbohydrate serving)  Evening Meal 3 ounces salmon  2/3 cup cooked brown rice (2 carbohydrate servings)  1 teaspoon soft margarine  1 cup cooked broccoli with 1/2 cup cooked carrots (1 carbohydrate serving  Carrots, cooked, boiled, drained, without salt  1 cup lettuce  1 teaspoon olive oil with vinegar for dressing  1 small whole grain roll (1 carbohydrate serving)  1 teaspoon soft margarine  1 cup unsweetened tea  Evening Snack 1 extra-small banana (1 carbohydrate serving)  Copyright 2020  [employer]. All rights reserved.

## 2021-02-20 LAB — GLUCOSE, CAPILLARY
Glucose-Capillary: 97 mg/dL (ref 70–99)
Glucose-Capillary: 115 mg/dL — ABNORMAL HIGH (ref 70–99)
Glucose-Capillary: 95 mg/dL (ref 70–99)
Glucose-Capillary: 121 mg/dL — ABNORMAL HIGH (ref 70–99)
Glucose-Capillary: 120 mg/dL — ABNORMAL HIGH (ref 70–99)

## 2021-02-20 MED ORDER — METOPROLOL TARTRATE 25 MG PO TABS
25.0000 mg | ORAL_TABLET | Freq: Two times a day (BID) | ORAL | Status: DC
  Administered 2021-02-20 – 2021-02-23 (×7): 25 mg via ORAL
  Filled 2021-02-20 (×7): qty 1

## 2021-02-20 MED ORDER — INSULIN ASPART 100 UNIT/ML IJ SOLN
0.0000 [IU] | Freq: Three times a day (TID) | INTRAMUSCULAR | Status: DC
  Administered 2021-02-22: 2 [IU] via SUBCUTANEOUS

## 2021-02-20 MED FILL — Sodium Chloride IV Soln 0.9%: INTRAVENOUS | Qty: 250 | Status: AC

## 2021-02-20 NOTE — Progress Notes (Signed)
CARDIAC REHAB PHASE I   PRE:  Rate/Rhythm: 111 ST  BP:  Supine: 127/89  Sitting:   Standing:    SaO2: 95% 3L    87%RA     95% 2L  MODE:  Ambulation: 330 ft   POST:  Rate/Rhythm: 114 ST  BP:  Supine:   Sitting: 144/84  Standing:    SaO2: 89-91% 2L   94% 2L room [phone] Education completed with pt and son who voiced understanding. Stressed importance of staying in the tube and sternal precautions. Reviewed importance of IS and walking instructions given. Gave diabetic and heart healthy diets. Wrote down how to view discharge video. Discussed CRP 2 and referred to [facility] program. Discussed wound care. Notified RN that pt would like rolling walker for home use. Wrote qualifying note in case needed for home oxygen. Pt walked 330 ft on 2L with rolling walker and minimal asst. Stopped several times to rest-stated sciatic hip bothersome. Encouraged to rest when feeling SOB and to take deep breaths. Took 2L to keep sats at 90%. Left on 2L and notified RN. To recliner with pt sitting on pillow. Call bell in reach. Encouraged more walks and IS to hopefully get off oxygen. Discussed smoking cessation. Pt stated he is not planning on smoking anymore. Handout given.   Graylon Good, RN BSN  02/20/2021 10:20 AM

## 2021-02-20 NOTE — Progress Notes (Addendum)
      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 (N/A) TRANSESOPHAGEAL ECHOCARDIOGRAM (TEE) (N/A) ENDOVEIN HARVEST OF GREATER SAPHENOUS VEIN (Right) APPLICATION OF CELL SAVER Subjective: Feels okay this morning, no complaints.   Objective: Vital signs in last 24 hours: Temp:  [97.7 F (36.5 C)-98.2 F (36.8 C)] 98 F (36.7 C) (08/19 0356) Pulse Rate:  [95-102] 100 (08/19 0356) Cardiac Rhythm: Normal sinus rhythm (08/18 1950) Resp:  [11-20] 11 (08/19 0356) BP: (106-139)/(73-89) 130/78 (08/19 0356) SpO2:  [94 %-98 %] 96 % (08/19 0356) Weight:  [94.8 kg] 94.8 kg (08/19 0527)     Intake/Output from previous day: 08/18 0701 - 08/19 0700 In: 600 [P.O.:600] Out: 500 [Urine:500] Intake/Output this shift: No intake/output data recorded.  General appearance: alert, cooperative, and no distress Heart: regular rate and rhythm, S1, S2 normal, no murmur, click, rub or gallop Lungs: clear to auscultation bilaterally Abdomen: soft, non-tender; bowel sounds normal; no masses,  no organomegaly Extremities: extremities normal, atraumatic, no cyanosis or edema Wound: clean and dry  Lab Results: Recent Labs    02/18/21 0401 02/19/21 0218  WBC 14.2* 12.4*  HGB 11.9* 11.8*  HCT 36.5* 34.8*  PLT 135* 141*   BMET:  Recent Labs    02/18/21 0401 02/19/21 0218  NA 135 135  K 4.0 3.4*  CL 102 96*  CO2 29 30  GLUCOSE 96 97  BUN 16 12  CREATININE 0.72 0.61  CALCIUM 8.5* 8.4*    PT/INR: No results for input(s): LABPROT, INR in the last 72 hours. ABG    Component Value Date/Time   PHART 7.292 (L) 02/16/2021 1632   HCO3 23.3 02/16/2021 1632   TCO2 25 02/16/2021 1632   ACIDBASEDEF 4.0 (H) 02/16/2021 1632   O2SAT 92.0 02/16/2021 1632   CBG (last 3)  Recent Labs    02/19/21 2046 02/19/21 2351  02/20/21 0357  GLUCAP 114* 93 97    Assessment/Plan: S/P Procedure(s) (LRB): CORONARY ARTERY BYPASS GRAFTING (CABG)x 2 ON CARDIOPULMONARY BYPASS USING RIGHT GSV AND LIMA. (N/A) TRANSESOPHAGEAL ECHOCARDIOGRAM (TEE) (N/A) ENDOVEIN HARVEST OF GREATER SAPHENOUS VEIN (Right) APPLICATION OF CELL SAVER  CV-NSR to ST in the low 100s, BP well controlled. Continue asa, statin, and BB. Increase metoprolol to '25mg'$  BID. Pulm-CXR yesterday stable with small pleural effusion and left lower lobe atelectasis, pulmonary vascular congestion. Continue IS. Heavy smoker and still requiring 4L [place] Renal-creatinine 0.61, BMP in the AM. Weight remains up a few lbs. H and H -11.8/34.8, expected acute blood loss anemia Endo-blood glucose well controlled  Plan: Will order BMP for tomorrow to check electrolytes, probably home in the next few days. Still working on weaning oxygen and using incentive spirometer.    LOS: 7 days    Elgie Collard 02/20/2021  Continue diuresis. Dispo planning.  Shellia Hartl Bary Leriche

## 2021-02-20 NOTE — Care Management Important Message (Signed)
Important Message  Patient Details  Name: Jesus CARLONE Sr. MRN: [id number] Date of Birth: 1956-03-16   Medicare Important Message Given:  Yes     Shelda Altes 02/20/2021, 9:14 AM

## 2021-02-20 NOTE — Progress Notes (Signed)
SATURATION QUALIFICATIONS: (This note is used to comply with regulatory documentation for home oxygen)  Patient Saturations on Room Air at Rest = 87%  Patient Saturations on Room Air while Ambulating = 87%  Patient Saturations on 2 Liters of oxygen while Ambulating = 90%  Please briefly explain why patient needs home oxygen:  desat on RA walking

## 2021-02-21 LAB — BASIC METABOLIC PANEL
Anion gap: 8 (ref 5–15)
BUN: 9 mg/dL (ref 8–23)
CO2: 26 mmol/L (ref 22–32)
Calcium: 8 mg/dL — ABNORMAL LOW (ref 8.9–10.3)
Chloride: 95 mmol/L — ABNORMAL LOW (ref 98–111)
Creatinine, Ser: 0.58 mg/dL — ABNORMAL LOW (ref 0.61–1.24)
GFR, Estimated: >60 mL/min (ref >60)
Glucose, Bld: 87 mg/dL (ref 70–99)
Potassium: 3.4 mmol/L — ABNORMAL LOW (ref 3.5–5.1)
Sodium: 129 mmol/L — ABNORMAL LOW (ref 135–145)

## 2021-02-21 LAB — GLUCOSE, CAPILLARY
Glucose-Capillary: 101 mg/dL — ABNORMAL HIGH (ref 70–99)
Glucose-Capillary: 101 mg/dL — ABNORMAL HIGH (ref 70–99)
Glucose-Capillary: 99 mg/dL (ref 70–99)
Glucose-Capillary: 139 mg/dL — ABNORMAL HIGH (ref 70–99)

## 2021-02-21 MED ORDER — POTASSIUM CHLORIDE CRYS ER 20 MEQ PO TBCR
30.0000 meq | EXTENDED_RELEASE_TABLET | Freq: Two times a day (BID) | ORAL | Status: AC
  Administered 2021-02-21 (×2): 30 meq via ORAL
  Filled 2021-02-21 (×2): qty 1

## 2021-02-21 NOTE — Progress Notes (Signed)
      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 (N/A) TRANSESOPHAGEAL ECHOCARDIOGRAM (TEE) (N/A) ENDOVEIN HARVEST OF GREATER SAPHENOUS VEIN (Right) APPLICATION OF CELL SAVER Subjective: Feels okay this morning, walked 400 ft yesterday.  Objective: Vital signs in last 24 hours: Temp:  [97.9 F (36.6 C)-98.2 F (36.8 C)] 98 F (36.7 C) (08/20 0245) Pulse Rate:  [86-106] 94 (08/20 0245) Cardiac Rhythm: Normal sinus rhythm (08/19 2025) Resp:  [13-20] 15 (08/20 0245) BP: (116-131)/(78-96) 128/87 (08/20 0245) SpO2:  [94 %-96 %] 96 % (08/20 0245)     Intake/Output from previous day: 08/19 0701 - 08/20 0700 In: 120 [P.O.:120] Out: 1025 [Urine:1025] Intake/Output this shift: No intake/output data recorded.  General appearance: alert, cooperative, and no distress Heart: regular rate and rhythm, S1, S2 normal, no murmur, click, rub or gallop Lungs: clear to auscultation bilaterally Abdomen: soft, non-tender; bowel sounds normal; no masses,  no organomegaly Extremities: extremities normal, atraumatic, no cyanosis or edema Wound: clean and dry  Lab Results: Recent Labs    02/19/21 0218  WBC 12.4*  HGB 11.8*  HCT 34.8*  PLT 141*   BMET:  Recent Labs    02/19/21 0218 02/21/21 0039  NA 135 129*  K 3.4* 3.4*  CL 96* 95*  CO2 30 26  GLUCOSE 97 87  BUN 12 9  CREATININE 0.61 0.58*  CALCIUM 8.4* 8.0*    PT/INR: No results for input(s): LABPROT, INR in the last 72 hours. 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 (N/A) TRANSESOPHAGEAL ECHOCARDIOGRAM (TEE) (N/A) ENDOVEIN HARVEST OF GREATER SAPHENOUS VEIN (Right) APPLICATION OF CELL SAVER  CV-NSR in the 90s, BP well controlled. Continue asa, statin, and BB.  Pulm-tolerating 2L  with good oxygen saturation, continue aggressive pulm toilet Renal-creatinine 0.58, hypokalemia-replace, weight close to baseline H and H -11.8/34.8, expected acute blood loss anemia Endo-blood glucose well controlled GI- + bowel movement  Plan: Continue ambulation in the halls, continue use of incentive spirometer, continue to wean oxygen as able. Replace potassium.    LOS: 8 days    Elgie Collard 02/21/2021

## 2021-02-21 NOTE — Progress Notes (Signed)
CARDIAC REHAB PHASE I   PRE:  Rate/Rhythm: 84 SR  BP:  Supine:   Sitting: 120/83     SaO2: 97% 2L  MODE:  Ambulation: 420 ft   POST:  Rate/Rhythm: 98 SR  BP:  Supine:   Sitting: 136/86    SaO2: 94% 2L  Pt was independent getting out of EOB and standing up to RW using sternal precautions. He ambulated 448f on 2L with complaints of some lightheadedness but no other complaints. He also had mild SOB. Returned pt seated on EOB with sats at 94% on 2L. Pt needed help swinging his legs on the bed to get into lying position and also needed help being pulled up further in the bed. His son was there to help and will also be the one that will be taking care of him once he is discharged. Pt's goals are to wean off of oxygen and is sats are maintaining in upper 90's. Discussed with RN about lowering his O2 to 1L while resting, she agreed. Turned pt's oxygen to 1L while resting. Call bell in reach with son by his bedside.  [id number] JRick Duff MS, ACSM-CEP

## 2021-02-21 NOTE — Progress Notes (Signed)
Mobility Specialist: Progress Note   02/21/21 1435  Mobility  Activity Ambulated in hall  Level of Assistance Contact guard assist, steadying assist  Assistive Device Front wheel walker  Distance Ambulated (ft) 550 ft  Mobility Ambulated with assistance in hallway  Mobility Response Tolerated well  Mobility performed by Mobility specialist  $Mobility charge 1 Mobility   Pre-Mobility on 1 L/min: 90 HR, 120/81 BP, 96% SpO2 During Mobility on 1 L/min: 92% SpO2 Post-Mobility on 1 L/min: 91 HR, 120/81 BP, 94% SpO2  Pt independent with bed mobility and standby assist to stand as well as during ambulation. Pt asx throughout. Pt back to bed after walk per request. Attempted to wean pt to RA but sats dropped to 86% at rest and pt placed back on 1 L/min [place]. Pt has call bell and phone at his side.   [facility] Jesus Davenport Mobility Specialist Mobility Specialist Phone: [phone]

## 2021-02-22 LAB — BASIC METABOLIC PANEL
Anion gap: 8 (ref 5–15)
BUN: 9 mg/dL (ref 8–23)
CO2: 28 mmol/L (ref 22–32)
Calcium: 8.3 mg/dL — ABNORMAL LOW (ref 8.9–10.3)
Chloride: 98 mmol/L (ref 98–111)
Creatinine, Ser: 0.6 mg/dL — ABNORMAL LOW (ref 0.61–1.24)
GFR, Estimated: >60 mL/min (ref >60)
Glucose, Bld: 98 mg/dL (ref 70–99)
Potassium: 3.7 mmol/L (ref 3.5–5.1)
Sodium: 134 mmol/L — ABNORMAL LOW (ref 135–145)

## 2021-02-22 LAB — GLUCOSE, CAPILLARY
Glucose-Capillary: 113 mg/dL — ABNORMAL HIGH (ref 70–99)
Glucose-Capillary: 122 mg/dL — ABNORMAL HIGH (ref 70–99)
Glucose-Capillary: 105 mg/dL — ABNORMAL HIGH (ref 70–99)
Glucose-Capillary: 113 mg/dL — ABNORMAL HIGH (ref 70–99)

## 2021-02-22 MED ORDER — POTASSIUM CHLORIDE 20 MEQ PO PACK
20.0000 meq | PACK | Freq: Once | ORAL | Status: AC
  Administered 2021-02-22: 20 meq via ORAL
  Filled 2021-02-22: qty 1

## 2021-02-22 MED ORDER — POTASSIUM CHLORIDE CRYS ER 20 MEQ PO TBCR: 30.0000 meq | EXTENDED_RELEASE_TABLET | Freq: Once | ORAL | Status: DC

## 2021-02-22 NOTE — Progress Notes (Signed)
Mobility Specialist: Progress Note   02/22/21 1500  Mobility  Activity Ambulated in hall  Level of Assistance Standby assist, set-up cues, supervision of patient - no hands on  Assistive Device Front wheel walker  Distance Ambulated (ft) 450 ft  Mobility Ambulated with assistance in hallway  Mobility Response Tolerated well  Mobility performed by Mobility specialist  $Mobility charge 1 Mobility   Pre-Mobility 0.5 L/min: 91 HR, 132/92 BP, 95% SpO2 During Mobility on RA: 91-92% SpO2 Post-Mobility on RA: 93 HR, 140/92 BP, 92% SpO2  Pt independent with bed mobility as well as to stand. Pt with successful void before ambulation. Pt ambulated on RA and was left on RA after returning to the room, asx throughout. Pt back to bed after walk with call bell and phone at his side.   [facility] Tedra Coppernoll Mobility Specialist Mobility Specialist Phone: [phone]

## 2021-02-22 NOTE — Progress Notes (Signed)
Multiple attempts to wean pt off O2- stays at 86-88% on RA. 90-91% on 1L. Will continue to monitor.  Jaymes Graff, RN

## 2021-02-22 NOTE — Progress Notes (Addendum)
      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 (N/A) TRANSESOPHAGEAL ECHOCARDIOGRAM (TEE) (N/A) ENDOVEIN HARVEST OF GREATER SAPHENOUS VEIN (Right) APPLICATION OF CELL SAVER Subjective: Feels okay this morning, nervous about going home.   Objective: Vital signs in last 24 hours: Temp:  [97.2 F (36.2 C)-98.3 F (36.8 C)] 98.3 F (36.8 C) (08/21 0530) Pulse Rate:  [91-103] 91 (08/20 1334) Cardiac Rhythm: Normal sinus rhythm (08/20 1900) Resp:  [15-20] 19 (08/21 0530) BP: (121-146)/(74-93) 146/93 (08/21 0530) SpO2:  [90 %-96 %] 92 % (08/21 0530) Weight:  [91.9 kg] 91.9 kg (08/21 0545)     Intake/Output from previous day: 08/20 0701 - 08/21 0700 In: 480 [P.O.:480] Out: 1910 [Urine:1910] Intake/Output this shift: No intake/output data recorded.  General appearance: alert, cooperative, and no distress Heart: regular rate and rhythm, S1, S2 normal, no murmur, click, rub or gallop Lungs: clear to auscultation bilaterally Abdomen: soft, non-tender; bowel sounds normal; no masses,  no organomegaly Extremities: extremities normal, atraumatic, no cyanosis or edema Wound: clean and dry  Lab Results: No results for input(s): WBC, HGB, HCT, PLT in the last 72 hours. BMET:  Recent Labs    02/21/21 0039 02/22/21 0126  NA 129* 134*  K 3.4* 3.7  CL 95* 98  CO2 26 28  GLUCOSE 87 98  BUN 9 9  CREATININE 0.58* 0.60*  CALCIUM 8.0* 8.3*    PT/INR: No results for input(s): LABPROT, INR in the last 72 hours. 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 (N/A) TRANSESOPHAGEAL ECHOCARDIOGRAM (TEE) (N/A) ENDOVEIN HARVEST OF GREATER SAPHENOUS VEIN (Right) APPLICATION OF CELL SAVER    CV-NSR in the 90s, BP well controlled. Continue asa, statin, and BB.  Pulm-tolerating 1L [place] with good oxygen saturation, continue aggressive pulm toilet Renal-creatinine 0.60, potassium 3.7-will give one dose of potassium repalcement H and H -11.8/34.8, expected acute blood loss anemia Endo-blood glucose well controlled GI- + bowel movement   Plan: Continue to wean oxygen as tolerated. Encouraged ambulation in the halls TID. Encouraged use of incentive spirometer. Hopeful for discharge tomorrow. Will order a 6 minute walk test to determine need to home oxygen.    LOS: 9 days    Elgie Collard 02/22/2021  Patient seen and examined, agree with above Continues to slowly improve  Remo Lipps C. Roxan Hockey, MD [facility] [phone]

## 2021-02-23 LAB — GLUCOSE, CAPILLARY
Glucose-Capillary: 101 mg/dL — ABNORMAL HIGH (ref 70–99)
Glucose-Capillary: 105 mg/dL — ABNORMAL HIGH (ref 70–99)

## 2021-02-23 MED ORDER — TRAMADOL HCL 50 MG PO TABS: 50.0000 mg | ORAL_TABLET | Freq: Four times a day (QID) | ORAL | 0 refills | Status: DC | PRN

## 2021-02-23 MED ORDER — ASPIRIN 325 MG PO TBEC: 325.0000 mg | DELAYED_RELEASE_TABLET | Freq: Every day | ORAL | 0 refills | Status: DC

## 2021-02-23 MED ORDER — LISINOPRIL 10 MG PO TABS
10.0000 mg | ORAL_TABLET | Freq: Every day | ORAL | Status: DC
  Administered 2021-02-23: 10 mg via ORAL
  Filled 2021-02-23: qty 1

## 2021-02-23 MED ORDER — METOPROLOL TARTRATE 25 MG PO TABS: 25.0000 mg | ORAL_TABLET | Freq: Two times a day (BID) | ORAL | 1 refills | Status: DC

## 2021-02-23 MED ORDER — POTASSIUM CHLORIDE CRYS ER 20 MEQ PO TBCR: 20.0000 meq | EXTENDED_RELEASE_TABLET | Freq: Every day | ORAL | 0 refills | Status: DC

## 2021-02-23 MED ORDER — FUROSEMIDE 40 MG PO TABS: 40.0000 mg | ORAL_TABLET | Freq: Every day | ORAL | 0 refills | Status: DC

## 2021-02-23 MED ORDER — ATORVASTATIN CALCIUM 80 MG PO TABS: 80.0000 mg | ORAL_TABLET | Freq: Every day | ORAL | 1 refills | Status: DC

## 2021-02-23 MED ORDER — LISINOPRIL 10 MG PO TABS: 10.0000 mg | ORAL_TABLET | Freq: Every day | ORAL | 1 refills | Status: DC

## 2021-02-23 MED ORDER — POTASSIUM CHLORIDE CRYS ER 20 MEQ PO TBCR
20.0000 meq | EXTENDED_RELEASE_TABLET | Freq: Every day | ORAL | Status: DC
  Administered 2021-02-23: 20 meq via ORAL
  Filled 2021-02-23: qty 1

## 2021-02-23 MED ORDER — FUROSEMIDE 40 MG PO TABS
40.0000 mg | ORAL_TABLET | Freq: Every day | ORAL | Status: DC
  Administered 2021-02-23: 40 mg via ORAL
  Filled 2021-02-23: qty 1

## 2021-02-23 NOTE — Progress Notes (Signed)
      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 (N/A) TRANSESOPHAGEAL ECHOCARDIOGRAM (TEE) (N/A) ENDOVEIN HARVEST OF GREATER SAPHENOUS VEIN (Right) APPLICATION OF CELL SAVER  Subjective: Patient without complaints this am  Objective: Vital signs in last 24 hours: Temp:  [97.7 F (36.5 C)-98.6 F (37 C)] 98.2 F (36.8 C) (08/22 0420) Pulse Rate:  [79-100] 86 (08/21 2340) Cardiac Rhythm: Normal sinus rhythm (08/21 1955) Resp:  [16-19] 18 (08/22 0420) BP: (126-148)/(82-105) 138/105 (08/22 0420) SpO2:  [90 %-93 %] 92 % (08/22 0420) Weight:  [89.8 kg] 89.8 kg (08/22 0635)  Pre op weight  93 kg (?) Current Weight  02/23/21 89.8 kg      Intake/Output from previous day: 08/21 0701 - 08/22 0700 In: -  Out: 1320 [Urine:1320]   Physical Exam:  Cardiovascular: RRR Pulmonary: Clear to auscultation bilaterally Abdomen: Soft, non tender, bowel sounds present. Extremities: Mild bilateral lower extremity edema. Wounds: Clean and dry.  No erythema or signs of infection.  Lab Results: CBC:No results for input(s): WBC, HGB, HCT, PLT in the last 72 hours. BMET:  Recent Labs    02/21/21 0039 02/22/21 0126  NA 129* 134*  K 3.4* 3.7  CL 95* 98  CO2 26 28  GLUCOSE 87 98  BUN 9 9  CREATININE 0.58* 0.60*  CALCIUM 8.0* 8.3*    PT/INR:  Lab Results  Component Value Date   INR 1.5 (H) 02/16/2021   INR 1.1 02/15/2021   ABG:  INR: Will add last result for INR, ABG once components are confirmed Will add last 4 CBG results once components are confirmed  Assessment/Plan:  1. CV - SR. On Lopressor 25 mg bid. Will restart low dose Lisinopril for better BP control. 2.  Pulmonary - On 0.5 liter of oxygen via [place]. He did not meet criteria for oxygen per mobility specialist documentation yesterday.  Encourage incentive spirometer 3. Volume Overload - Will give Lasix 40 mg daily 4.  Expected post op acute blood loss anemia - Last H and H stable at 11.8 and 34.8 5. DM-CBGs 105/113/101. On Actos pre op so will restart at discharge. Pre op HGA1C 6.3 6. Mild thrombocytopenia-last platelet 141,000 7. Discharge later today  Albin Duckett M ZimmermanPA-C 02/23/2021,6:55 AM

## 2021-02-23 NOTE — Progress Notes (Signed)
Mobility Specialist: Progress Note    02/23/21 1251  Mobility  Activity Ambulated in hall  Level of Assistance Standby assist, set-up cues, supervision of patient - no hands on  Assistive Device Front wheel walker (Rollator chair follow)  Distance Ambulated (ft) 578 ft  Mobility Ambulated with assistance in hallway  Mobility Response Tolerated fair  Mobility performed by Mobility specialist  $Mobility charge 1 Mobility    Pre-Mobility: 85 HR, 131/91 BP, 93% SpO2 During Mobility: 83 HR, 86-93% SpO2 Post-Mobility: 80 HR, 132/81 BP, 92% SpO2  Pt was independent STS. Pt ambulated with one standing break for 2-3 min and one seated break for a minute. Pt c/o SOB with sats as seen above during mobility, recovered quickly. Pt returned to bed with call bell.   [facility] Maat Kafer Mobility Specialist Mobility Specialist Phone: [phone]

## 2021-02-23 NOTE — Progress Notes (Signed)
Nutrition Education Note  RD consulted for nutrition education regarding a Heart Healthy diet.   Lipid Panel     Component Value Date/Time   CHOL 128 02/15/2021 0652   TRIG 70 02/15/2021 0652   HDL 48 02/15/2021 0652   CHOLHDL 2.7 02/15/2021 0652   VLDL 14 02/15/2021 0652   LDLCALC 66 02/15/2021 0652    RD provided "Heart Healthy Nutrition Therapy" handout from the Academy of Nutrition and Dietetics. Reviewed patient's dietary recall. Provided examples on ways to decrease sodium and fat intake in diet. Discouraged intake of processed foods and use of salt shaker. Encouraged fresh fruits and vegetables as well as whole grain sources of carbohydrates to maximize fiber intake. Teach back method used.  Patient denies loss in appetite or unintentional weight loss PTA. Has great appetite s/p surgery. Family requesting more information regarding heart healthy diet. Discussed low sodium food options, low saturated fat food items, and high fiber food options.   Expect great compliance.  Labs and medications reviewed. No further nutrition interventions warranted at this time. RD contact information provided. If additional nutrition issues arise, please re-consult RD.  Mariana Single MS, RD, LDN, CNSC Clinical Nutrition Pager listed in [place]

## 2021-02-23 NOTE — Progress Notes (Signed)
CARDIAC REHAB PHASE I   PRE:  Rate/Rhythm: 81 SR  BP:  Supine:   Sitting: 117/84  Standing:    SaO2: 92% 0.5L  MODE:  Ambulation: 450 ft   POST:  Rate/Rhythm: 92 SR  BP:  Supine: 127/92  Sitting:   Standing:    SaO2: 89-92%RA 0942-1020 Pt stated he would like rolling walker, BSC and tub seat for home use. Notified RN. Pt is on 0.5L oxygen in bed and would like oxygen for home. Walked pt on RA and he did not qualify. Discussed with pt that sas have to be at or below 88%. Pt voiced understanding. Encouraged him to take rest breaks and to catch his breath as needed when walking at home. Walked 450 ft on RA with rolling walker with steady gait. To bed after walk.    Graylon Good, RN BSN  02/23/2021 10:15 AM

## 2021-02-23 NOTE — Care Management Important Message (Signed)
Important Message  Patient Details  Name: Jesus GUEDES Sr. MRN: [id number] Date of Birth: 1956/05/05   Medicare Important Message Given:  Yes     Shelda Altes 02/23/2021, 1:04 PM

## 2021-02-23 NOTE — Progress Notes (Signed)
Discharge instructions given to patient. PIV's removed. Telemetry box removed, CCMD notified. Personal belongings packed up and placed in a bag and taken with patient. 3-in-1, walker and shower stool also taken with patient. Patient in wheelchair taken to vehicle by staff.  Daymon Larsen, RN

## 2021-02-23 NOTE — TOC Progression Note (Addendum)
Transition of Care (TOC) - Progression Note    Patient Details  Name: Jesus CORT Sr. MRN: [id number] Date of Birth: 08/12/1955  Transition of Care [facility]) CM/SW Contact  Zenon Mayo, RN Phone Number: 02/23/2021, 12:30 PM  Clinical Narrative:    NCM notified by Staff RN patient would like rolling walker and 3 n 1 and shower stool.  NCM made referral to [facility] with [employer].  This will be brought up to his room. Patient has no HH needs.        Expected Discharge Plan and Services           Expected Discharge Date: 02/23/21                                     Social Determinants of Health (SDOH) Interventions    Readmission Risk Interventions No flowsheet data found.

## 2021-02-24 DIAGNOSIS — Z48812 Encounter for surgical aftercare following surgery on the circulatory system: Secondary | ICD-10-CM | POA: Diagnosis not present

## 2021-02-25 ENCOUNTER — Other Ambulatory Visit (HOSPITAL_COMMUNITY): Payer: Medicare Other

## 2021-02-25 ENCOUNTER — Telehealth (HOSPITAL_COMMUNITY): Payer: Self-pay

## 2021-02-25 NOTE — Telephone Encounter (Signed)
Pt called and he is interested in the cardiac rehab program. Will contact pt once he is ready to schedule.

## 2021-02-26 NOTE — Telephone Encounter (Signed)
Pt insurance is active and benefits verified through [employer] Co-pay 0, DED $233/$233 met, out of pocket 0/0 met, co-insurance 20%. no pre-authorization required. Passport, 02/26/2021@10 :47am, REF# [id number]   2ndary insurance is active and benefits verified through [facility] Co-pay 0, DED 0/0 met, out of pocket 0/0 met, co-insurance 0. No pre-authorization required.

## 2021-02-27 ENCOUNTER — Telehealth (INDEPENDENT_AMBULATORY_CARE_PROVIDER_SITE_OTHER): Payer: Self-pay | Admitting: Thoracic Surgery (Cardiothoracic Vascular Surgery)

## 2021-02-27 ENCOUNTER — Other Ambulatory Visit: Payer: Self-pay

## 2021-02-27 DIAGNOSIS — Z951 Presence of aortocoronary bypass graft: Secondary | ICD-10-CM

## 2021-02-27 NOTE — Progress Notes (Signed)
     [address]       ,[place] [address]             [phone]       Patient: Home Provider: Office Consent for Telemedicine visit obtained.  Today's visit was completed via a real-time telehealth (see specific modality noted below). The patient/authorized person provided oral consent at the time of the visit to engage in a telemedicine encounter with the present provider at [facility]. The patient/authorized person was informed of the potential benefits, limitations, and risks of telemedicine. The patient/authorized person expressed understanding that the laws that protect confidentiality also apply to telemedicine. The patient/authorized person acknowledged understanding that telemedicine does not provide emergency services and that he or she would need to call 911 or proceed to the nearest hospital for help if such a need arose.   Total time spent in the clinical discussion 10 minutes.  Telehealth Modality: Phone visit (audio only)  I had a telephone visit with Mr. Lisiecki.  Overall doing well.  He states that he has minimal drainage from his lower incision.  Currently is not draining right now he denies any redness around the wound.  He has no difficulty ambulating.  He is scheduled to meet with the cardiology group on September 8.  Told him to give [place] a call on Monday so that we can discuss the status of his incision.  I will see him formally in 1 month with a chest x-ray.  Kohlton Gilpatrick Bary Leriche

## 2021-03-01 LAB — GLUCOSE, CAPILLARY: Glucose-Capillary: <10 mg/dL — CL (ref 70–99)

## 2021-03-02 ENCOUNTER — Telehealth: Payer: Self-pay

## 2021-03-02 NOTE — Telephone Encounter (Signed)
Patient contacted the office this morning stating that Dr. Kipp Brood requested patient send in pictures of his sternal incision. He is s/p CABG x2 with Dr. Kipp Brood. He states that the top of his sternal incision was draining at times, mainly when he pressed it. He states that it is "puffy" and there is a "knot" at the top. The drainage is very minimal, serosanguinous, minimal redness and no odor. Patient took pictures of the incision and send it to the office phone.   Advised patient that the incision looks to be healing nicely and that fluid should be reabsorbed. The "knot" is normal and should decrease with time. Advised to stop expressing the fluid out. Dr. Kipp Brood agreed.  Patient acknowledged receipt and advised to contact the office back if he had any other questions or concerns or changes in his incisions.

## 2021-03-04 ENCOUNTER — Telehealth: Payer: Self-pay | Admitting: Cardiology

## 2021-03-04 NOTE — Telephone Encounter (Signed)
New message:     Patient calling to see if he still need to have the Endoscopy test. Please advise.

## 2021-03-05 ENCOUNTER — Telehealth: Payer: Self-pay | Admitting: *Deleted

## 2021-03-05 NOTE — Telephone Encounter (Signed)
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 He has no GI history with [place]-  Do you want him to have an OV- and if not is this colon 6 weeks post CABG okay with you?  Please advise, Thanks for your time, Marijean Niemann

## 2021-03-06 NOTE — Telephone Encounter (Signed)
Called and poke with patient= patient given information from Dr. Candis Schatz and Osvaldo Angst and patient verbalized understanding and is "okay with that"; patients  PV and procedure appts cancelled and recall placed in Epic;

## 2021-03-12 ENCOUNTER — Encounter: Payer: Self-pay | Admitting: Cardiology

## 2021-03-12 ENCOUNTER — Other Ambulatory Visit: Payer: Self-pay

## 2021-03-12 ENCOUNTER — Ambulatory Visit (INDEPENDENT_AMBULATORY_CARE_PROVIDER_SITE_OTHER): Payer: Medicare Other | Admitting: Cardiology

## 2021-03-12 VITALS — BP 120/64 | HR 86 | Ht 68.0 in | Wt 193.0 lb

## 2021-03-12 DIAGNOSIS — E1169 Type 2 diabetes mellitus with other specified complication: Secondary | ICD-10-CM

## 2021-03-12 DIAGNOSIS — E782 Mixed hyperlipidemia: Secondary | ICD-10-CM

## 2021-03-12 DIAGNOSIS — I10 Essential (primary) hypertension: Secondary | ICD-10-CM

## 2021-03-12 DIAGNOSIS — E785 Hyperlipidemia, unspecified: Secondary | ICD-10-CM

## 2021-03-12 DIAGNOSIS — I77810 Thoracic aortic ectasia: Secondary | ICD-10-CM

## 2021-03-12 DIAGNOSIS — I2510 Atherosclerotic heart disease of native coronary artery without angina pectoris: Secondary | ICD-10-CM | POA: Diagnosis not present

## 2021-03-12 NOTE — Patient Instructions (Signed)
Medication Instructions:  Your physician recommends that you continue on your current medications as directed. Please refer to the Current Medication list given to you today.  *If you need a refill on your cardiac medications before your next appointment, please call your pharmacy*   Lab Work: Your physician recommends that you return for lab work in:  TODAY: BMET, [place], CBC If you have labs (blood work) drawn today and your tests are completely normal, you will receive your results only by: MyChart Message (if you have MyChart) OR A paper copy in the mail If you have any lab test that is abnormal or we need to change your treatment, we will call you to review the results.   Testing/Procedures: None   Follow-Up: At [facility], you and your health needs are our priority.  As part of our continuing mission to provide you with exceptional heart care, we have created designated Provider Care Teams.  These Care Teams include your primary Cardiologist (physician) and Advanced Practice Providers (APPs -  Physician Assistants and Nurse Practitioners) who all work together to provide you with the care you need, when you need it.  We recommend signing up for the patient portal called "MyChart".  Sign up information is provided on this After Visit Summary.  MyChart is used to connect with patients for Virtual Visits (Telemedicine).  Patients are able to view lab/test results, encounter notes, upcoming appointments, etc.  Non-urgent messages can be sent to your provider as well.   To learn more about what you can do with MyChart, go to [email or web address].    Your next appointment:   4 month(s)  The format for your next appointment:   In Person  Provider:    Berniece Salines, DO [address], [place], [place] [address]    Other Instructions

## 2021-03-13 ENCOUNTER — Telehealth: Payer: Self-pay

## 2021-03-13 ENCOUNTER — Encounter: Payer: Self-pay | Admitting: Cardiology

## 2021-03-13 DIAGNOSIS — I77810 Thoracic aortic ectasia: Secondary | ICD-10-CM | POA: Insufficient documentation

## 2021-03-13 DIAGNOSIS — I7121 Aneurysm of the ascending aorta, without rupture: Secondary | ICD-10-CM | POA: Insufficient documentation

## 2021-03-13 DIAGNOSIS — E785 Hyperlipidemia, unspecified: Secondary | ICD-10-CM | POA: Insufficient documentation

## 2021-03-13 HISTORY — DX: Hyperlipidemia, unspecified: E78.5

## 2021-03-13 LAB — BASIC METABOLIC PANEL
BUN/Creatinine Ratio: 21 (ref 10–24)
BUN: 16 mg/dL (ref 8–27)
CO2: 25 mmol/L (ref 20–29)
Calcium: 8.9 mg/dL (ref 8.6–10.2)
Chloride: 93 mmol/L — ABNORMAL LOW (ref 96–106)
Creatinine, Ser: 0.76 mg/dL (ref 0.76–1.27)
Glucose: 94 mg/dL (ref 65–99)
Potassium: 4.4 mmol/L (ref 3.5–5.2)
Sodium: 132 mmol/L — ABNORMAL LOW (ref 134–144)
eGFR: 99 mL/min/{1.73_m2} (ref >59)

## 2021-03-13 LAB — CBC WITH DIFFERENTIAL/PLATELET
Basophils Absolute: 0 10*3/uL (ref 0.0–0.2)
Basos: 0 %
EOS (ABSOLUTE): 0.2 10*3/uL (ref 0.0–0.4)
Eos: 2 %
Hematocrit: 38.5 % (ref 37.5–51.0)
Hemoglobin: 12.7 g/dL — ABNORMAL LOW (ref 13.0–17.7)
Immature Grans (Abs): 0 10*3/uL (ref 0.0–0.1)
Immature Granulocytes: 1 %
Lymphocytes Absolute: 1.4 10*3/uL (ref 0.7–3.1)
Lymphs: 17 %
MCH: 28.8 pg (ref 26.6–33.0)
MCHC: 33 g/dL (ref 31.5–35.7)
MCV: 87 fL (ref 79–97)
Monocytes Absolute: 0.8 10*3/uL (ref 0.1–0.9)
Monocytes: 9 %
Neutrophils Absolute: 5.9 10*3/uL (ref 1.4–7.0)
Neutrophils: 71 %
Platelets: 335 10*3/uL (ref 150–450)
RBC: 4.41 x10E6/uL (ref 4.14–5.80)
RDW: 14.3 % (ref 11.6–15.4)
WBC: 8.3 10*3/uL (ref 3.4–10.8)

## 2021-03-13 LAB — MAGNESIUM: Magnesium: 2.1 mg/dL (ref 1.6–2.3)

## 2021-03-13 NOTE — Telephone Encounter (Signed)
-----   Message from Berniece Salines, DO sent at 03/13/2021  8:54 AM EDT ----- Hemoglobin has improved since her hospitalization.  Other labs stable

## 2021-03-13 NOTE — Telephone Encounter (Signed)
Patient notified

## 2021-03-13 NOTE — Progress Notes (Signed)
Cardiology Office Note:    Date:  03/13/2021   ID:  Jesus Clayman Sr., DOB [birth year], MRN [id number]  PCP:  No primary care provider on file.  Cardiologist:  Berniece Salines, DO  Electrophysiologist:  None   Referring MD: Ronnald Nian, DO   " I am recovering well"  History of Present Illness:    Jesus Davenport. is a 65 y.o. 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  He had a uncomplicated postop hospital stay.  Since his discharge he tells me that he has gradually been doing well.  The only complaint that he has is on the low back pain that he has suffered for for many years.  He is using a walker in the office today as a result of that.  In the interim he had a virtual telephone visit with Dr. Kipp Brood as he was having minimal drainage from his incision site.  But since that time he had no other issues.  He has had home nurse visiting for the last few weeks.   Past Medical History:  Diagnosis Date   Cancer ([place])    Diabetes mellitus without complication ([place])    Hyperlipidemia 03/13/2021    Past Surgical History:  Procedure Laterality Date   APPENDECTOMY     65yo   CORONARY ARTERY BYPASS GRAFT N/A 02/16/2021   Procedure: CORONARY ARTERY BYPASS GRAFTING (CABG)x 2 ON CARDIOPULMONARY BYPASS USING RIGHT GSV AND LIMA.;  Surgeon: Lajuana Matte, MD;  Location: [facility];  Service: Open Heart Surgery;  Laterality: N/A;   ENDOVEIN HARVEST OF GREATER SAPHENOUS VEIN Right 02/16/2021   Procedure: ENDOVEIN HARVEST OF GREATER SAPHENOUS VEIN;  Surgeon: Lajuana Matte, MD;  Location: [facility];  Service: Open Heart Surgery;  Laterality: Right;   MOHS SURGERY     SCC Rt arm   RIGHT/LEFT HEART CATH AND CORONARY ANGIOGRAPHY N/A 02/13/2021   Procedure: RIGHT/LEFT HEART CATH AND CORONARY ANGIOGRAPHY;   Surgeon: Belva Crome, MD;  Location: [facility];  Service: Cardiovascular;  Laterality: N/A;   TEE WITHOUT CARDIOVERSION N/A 02/16/2021   Procedure: TRANSESOPHAGEAL ECHOCARDIOGRAM (TEE);  Surgeon: Lajuana Matte, MD;  Location: [facility];  Service: Open Heart Surgery;  Laterality: N/A;   TONSILLECTOMY      Current Medications: Current Meds  Medication Sig   aspirin EC 325 MG EC tablet Take 1 tablet (325 mg total) by mouth daily.   atorvastatin (LIPITOR) 80 MG tablet Take 1 tablet (80 mg total) by mouth daily.   dutasteride (AVODART) 0.5 MG capsule Take 1 capsule (0.5 mg total) by mouth daily.   lisinopril (ZESTRIL) 10 MG tablet Take 1 tablet (10 mg total) by mouth daily.   metoprolol tartrate (LOPRESSOR) 25 MG tablet Take 1 tablet (25 mg total) by mouth 2 (two) times daily.   pioglitazone (ACTOS) 30 MG tablet Take 1 tablet (30 mg total) by mouth daily.   traMADol (ULTRAM) 50 MG tablet Take 1 tablet (50 mg total) by mouth every 6 (six) hours as needed for moderate pain.     Allergies:   Patient has no known allergies.   Social History   Socioeconomic History   Marital status: Widowed    Spouse name: Not on file   Number of children: Not on file   Years of  education: Not on file   Highest education level: Not on file  Occupational History   Not on file  Tobacco Use   Smoking status: Every Day    Packs/day: 1.00    Types: Cigarettes   Smokeless tobacco: Never  Vaping Use   Vaping Use: Never used  Substance and Sexual Activity   Alcohol use: Yes    Comment: Occas   Drug use: Never   Sexual activity: Not Currently  Other Topics Concern   Not on file  Social History Narrative   Not on file   Social Determinants of Health   Financial Resource Strain: Not on file  Food Insecurity: Not on file  Transportation Needs: Not on file  Physical Activity: Not on file  Stress: Not on file  Social Connections: Not on file     Family History: The patient's family  history includes Arthritis in his mother; Cancer in his father; Hypertension in his father.  ROS:   Review of Systems  Constitution: Negative for decreased appetite, fever and weight gain.  HENT: Negative for congestion, ear discharge, hoarse voice and sore throat.   Eyes: Negative for discharge, redness, vision loss in right eye and visual halos.  Cardiovascular: Negative for chest pain, dyspnea on exertion, leg swelling, orthopnea and palpitations.  Respiratory: Negative for cough, hemoptysis, shortness of breath and snoring.   Endocrine: Negative for heat intolerance and polyphagia.  Hematologic/Lymphatic: Negative for bleeding problem. Does not bruise/bleed easily.  Skin: Negative for flushing, nail changes, rash and suspicious lesions.  Musculoskeletal: Negative for arthritis, joint pain, muscle cramps, myalgias, neck pain and stiffness.  Gastrointestinal: Negative for abdominal pain, bowel incontinence, diarrhea and excessive appetite.  Genitourinary: Negative for decreased libido, genital sores and incomplete emptying.  Neurological: Negative for brief paralysis, focal weakness, headaches and loss of balance.  Psychiatric/Behavioral: Negative for altered mental status, depression and suicidal ideas.  Allergic/Immunologic: Negative for HIV exposure and persistent infections.    EKGs/Labs/Other Studies Reviewed:    The following studies were reviewed today:   EKG: None today  Transthoracic echocardiogram February 13, 2021 IMPRESSIONS     1. Left ventricular ejection fraction, by estimation, is 60 to 65%. The  left ventricle has normal function. The left ventricle has no regional  wall motion abnormalities. Left ventricular diastolic parameters are  consistent with Grade I diastolic  dysfunction (impaired relaxation).   2. Right ventricular systolic function is normal. The right ventricular  size is normal. There is normal pulmonary artery systolic pressure.   3. The mitral  valve is normal in structure. No evidence of mitral valve  regurgitation. No evidence of mitral stenosis.   4. The aortic valve is tricuspid. Aortic valve regurgitation is mild. No  aortic stenosis is present.   5. Aortic dilatation noted. There is moderate dilatation of the ascending  aorta, measuring 45 mm.   6. The inferior vena cava is normal in size with greater than 50%  respiratory variability, suggesting right atrial pressure of 3 mmHg.   Conclusion(s)/Recommendation(s): Recommend gated CTA of aorta to verify 45  mm ascending aorta.   FINDINGS   Left Ventricle: Left ventricular ejection fraction, by estimation, is 60  to 65%. The left ventricle has normal function. The left ventricle has no  regional wall motion abnormalities. The left ventricular internal cavity  size was normal in size. There is   no left ventricular hypertrophy. Left ventricular diastolic parameters  are consistent with Grade I diastolic dysfunction (impaired relaxation).   Right  Ventricle: The right ventricular size is normal. No increase in  right ventricular wall thickness. Right ventricular systolic function is  normal. There is normal pulmonary artery systolic pressure. The tricuspid  regurgitant velocity is 1.95 m/s, and   with an assumed right atrial pressure of 3 mmHg, the estimated right  ventricular systolic pressure is 123XX123 mmHg.   Left Atrium: Left atrial size was normal in size.   Right Atrium: Right atrial size was normal in size.   Pericardium: There is no evidence of pericardial effusion.   Mitral Valve: The mitral valve is normal in structure. No evidence of  mitral valve regurgitation. No evidence of mitral valve stenosis.   Tricuspid Valve: The tricuspid valve is normal in structure. Tricuspid  valve regurgitation is not demonstrated. No evidence of tricuspid  stenosis.   Aortic Valve: The aortic valve is tricuspid. Aortic valve regurgitation is  mild. Aortic regurgitation PHT  measures 508 msec. No aortic stenosis is  present.   Pulmonic Valve: The pulmonic valve was normal in structure. Pulmonic valve  regurgitation is not visualized. No evidence of pulmonic stenosis.   Aorta: Aortic dilatation noted. There is moderate dilatation of the  ascending aorta, measuring 45 mm.   Venous: The inferior vena cava is normal in size with greater than 50%  respiratory variability, suggesting right atrial pressure of 3 mmHg.   IAS/Shunts: No atrial level shunt detected by color flow Doppler.    Left heart catheterization February 13, 2021 Severe ostial LAD eccentric stenosis also partially involving the distal left main.  Slight reduction in LAD antegrade flow.  Lesion has the appearance of plaque rupture with dissection. Left main is large, greater than 6 mm in diameter Circumflex is widely patent Dominant and widely patent Hyperdynamic left ventricle.  LVEF greater than 55%. Dilated aorta   RECOMMENDATIONS:  LIMA to LAD 2D Doppler echocardiogram to assess aortic root size With decreased LAD flow, recommend keeping patient in-house, coordinate with surgeons, and manage accordingly.  Recent Labs: 02/15/2021: ALT 22; B Natriuretic Peptide 34.1; TSH 9.138 03/12/2021: BUN 16; Creatinine, Ser 0.76; Hemoglobin 12.7; Magnesium WILL FOLLOW; Platelets 335; Potassium 4.4; Sodium 132  Recent Lipid Panel    Component Value Date/Time   CHOL 128 02/15/2021 0652   TRIG 70 02/15/2021 0652   HDL 48 02/15/2021 0652   CHOLHDL 2.7 02/15/2021 0652   VLDL 14 02/15/2021 0652   LDLCALC 66 02/15/2021 0652   LDLDIRECT 102.0 06/18/2020 1455    Physical Exam:    VS:  BP 120/64 (BP Location: Right Arm, Patient Position: Sitting, Cuff Size: Normal)   Pulse 86   Ht '5\' 8"'$  (1.727 m)   Wt 193 lb (87.5 kg)   SpO2 97%   BMI 29.35 kg/m     Wt Readings from Last 3 Encounters:  03/12/21 193 lb (87.5 kg)  02/23/21 197 lb 15.6 oz (89.8 kg)  02/06/21 206 lb (93.4 kg)     GEN: Well  nourished, well developed in no acute distress HEENT: Normal NECK: No JVD; No carotid bruits LYMPHATICS: No lymphadenopathy CARDIAC: S1S2 noted,RRR, no murmurs, rubs, gallops RESPIRATORY:  Clear to auscultation without rales, wheezing or rhonchi  ABDOMEN: Soft, non-tender, non-distended, +bowel sounds, no guarding. EXTREMITIES: No edema, No cyanosis, no clubbing MUSCULOSKELETAL:  No deformity  SKIN: Warm and dry NEUROLOGIC:  Alert and oriented x 3, non-focal PSYCHIATRIC:  Normal affect, good insight  ASSESSMENT:    1. Coronary artery disease involving native coronary artery of native heart without angina pectoris  2. Hyperlipidemia, unspecified hyperlipidemia type   3. DM type 2 with diabetic mixed hyperlipidemia ([place])   4. 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 Essential hypertension    PLAN:    Postoperatively he appears to be doing well.  I was able to take a look at his incision there is no drainage no redness no pus it is appears to be clean and dry.  He is happy with his recovery.  He has a planned follow-up with his CT surgery on September 23.  The meantime I have asked the patient to call cardiac rehab and make an appointment that he could fully start post his surgery appointment.  No angina continue his aspirin and atorvastatin.  Hyperlipidemia - continue with current statin medication.  Blood pressure is acceptable, continue with current antihypertensive regimen.  Diabetes-this is being managed by his primary care doctor.  No adjustments for antidiabetic medications were made today.  We will get blood work today for BMP, mag and CBC   The patient is in agreement with the above plan. The patient left the office in stable condition.  The patient will follow up in 3 months or sooner if needed.   Medication Adjustments/Labs and Tests Ordered: Current medicines are reviewed at length with the patient today.  Concerns regarding medicines are outlined above.  Orders Placed  This Encounter  Procedures   CBC with Differential/Platelet   Magnesium   Basic metabolic panel   No orders of the defined types were placed in this encounter.   Patient Instructions  Medication Instructions:  Your physician recommends that you continue on your current medications as directed. Please refer to the Current Medication list given to you today.  *If you need a refill on your cardiac medications before your next appointment, please call your pharmacy*   Lab Work: Your physician recommends that you return for lab work in:  TODAY: BMET, [place], CBC If you have labs (blood work) drawn today and your tests are completely normal, you will receive your results only by: MyChart Message (if you have MyChart) OR A paper copy in the mail If you have any lab test that is abnormal or we need to change your treatment, we will call you to review the results.   Testing/Procedures: None   Follow-Up: At [facility], you and your health needs are our priority.  As part of our continuing mission to provide you with exceptional heart care, we have created designated Provider Care Teams.  These Care Teams include your primary Cardiologist (physician) and Advanced Practice Providers (APPs -  Physician Assistants and Nurse Practitioners) who all work together to provide you with the care you need, when you need it.  We recommend signing up for the patient portal called "MyChart".  Sign up information is provided on this After Visit Summary.  MyChart is used to connect with patients for Virtual Visits (Telemedicine).  Patients are able to view lab/test results, encounter notes, upcoming appointments, etc.  Non-urgent messages can be sent to your provider as well.   To learn more about what you can do with MyChart, go to [email or web address].    Your next appointment:   4 month(s)  The format for your next appointment:   In Person  Provider:    Berniece Salines, DO [address],  [place], [place] [address]    Other Instructions     Adopting a Healthy Lifestyle.  Know what a healthy weight is for you (roughly BMI <25) and aim  to maintain this   Aim for 7+ servings of fruits and vegetables daily   65-80+ fluid ounces of water or unsweet tea for healthy kidneys   Limit to max 1 drink of alcohol per day; avoid smoking/tobacco   Limit animal fats in diet for cholesterol and heart health - choose grass fed whenever available   Avoid highly processed foods, and foods high in saturated/trans fats   Aim for low stress - take time to unwind and care for your mental health   Aim for 150 min of moderate intensity exercise weekly for heart health, and weights twice weekly for bone health   Aim for 7-9 hours of sleep daily   When it comes to diets, agreement about the perfect plan isnt easy to find, even among the experts. Experts at the [place] developed an idea known as the Healthy Eating Plate. Just imagine a plate divided into logical, healthy portions.   The emphasis is on diet quality:   Load up on vegetables and fruits - one-half of your plate: Aim for color and variety, and remember that potatoes dont count.   Go for whole grains - one-quarter of your plate: Whole wheat, barley, wheat berries, quinoa, oats, brown rice, and foods made with them. If you want pasta, go with whole wheat pasta.   Protein power - one-quarter of your plate: Fish, chicken, beans, and nuts are all healthy, versatile protein sources. Limit red meat.   The diet, however, does go beyond the plate, offering a few other suggestions.   Use healthy plant oils, such as olive, canola, soy, corn, sunflower and peanut. Check the labels, and avoid partially hydrogenated oil, which have unhealthy trans fats.   If youre thirsty, drink water. Coffee and tea are good in moderation, but skip sugary drinks and limit milk and dairy products to one or two daily servings.   The  type of carbohydrate in the diet is more important than the amount. Some sources of carbohydrates, such as vegetables, fruits, whole grains, and beans-are healthier than others.   Finally, stay active  Signed, Berniece Salines, DO  03/13/2021 8:45 AM    [place]

## 2021-03-17 ENCOUNTER — Other Ambulatory Visit: Payer: Self-pay | Admitting: Thoracic Surgery (Cardiothoracic Vascular Surgery)

## 2021-03-17 DIAGNOSIS — Z951 Presence of aortocoronary bypass graft: Secondary | ICD-10-CM

## 2021-03-20 ENCOUNTER — Other Ambulatory Visit: Payer: Self-pay

## 2021-03-20 ENCOUNTER — Ambulatory Visit (INDEPENDENT_AMBULATORY_CARE_PROVIDER_SITE_OTHER): Payer: Self-pay | Admitting: Physician Assistant

## 2021-03-20 ENCOUNTER — Ambulatory Visit
Admission: RE | Admit: 2021-03-20 | Discharge: 2021-03-20 | Disposition: A | Discharge status: Home / Self Care | Payer: Medicare Other | Source: Ambulatory Visit | Attending: Thoracic Surgery (Cardiothoracic Vascular Surgery) | Admitting: Thoracic Surgery (Cardiothoracic Vascular Surgery)

## 2021-03-20 VITALS — BP 148/73 | HR 90 | Resp 20 | Ht 68.0 in | Wt 193.0 lb

## 2021-03-20 DIAGNOSIS — Z951 Presence of aortocoronary bypass graft: Secondary | ICD-10-CM

## 2021-03-20 NOTE — Progress Notes (Signed)
[address]       [place] [address]             [phone]    Jesus BENETTI Sr. is a 65 y.o. male patient who underwent a CABG x 2 on 02/16/2021 by Dr. Kipp Brood. His biggest obstacle was weaning his oxygen since he has a history of smoking. He reports for his routine follow-up appointment.   Today, he is doing well about a month out from surgery. He has been ambulating 3 times a day for about 10 minutes. He had not had any issues with his incision other than some scan tissue creating a bump at the top which is typical.    No diagnosis found. Past Medical History:  Diagnosis Date   Cancer ([place])    Diabetes mellitus without complication ([place])    Hyperlipidemia 03/13/2021   No past surgical history pertinent negatives on file.  Scheduled Meds: Current Outpatient Medications on File Prior to Visit  Medication Sig Dispense Refill   aspirin EC 325 MG EC tablet Take 1 tablet (325 mg total) by mouth daily. 30 tablet 0   atorvastatin (LIPITOR) 80 MG tablet Take 1 tablet (80 mg total) by mouth daily. 30 tablet 1   dutasteride (AVODART) 0.5 MG capsule Take 1 capsule (0.5 mg total) by mouth daily. 90 capsule 3   lisinopril (ZESTRIL) 10 MG tablet Take 1 tablet (10 mg total) by mouth daily. 30 tablet 1   metoprolol tartrate (LOPRESSOR) 25 MG tablet Take 1 tablet (25 mg total) by mouth 2 (two) times daily. 60 tablet 1   pioglitazone (ACTOS) 30 MG tablet Take 1 tablet (30 mg total) by mouth daily. 90 tablet 3   traMADol (ULTRAM) 50 MG tablet Take 1 tablet (50 mg total) by mouth every 6 (six) hours as needed for moderate pain. 30 tablet 0   No current facility-administered medications on file prior to visit.     No Known Allergies Active Problems:   * No active hospital problems. * Vitals:   03/20/21 1038  BP: (!) 148/73  Pulse: 90  Resp: 20  SpO2: 97%    Physical Exam:   Cor: NSR, no murmur Pulm: CTA bilaterally and in all fields Abd: no tenderness, + bowel  sounds Wound: healing well, c/d/I, stable to palpation Ext: no edema  CLINICAL DATA:  Post CABG   EXAM: CHEST - 2 VIEW   COMPARISON:  02/18/2021   FINDINGS: Prior CABG. Heart is normal size. Blunting of the left costophrenic angle could reflect scarring or small left pleural effusion. Lungs are clear. No acute bony abnormality.   IMPRESSION: Small left pleural effusion versus pleural thickening/scarring. No active disease.     Electronically Signed   By: Rolm Baptise M.D.   On: 03/20/2021 10:34   Assessment & Plan  Overall, I think he is doing well. He has been walking without shortness of breath. His pain has been well controlled and since he is no longer requiring any narcotic pain medication, he can be cleared to drive. He is encourage to drive short distances and only during the day for the first few weeks. He does have to fly for work and I suggested waiting until the 3 month mark. I think it would be safe to wait 3 months for the other procedures he mentioned today but I did ask him to run it by Cardiology as well. We have re-sent a cardiac rehab referral even though one  was already made since he hasn't heard from them. They are behind schedule. I do not think that he needs to come back to see [place] unless an issue comes up. We did review the CXR and I think the pleural effusion noted is so small it should resolve on its own if he continues with his activity.   Future refills will be filled by Cardiology.   No formal follow-up needed.   Elgie Collard 03/20/2021

## 2021-03-23 ENCOUNTER — Telehealth: Payer: Self-pay | Admitting: Family Medicine

## 2021-03-23 ENCOUNTER — Telehealth: Payer: Self-pay | Admitting: Cardiology

## 2021-03-23 DIAGNOSIS — I10 Essential (primary) hypertension: Secondary | ICD-10-CM

## 2021-03-23 NOTE — Telephone Encounter (Signed)
Pt c/o medication issue:  1. Name of Medication:   2. How are you currently taking this medication (dosage and times per day)?   3. Are you having a reaction (difficulty breathing--STAT)? no  4. What is your medication issue? Calling to discuss his medication with the nurse

## 2021-03-23 NOTE — Telephone Encounter (Signed)
Spoke to patient VIA phone, he states that his blood sugars have been 80-110 and has lost 20 lbs since his bi-pass surgery 1 month ago.   Advised him that his lastA1C ws done 02/15/21 and with his sugars being in the normal range that staying on the Glipizide is okay.   Also advise to continue to check his sugars and if he finds that they drop below 70 or he feels weird/light-headed than he needs to eat some crackers and drink OJ or soda.    He will call us if he needs anything before his upcoming appointment. Dm/cma

## 2021-03-23 NOTE — Telephone Encounter (Signed)
Called patient to discuss his medications.  - Asprin 325 mg: "I've been taking it for 6 week, question is can I drop to 81 mg Aspirin? - If switched should I take it at night with my metoprolol? - Atorvastatin 80 mg:  Do I need to refill that or will it be changed back to 40  mg?  - Lisinopril 10 mg:  Have 40 mg at home from PCP. Take that os the 10 mg?  - Metoprolol 25 mg: refill or stop taking? - Diclofenac (Voltaren) 50 mg  once daily. Can I take it or no? "I was taking it before I went into surgery, should I just check with my PCP?" - 3 months from surgery or from discharge? Cataracts, Colonoscopy, dental procedures were put on hold. (3 months from surgery or discharge)  Pt made aware Dr. Harriet Masson is out of office today and we will get back to him as soon as we can. He verbalized understanding. No further questions or concerns expressed at this time.

## 2021-03-23 NOTE — Telephone Encounter (Signed)
Pt is a former Dr. Loletha Grayer pt and has a TOC with Dr. Gena Fray on 04/27/21. He has had a double bypass heart surgery from a referral Dr. Gena Fray gave him. He has lost about 20lbs since the  last time he was seen here and his blood sugar is running between 80-100. He is wondering if he should stay on pioglitazone (ACTOS) 30 MG tablet [[id number]]. Should he come in and have an A1C test? Please advise pt at [phone].

## 2021-03-24 MED ORDER — METOPROLOL TARTRATE 25 MG PO TABS: 25.0000 mg | ORAL_TABLET | Freq: Two times a day (BID) | ORAL | 3 refills | Status: DC

## 2021-03-24 MED ORDER — ATORVASTATIN CALCIUM 80 MG PO TABS: 80.0000 mg | ORAL_TABLET | Freq: Every day | ORAL | 3 refills | Status: DC

## 2021-03-24 MED ORDER — LISINOPRIL 10 MG PO TABS: 10.0000 mg | ORAL_TABLET | Freq: Every day | ORAL | 3 refills | Status: DC

## 2021-03-24 NOTE — Addendum Note (Signed)
Addended by: Resa Miner I on: 03/24/2021 11:18 AM   Modules accepted: Orders

## 2021-03-24 NOTE — Telephone Encounter (Signed)
Spoke to the patient just now and let him know Dr. Terrial Rhodes recommendations. He verbalizes understanding and thanks me for calling him back.

## 2021-03-24 NOTE — Telephone Encounter (Signed)
Patient would like to know if he needs to continue with the Aspirin 325 mg daily? He wanted to know if he can drop it back down to Aspirin 81 mg daily.   He also wants to know if he needs to wait three months until having his cataract surgery, colonoscopy, and dental procedures

## 2021-03-27 ENCOUNTER — Ambulatory Visit: Payer: Medicare Other | Admitting: Thoracic Surgery (Cardiothoracic Vascular Surgery)

## 2021-03-31 ENCOUNTER — Encounter: Payer: Medicare Other | Admitting: Gastroenterology

## 2021-04-01 ENCOUNTER — Encounter: Payer: Self-pay | Admitting: Family Medicine

## 2021-04-01 ENCOUNTER — Telehealth: Payer: Self-pay | Admitting: Family Medicine

## 2021-04-01 NOTE — Telephone Encounter (Signed)
Spoke to patient and he will send through my chart pictures of lower leg/ankle swelling. Then will forward message to provider. Dm/cma

## 2021-04-01 NOTE — Telephone Encounter (Signed)
Pt requesting to speak to Dr Rudds nurse, and wouldnt really give me any info other than he has a question. Callback [phone]

## 2021-04-02 ENCOUNTER — Other Ambulatory Visit: Payer: Self-pay | Admitting: Physician Assistant

## 2021-04-02 DIAGNOSIS — I10 Essential (primary) hypertension: Secondary | ICD-10-CM

## 2021-04-02 NOTE — Telephone Encounter (Signed)
Patient scheduled for 04/10/21 @ 8:00am.. Dm/cma

## 2021-04-06 ENCOUNTER — Other Ambulatory Visit: Payer: Self-pay

## 2021-04-06 ENCOUNTER — Encounter: Payer: Self-pay | Admitting: Family Medicine

## 2021-04-06 ENCOUNTER — Ambulatory Visit (INDEPENDENT_AMBULATORY_CARE_PROVIDER_SITE_OTHER): Payer: Medicare Other | Admitting: Family Medicine

## 2021-04-06 ENCOUNTER — Telehealth: Payer: Self-pay

## 2021-04-06 VITALS — BP 120/70 | HR 73 | Temp 97.4°F | Ht 68.0 in | Wt 200.2 lb

## 2021-04-06 DIAGNOSIS — R600 Localized edema: Secondary | ICD-10-CM

## 2021-04-06 DIAGNOSIS — Z23 Encounter for immunization: Secondary | ICD-10-CM

## 2021-04-06 DIAGNOSIS — I7121 Aneurysm of the ascending aorta, without rupture: Secondary | ICD-10-CM

## 2021-04-06 DIAGNOSIS — E1169 Type 2 diabetes mellitus with other specified complication: Secondary | ICD-10-CM | POA: Diagnosis not present

## 2021-04-06 DIAGNOSIS — E782 Mixed hyperlipidemia: Secondary | ICD-10-CM

## 2021-04-06 DIAGNOSIS — I2510 Atherosclerotic heart disease of native coronary artery without angina pectoris: Secondary | ICD-10-CM

## 2021-04-06 DIAGNOSIS — K760 Fatty (change of) liver, not elsewhere classified: Secondary | ICD-10-CM | POA: Insufficient documentation

## 2021-04-06 MED ORDER — EMPAGLIFLOZIN 10 MG PO TABS: 10.0000 mg | ORAL_TABLET | Freq: Every day | ORAL | 2 refills | Status: DC

## 2021-04-06 MED ORDER — ASPIRIN 81 MG PO TBEC
81.0000 mg | DELAYED_RELEASE_TABLET | Freq: Every day | ORAL | 3 refills | Status: AC
Start: 2021-04-06 — End: ?

## 2021-04-06 NOTE — Telephone Encounter (Signed)
Patient called and stated that the Jardiance was going to cost him $440.  Called and spoke to pharmacy, was advised that he has a deductible and that is why its $440, next time it should be less.   Is there anything else that can be sent in for him that will cost less? Please review and advise.  Thanks.  Dm/cma

## 2021-04-06 NOTE — Progress Notes (Signed)
[id number]

## 2021-04-06 NOTE — Progress Notes (Signed)
[facility] [address] [place] [address] Dept: [phone] Dept Fax: [phone]  Office Visit  Subjective:    Patient ID: Jesus Clayman Sr., male    DOB: 04/13/1956, 65 y.o..   MRN: [id number]  Chief Complaint  Patient presents with   Follow-up    F/u leg swelling.      History of Present Illness:  Patient is in today for evaluation of ongoing pedal edema issues. I had seen Jesus Davenport in early July with this same complaint. He had had about 3 weeks of swelling associated with some dyspnea. A 12-lead EKG performed by EMS around that time showed a RBBB pattern, with a left anterior fascicular block. Jesus Davenport was referred to cardiology. He had a cardiac cath that showed a 95% obstruction of the ostial LAD. He underwent a 2-vessel CABG on 8/15. Jesus Davenport is doing quite well with his recovery. He notes that he is having less dyspnea and is increasing in his physical activity. He has noted some ongoing pedal edema. This does fluctuate at times.  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   Unstable angina ([place]) 02/14/2021   Acute on chronic combined systolic and diastolic CHF (congestive heart failure) ([place]) 02/13/2021   CAD (coronary artery disease) 02/13/2021   Cancer ([place]) 01/26/2021   Diabetes mellitus without complication ([place]) 74/06/8785   Subclinical hypothyroidism 01/23/2021   DM type 2 with diabetic mixed hyperlipidemia ([place]) 11/22/2019   Essential hypertension 11/22/2019   BPH with elevated PSA 11/22/2019   Squamous cell carcinoma of arm, 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   Cervical arthritis 11/22/2019   DDD (degenerative disc disease), lumbar 11/22/2019   Past Surgical History:  Procedure Laterality Date   APPENDECTOMY     65yo   CORONARY ARTERY BYPASS GRAFT N/A 02/16/2021   Procedure:  CORONARY ARTERY BYPASS GRAFTING (CABG)x 2 ON CARDIOPULMONARY BYPASS USING RIGHT GSV AND LIMA.;  Surgeon: Lajuana Matte, MD;  Location: [facility];  Service: Open Heart Surgery;  Laterality: N/A;   ENDOVEIN HARVEST OF GREATER SAPHENOUS VEIN Right 02/16/2021   Procedure: ENDOVEIN HARVEST OF GREATER SAPHENOUS VEIN;  Surgeon: Lajuana Matte, MD;  Location: [facility];  Service: Open Heart Surgery;  Laterality: Right;   MOHS SURGERY     SCC Rt arm   RIGHT/LEFT HEART CATH AND CORONARY ANGIOGRAPHY N/A 02/13/2021   Procedure: RIGHT/LEFT HEART CATH AND CORONARY ANGIOGRAPHY;  Surgeon: Belva Crome, MD;  Location: [facility];  Service: Cardiovascular;  Laterality: N/A;   TEE WITHOUT CARDIOVERSION N/A 02/16/2021   Procedure: TRANSESOPHAGEAL ECHOCARDIOGRAM (TEE);  Surgeon: Lajuana Matte, MD;  Location: [facility];  Service: Open Heart Surgery;  Laterality: N/A;   TONSILLECTOMY     Family History  Problem Relation Age of Onset   Arthritis Mother    Cancer Father    Hypertension Father    Outpatient Medications Prior to Visit  Medication Sig Dispense Refill   atorvastatin (LIPITOR) 80 MG tablet Take 1 tablet (80 mg total) by mouth daily. 90 tablet 3   dutasteride (AVODART) 0.5 MG capsule Take 1 capsule (0.5 mg total) by mouth daily. 90 capsule 3   lisinopril (ZESTRIL) 10 MG tablet Take 1 tablet (10 mg total) by mouth daily. 90 tablet 3   metoprolol tartrate (LOPRESSOR) 25 MG tablet Take 1 tablet (25 mg total)  by mouth 2 (two) times daily. 180 tablet 3   aspirin EC 325 MG EC tablet Take 1 tablet (325 mg total) by mouth daily. (Patient taking differently: Take 81 mg by mouth daily.) 30 tablet 0   pioglitazone (ACTOS) 30 MG tablet Take 1 tablet (30 mg total) by mouth daily. 90 tablet 3   No facility-administered medications prior to visit.   No Known Allergies    Objective:   Today's Vitals   04/06/21 1127  BP: 120/70  Pulse: 73  Temp: (!) 97.4 F (36.3 C)  TempSrc: Temporal  SpO2:  98%  Weight: 200 lb 3.2 oz (90.8 kg)  Height: 5\' 8"  (1.727 m)   Body mass index is 30.44 kg/m.   General: Well developed, well nourished. No acute distress. Chest: Vertical scar in mid chest is well healed. Extremities: Trace to 1+ pitting edema noted. Psych: Alert and oriented. Normal mood and affect.  Health Maintenance Due  Topic Date Due   FOOT EXAM  Never done   OPHTHALMOLOGY EXAM  Never done   Hepatitis C Screening  Never done   TETANUS/TDAP  Never done   COLONOSCOPY (Pts 45-22yrs Insurance coverage will need to be confirmed)  Never done   Zoster Vaccines- Shingrix (2 of 2) 01/24/2020   COVID-19 Vaccine (4 - Booster for Moderna series) 08/26/2020   INFLUENZA VACCINE  Never done   Imaging: Echocardiogram (02/13/2021) IMPRESSIONS   1. Left ventricular ejection fraction, by estimation, is 60 to 65%. The left ventricle has normal function. The left ventricle has no regional wall motion abnormalities. Left ventricular diastolic parameters are  consistent with Grade I diastolic dysfunction (impaired relaxation).   2. Right ventricular systolic function is normal. The right ventricular size is normal. There is normal pulmonary artery systolic pressure.   3. The mitral valve is normal in structure. No evidence of mitral valve regurgitation. No evidence of mitral stenosis.   4. The aortic valve is tricuspid. Aortic valve regurgitation is mild. No aortic stenosis is present.   5. Aortic dilatation noted. There is moderate dilatation of the ascending aorta, measuring 45 mm.   6. The inferior vena cava is normal in size with greater than 50% respiratory variability, suggesting right atrial pressure of 3 mmHg.   CT Angio (02/14/2021) IMPRESSION: 1. 4.5 cm ascending thoracic aortic aneurysm. Recommend semi-annual imaging followup by CTA or MRA and referral to cardiothoracic surgery if not already obtained. This recommendation follows 2010 ACCF/AHA/AATS/ACR/ASA/SCA/SCAI/SIR/STS/SVM  Guidelines for the Diagnosis and Management of Patients With Thoracic Aortic Disease. Circulation. 2010; 121: W263-Z858 2. Coronary calcifications. The severity of coronary artery disease and any potential stenosis cannot be assessed on this non-gated CT examination. Assessment for potential risk factor modification, dietary therapy or pharmacologic therapy may be warranted, if clinically indicated. 3. Fatty liver.   Assessment & Plan:   1. Coronary artery disease involving native coronary artery of native heart without angina pectoris Mr. Hereford is s/p recent 2 vessel CABG. Doing better at this point. He will continue metoprolol and aspirin.  - aspirin 81 MG EC tablet; Take 1 tablet (81 mg total) by mouth daily.  Dispense: 90 tablet; Refill: 3  2. Aneurysm of ascending aorta without rupture Incidental finding of mild ascending aortic aneurysm on echocardiogram and CTA. He needs to have this monitored with a repeat CTA in 6 months.   3. DM type 2 with diabetic mixed hyperlipidemia [facility]) Mr. Inabinet diabetes has been improved on pioglitazone. However, timing wise, this is the likely etiology  of his  pedal edema. I recommend we stop the pioglitazone and start Mr. Dart on a SGLT2 inhibitor (in light of his co-occurring diastolic dysfunction).  - empagliflozin (JARDIANCE) 10 MG TABS tablet; Take 1 tablet (10 mg total) by mouth daily before breakfast.  Dispense: 30 tablet; Refill: 2  4. Pedal edema As above.  5. Need for influenza vaccination  - Flu Vaccine QUAD High Dose(Fluad)   Haydee Salter, MD

## 2021-04-07 ENCOUNTER — Ambulatory Visit: Payer: Self-pay

## 2021-04-07 ENCOUNTER — Telehealth (HOSPITAL_COMMUNITY): Payer: Self-pay

## 2021-04-07 ENCOUNTER — Encounter: Payer: Self-pay | Admitting: Family Medicine

## 2021-04-07 DIAGNOSIS — E1169 Type 2 diabetes mellitus with other specified complication: Secondary | ICD-10-CM

## 2021-04-07 NOTE — Telephone Encounter (Signed)
Dr. Gena Fray,  I will have my team try and see if we can help Jesus Davenport qualify for patient assistance for his Jardiance.   Thanks, Junius Argyle, PharmD, Para March, CPP Clinical Pharmacist [facility] at [facility]  [phone]

## 2021-04-07 NOTE — Telephone Encounter (Signed)
Please see other message regarding medication. Dm/cma

## 2021-04-07 NOTE — Progress Notes (Signed)
Spoke with patient regarding patient assistance for Jardiance. Patient income likely too high to qualify for assistance. Reviewing patient's insurance, patient has an expected annual cost on Jardiance of $2086.84. Cost for 2023 is as follows:   January 528.30  February 134.40  March 134.40  April 134.40  May 134.40  June 134.40  July 134.40  August 141.22  September 152.73  October 152.73  November 152.73  December 152.73  Total 2086.84   Patient is concerned this will not be affordable for him and wants to know if there is an alternative agent he could try. Vania Rea is preferred SGLT2 under [facility] formulary, as are other agents like Ozempic and Trulicity.   Patient to reach out to [facility] counselors to see if better insurance plan available to help with cost of medications. Provided contact information to patient in case he needs to reach out again for assistance.   Junius Argyle, PharmD, Para [place], CPP Clinical Pharmacist [facility] at [facility]  [phone]

## 2021-04-07 NOTE — Telephone Encounter (Signed)
Lft VM to rtn call. Dm/cma  

## 2021-04-07 NOTE — Telephone Encounter (Signed)
Called patient to see if he was interested in participating in the Cardiac Rehab Program. Patient stated yes. Patient will come in for orientation on 04/30/2021@9 :00am and will attend the 10:45am exercise class.   Mailed package

## 2021-04-10 ENCOUNTER — Ambulatory Visit: Payer: Medicare Other | Admitting: Family Medicine

## 2021-04-24 ENCOUNTER — Other Ambulatory Visit: Payer: Self-pay

## 2021-04-27 ENCOUNTER — Ambulatory Visit (INDEPENDENT_AMBULATORY_CARE_PROVIDER_SITE_OTHER): Payer: Medicare Other | Admitting: Family Medicine

## 2021-04-27 ENCOUNTER — Encounter: Payer: Self-pay | Admitting: Family Medicine

## 2021-04-27 ENCOUNTER — Other Ambulatory Visit: Payer: Self-pay

## 2021-04-27 VITALS — BP 140/82 | HR 75 | Temp 97.5°F | Ht 68.0 in | Wt 193.6 lb

## 2021-04-27 DIAGNOSIS — I10 Essential (primary) hypertension: Secondary | ICD-10-CM

## 2021-04-27 DIAGNOSIS — E785 Hyperlipidemia, unspecified: Secondary | ICD-10-CM

## 2021-04-27 DIAGNOSIS — E119 Type 2 diabetes mellitus without complications: Secondary | ICD-10-CM | POA: Diagnosis not present

## 2021-04-27 DIAGNOSIS — I5043 Acute on chronic combined systolic (congestive) and diastolic (congestive) heart failure: Secondary | ICD-10-CM

## 2021-04-27 DIAGNOSIS — F17200 Nicotine dependence, unspecified, uncomplicated: Secondary | ICD-10-CM | POA: Insufficient documentation

## 2021-04-27 DIAGNOSIS — E038 Other specified hypothyroidism: Secondary | ICD-10-CM

## 2021-04-27 DIAGNOSIS — N400 Enlarged prostate without lower urinary tract symptoms: Secondary | ICD-10-CM

## 2021-04-27 DIAGNOSIS — H269 Unspecified cataract: Secondary | ICD-10-CM | POA: Insufficient documentation

## 2021-04-27 DIAGNOSIS — I2510 Atherosclerotic heart disease of native coronary artery without angina pectoris: Secondary | ICD-10-CM

## 2021-04-27 DIAGNOSIS — R972 Elevated prostate specific antigen [PSA]: Secondary | ICD-10-CM

## 2021-04-27 LAB — T4, FREE: Free T4: 0.77 ng/dL (ref 0.60–1.60)

## 2021-04-27 LAB — TSH: TSH: 2.83 u[IU]/mL (ref 0.35–5.50)

## 2021-04-27 LAB — MICROALBUMIN / CREATININE URINE RATIO
Creatinine,U: 94.3 mg/dL
Microalb Creat Ratio: 2.2 mg/g (ref 0.0–30.0)
Microalb, Ur: 2.1 mg/dL — ABNORMAL HIGH (ref 0.0–1.9)

## 2021-04-27 NOTE — Progress Notes (Signed)
[facility] [address] [place] [address] Dept: [phone] Dept Fax: [phone]  Transfer of Care Office Visit  Subjective:    Patient ID: Jesus CUDWORTH Sr., male    DOB: 05-Jun-1956, 65 y.o..   MRN: [id number]  Chief Complaint  Patient presents with   Establish Care    TOC- 3 month f/u HTN/DM/chol.  Not fasting today.       History of Present Illness:  Patient is in today to establish care. Mr. Lacher was born in [place] ([place] His father was in sales, so the family moved around some during his childhood. He did attend high school at [employer]. He then went to the [employer] for college, though never entered the [employer]. He is a widower (6 years). He has a son and 2 grandsons, living locally. He work for [employer]. Wellsite geologist.  Mr. Masini quit smoking around the time of his CABG in Aug. He notes in the past 2 weeks, he has been back to smoking about 2 cigarettes a day. He drinsk an occasional beer. He denies drug use.   Mr. Tuckey has a history of CAD and underwent a 2-vessel CABG on 8/15. He had some associated CHF. He was also noted to have a 4.5 cm dilated ascending aortic aneurysm. He is working with Dr. Harriet Masson (cardiology). His is on aspirin and metoprolol for his heart. He is also on lisinopril for hypertension and atorvastatin for lipid manage.  Mr. Talavera has a history of Type 2 diabetes. he is currently managed on Jardiance.  Mr. Payer has a history of cataracts. He is scheduled for cataract removal in Nov.  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   Acute on chronic combined systolic and diastolic CHF (congestive heart failure) ([place])  02/13/2021   CAD (coronary artery disease) 02/13/2021   Diabetes mellitus without complication ([place]) 03/00/9233   Subclinical hypothyroidism 01/23/2021   Essential hypertension 11/22/2019   BPH with elevated PSA 11/22/2019   Squamous cell carcinoma of arm, 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   Cervical arthritis 11/22/2019   DDD (degenerative disc disease), lumbar 11/22/2019   Past Surgical History:  Procedure Laterality Date   APPENDECTOMY     65yo   CORONARY ARTERY BYPASS GRAFT N/A 02/16/2021   Procedure: CORONARY ARTERY BYPASS GRAFTING (CABG)x 2 ON CARDIOPULMONARY BYPASS USING RIGHT GSV AND LIMA.;  Surgeon: Lajuana Matte, MD;  Location: [facility];  Service: Open Heart Surgery;  Laterality: N/A;   ENDOVEIN HARVEST OF GREATER SAPHENOUS VEIN Right 02/16/2021   Procedure: ENDOVEIN HARVEST OF GREATER SAPHENOUS VEIN;  Surgeon: Lajuana Matte, MD;  Location: [facility];  Service: Open Heart Surgery;  Laterality: Right;   MOHS SURGERY     SCC Rt arm   RIGHT/LEFT HEART CATH AND CORONARY ANGIOGRAPHY N/A 02/13/2021   Procedure: RIGHT/LEFT HEART CATH AND CORONARY ANGIOGRAPHY;  Surgeon: Belva Crome, MD;  Location: [facility];  Service: Cardiovascular;  Laterality: N/A;   TEE WITHOUT CARDIOVERSION N/A 02/16/2021   Procedure: TRANSESOPHAGEAL ECHOCARDIOGRAM (TEE);  Surgeon: Lajuana Matte, MD;  Location: [facility];  Service: Open Heart Surgery;  Laterality: N/A;   TONSILLECTOMY     Family History  Problem Relation Age of Onset  Arthritis Mother    Cancer Father    Hypertension Father    Diabetes Sister    Stroke Brother    Outpatient Medications Prior to Visit  Medication Sig Dispense Refill   aspirin 81 MG EC tablet Take 1 tablet (81 mg total) by mouth daily. (Patient taking differently: Take 81 mg by mouth every evening.) 90 tablet 3   atorvastatin (LIPITOR) 80 MG tablet Take 1 tablet (80 mg total) by mouth daily. 90 tablet 3   dutasteride (AVODART) 0.5 MG capsule Take 1 capsule (0.5 mg total)  by mouth daily. 90 capsule 3   empagliflozin (JARDIANCE) 10 MG TABS tablet Take 1 tablet (10 mg total) by mouth daily before breakfast. 30 tablet 2   lisinopril (ZESTRIL) 10 MG tablet Take 1 tablet (10 mg total) by mouth daily. 90 tablet 3   metoprolol tartrate (LOPRESSOR) 25 MG tablet Take 1 tablet (25 mg total) by mouth 2 (two) times daily. 180 tablet 3   No facility-administered medications prior to visit.   No Known Allergies    Objective:   Today's Vitals   04/27/21 0857  BP: 140/82  Pulse: 75  Temp: (!) 97.5 F (36.4 C)  TempSrc: Temporal  SpO2: 97%  Weight: 193 lb 9.6 oz (87.8 kg)  Height: 5\' 8"  (1.727 m)   Body mass index is 29.44 kg/m.   General: Well developed, well nourished. No acute distress. Feet: Skin intact. Pulses 2 +. 5.07 monofilament testing normal. Psych: Alert and oriented. Normal mood and affect.  Health Maintenance Due  Topic Date Due   FOOT EXAM  Never done   OPHTHALMOLOGY EXAM  Never done   Hepatitis C Screening  Never done   TETANUS/TDAP  Never done   COLONOSCOPY (Pts 45-68yrs Insurance coverage will need to be confirmed)  Never done   Zoster Vaccines- Shingrix (2 of 2) 01/24/2020   Lab Results Lab Results  Component Value Date   CHOL 128 02/15/2021   HDL 48 02/15/2021   LDLCALC 66 02/15/2021   LDLDIRECT 102.0 06/18/2020   TRIG 70 02/15/2021   CHOLHDL 2.7 02/15/2021   Lab Results  Component Value Date   HGBA1C 6.3 (H) 02/15/2021   Lab Results  Component Value Date   TSH 9.138 (H) 02/15/2021     Assessment & Plan:   1. Coronary artery disease involving native coronary artery of native heart without angina pectoris Two months s/p CABG. Plans to start cardiac rehab. Continue aspirin and metoprolol. He will continue to follow with cardiology.  2. Tobacco use disorder I advised Mr. Santaana that he should quit any further tobacco use. We discussed that there is no known safe level of tobacco use, discussed the potential for increasing  use with time, and the relationship of his use ot his heart disease. I offered assistance with medication should he need it to help him return to abstinence and maintain this longer term. He declines this help today.  3. 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 In the meantime, he should continue his metoprolol use.  4. Essential hypertension Blood pressure is marginally high today. If this persists, we would consider the addition of a diuretic, in light of his prior pedal edema issues.  5. Subclinical hypothyroidism Mr. Wolford had an elevated TSH with a low normal T4 earlier int he summer. During his hospitalization, he had a TSH > 9. I recommend we repeat this and his T4  to reassess for hypothyroidism.  - TSH - T4, free  6. Diabetes mellitus without complication (HCC) Last Y1V is at goal. Mr. Mascio notes the Vania Rea is expensive. He is looking in to alternate insurance coverage that may cover this medication better.  - Microalbumin / creatinine urine ratio  7. Hyperlipidemia, unspecified hyperlipidemia type At goal on high-intensity statin therapy.  8. BPH with elevated PSA Stable on dutasteride.  Haydee Salter, MD

## 2021-04-28 ENCOUNTER — Telehealth (HOSPITAL_COMMUNITY): Payer: Self-pay | Admitting: *Deleted

## 2021-04-28 NOTE — Telephone Encounter (Signed)
Spoke with Laurey Arrow confirmed appointment. Completed health history.Barnet Pall, RN,BSN 04/28/2021 3:33 PM

## 2021-04-30 ENCOUNTER — Encounter (HOSPITAL_COMMUNITY)
Admission: RE | Admit: 2021-04-30 | Discharge: 2021-04-30 | Disposition: A | Discharge status: Home / Self Care | Payer: Medicare Other | Source: Ambulatory Visit | Attending: Cardiology | Admitting: Cardiology

## 2021-04-30 ENCOUNTER — Telehealth (HOSPITAL_COMMUNITY): Payer: Self-pay | Admitting: *Deleted

## 2021-04-30 ENCOUNTER — Other Ambulatory Visit: Payer: Self-pay

## 2021-04-30 ENCOUNTER — Encounter (HOSPITAL_COMMUNITY): Payer: Self-pay

## 2021-04-30 VITALS — BP 128/78 | HR 97 | Ht 68.0 in | Wt 194.0 lb

## 2021-04-30 DIAGNOSIS — Z951 Presence of aortocoronary bypass graft: Secondary | ICD-10-CM | POA: Insufficient documentation

## 2021-04-30 NOTE — Progress Notes (Signed)
Cardiac Rehab Medication Review by a Pharmacist  Does the patient  feel that his/her medications are working for him/her?  yes  Has the patient been experiencing any side effects to the medications prescribed?  no  Does the patient measure his/her own blood pressure or blood glucose at home?  yes   Does the patient have any problems obtaining medications due to transportation or finances?   no  Understanding of regimen: good Understanding of indications: good Potential of compliance: good    Nurse comments: Jesus Davenport is taking his medications as prescribed and says he has a good understanding of what his medications are for. Jesus Davenport says he has to pay a lot for his Jardiance. Jesus Davenport will have an insurance plan where he wont have to pay as much next year. Jesus Davenport has a BP cuff / monitor  and a CBG meter at home. Jesus Davenport does not check his blood pressure on a regular basis. [name] checks his CBG's once to twice a day.     Christa See Kirin Pastorino RN 04/30/2021 9:31 AM

## 2021-04-30 NOTE — Progress Notes (Signed)
Cardiac Individual Treatment Plan  Patient Details  Name: TRACEY STEWART Sr. MRN: [id number] Date of Birth: 07/25/1955 Referring Provider:   Flowsheet Row CARDIAC REHAB PHASE II ORIENTATION from 04/30/2021 in [facility]  Referring Provider Dr. 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 (Patient taking differently: Take 81 mg by mouth every evening.), Disp: 90 tablet, Rfl: 3   atorvastatin (LIPITOR) 80 MG tablet, Take 1 tablet (80 mg total) by mouth daily., Disp: 90 tablet, Rfl: 3   dutasteride (AVODART) 0.5 MG capsule, Take 1 capsule (0.5 mg total) by mouth daily., Disp: 90 capsule, Rfl: 3   empagliflozin (JARDIANCE) 10 MG TABS tablet, Take 1 tablet (10 mg total) by mouth daily before breakfast., Disp: 30 tablet, Rfl: 2   lisinopril (ZESTRIL) 10 MG tablet, Take 1 tablet (10 mg total) by mouth daily., Disp: 90 tablet, Rfl: 3   metoprolol tartrate (LOPRESSOR) 25 MG tablet, Take 1 tablet (25 mg total) by mouth 2 (two) times daily., Disp: 180 tablet, Rfl: 3  Past Medical History: Past Medical History:  Diagnosis Date   Cancer ([place])    Diabetes mellitus without complication ([place])    Hyperlipidemia 03/13/2021    Tobacco Use: Social History   Tobacco Use  Smoking Status Every Day   Packs/day: 1.00   Types: Cigarettes  Smokeless Tobacco Never    Labs: Recent Review Flowsheet Data     Labs for ITP Cardiac and Pulmonary Rehab Latest Ref Rng & Units 02/16/2021 02/16/2021 02/16/2021 02/16/2021 02/16/2021   Cholestrol 0 - 200 mg/dL - - - - -   LDLCALC 0 - 99 mg/dL - - - - -   LDLDIRECT mg/dL - - - - -   HDL >40 mg/dL - - - - -    Trlycerides <150 mg/dL - - - - -   Hemoglobin A1c 4.8 - 5.6 % - - - - -   PHART 7.350 - 7.450 7.355 7.226(L) 7.301(L) 7.326(L) 7.292(L)   PCO2ART 32.0 - 48.0 mmHg 40.1 36.1 27.5(L) 43.4 48.1(H)   HCO3 20.0 - 28.0 mmol/L 22.4 15.1(L) 13.8(L) 22.9 23.3   TCO2 22 - 32 mmol/L 24 16(L) 15(L) 24 25   ACIDBASEDEF 0.0 - 2.0 mmol/L 3.0(H) 12.0(H) 12.0(H) 3.0(H) 4.0(H)   O2SAT % 97.0 90.0 93.0 90.0 92.0       Capillary Blood Glucose: Lab Results  Component Value Date   GLUCAP 105 (H) 02/23/2021   GLUCAP 101 (H) 02/23/2021   GLUCAP 113 (H) 02/22/2021   GLUCAP 105 (H) 02/22/2021   GLUCAP 122 (H) 02/22/2021     Exercise Target Goals: Exercise Program Goal: Individual exercise prescription set using results from initial 6 min walk test and THRR while considering  patient's activity barriers and safety.   Exercise Prescription Goal: Starting with aerobic activity 30 plus minutes a day, 3 days per week for initial exercise prescription. Provide home exercise prescription and guidelines that participant acknowledges understanding prior to discharge.  Activity Barriers & Risk Stratification:  Activity Barriers & Cardiac Risk Stratification - 04/30/21 1119       Activity Barriers & Cardiac Risk  Stratification   Activity Barriers Back Problems    Cardiac Risk Stratification High             6 Minute Walk:  6 Minute Walk     Row Name 04/30/21 1110         6 Minute Walk   Phase Initial     Distance 1254 feet     Walk Time 6 minutes     # of Rest Breaks 0     MPH 2.37     METS 3.1     RPE 11     Perceived Dyspnea  0     VO2 Peak 10.86     Resting HR 97 bpm     Resting BP 128/78     Resting Oxygen Saturation  97 %     Exercise Oxygen Saturation  during 6 min walk 94 %     Max Ex. HR 115 bpm     Max Ex. BP 134/72     2 Minute Post BP 110/72              Oxygen Initial Assessment:   Oxygen Re-Evaluation:   Oxygen Discharge (Final Oxygen  Re-Evaluation):   Initial Exercise Prescription:  Initial Exercise Prescription - 04/30/21 1100       Date of Initial Exercise RX and Referring Provider   Date 04/30/21    Referring Provider Dr. Berniece Salines, DO    Expected Discharge Date 05/03/21      Bike   Level 1.5    Minutes 15    METs 1.9      NuStep   Level 2    SPM 80    Minutes 15    METs 2      Prescription Details   Frequency (times per week) 3    Duration Progress to 30 minutes of continuous aerobic without signs/symptoms of physical distress      Intensity   THRR 40-80% of Max Heartrate 62-123    Ratings of Perceived Exertion 11-13    Perceived Dyspnea 0-4      Progression   Progression Continue progressive overload as per policy without signs/symptoms or physical distress.      Resistance Training   Training Prescription Yes    Weight 3    Reps 10-15             Perform Capillary Blood Glucose checks as needed.  Exercise Prescription Changes:   Exercise Comments:   Exercise Goals and Review:   Exercise Goals     Row Name 04/30/21 1137             Exercise Goals   Increase Physical Activity Yes       Intervention Provide advice, education, support and counseling about physical activity/exercise needs.;Develop an individualized exercise prescription for aerobic and resistive training based on initial evaluation findings, risk stratification, comorbidities and participant's personal goals.       Expected Outcomes Short Term: Attend rehab on a regular basis to increase amount of physical activity.;Long Term: Add in home exercise to make exercise part of routine and to increase amount of physical activity.;Long Term: Exercising regularly at least 3-5 days a week.       Increase Strength and Stamina Yes       Intervention Provide advice, education, support and counseling about physical activity/exercise needs.;Develop an individualized exercise prescription for aerobic and resistive training  based on initial evaluation findings, risk stratification, comorbidities and participant's personal goals.  Expected Outcomes Short Term: Increase workloads from initial exercise prescription for resistance, speed, and METs.;Short Term: Perform resistance training exercises routinely during rehab and add in resistance training at home;Long Term: Improve cardiorespiratory fitness, muscular endurance and strength as measured by increased METs and functional capacity (6MWT)       Able to understand and use rate of perceived exertion (RPE) scale Yes       Intervention Provide education and explanation on how to use RPE scale       Expected Outcomes Short Term: Able to use RPE daily in rehab to express subjective intensity level;Long Term:  Able to use RPE to guide intensity level when exercising independently       Knowledge and understanding of Target Heart Rate Range (THRR) Yes       Intervention Provide education and explanation of THRR including how the numbers were predicted and where they are located for reference       Expected Outcomes Short Term: Able to state/look up THRR;Long Term: Able to use THRR to govern intensity when exercising independently;Short Term: Able to use daily as guideline for intensity in rehab       Understanding of Exercise Prescription Yes       Intervention Provide education, explanation, and written materials on patient's individual exercise prescription       Expected Outcomes Short Term: Able to explain program exercise prescription;Long Term: Able to explain home exercise prescription to exercise independently                Exercise Goals Re-Evaluation :    Discharge Exercise Prescription (Final Exercise Prescription Changes):   Nutrition:  Target Goals: Understanding of nutrition guidelines, daily intake of sodium 1500mg , cholesterol 200mg , calories 30% from fat and 7% or less from saturated fats, daily to have 5 or more servings of fruits and  vegetables.  Biometrics:   Post Biometrics - 04/30/21 1138        Post  Biometrics   Waist Circumference 41 inches    Hip Circumference 40 inches    Waist to Hip Ratio 1.03 %    Triceps Skinfold 27 mm    % Body Fat 31.1 %    Grip Strength 32 kg    Flexibility 5 in    Single Leg Stand 30 seconds             Nutrition Therapy Plan and Nutrition Goals:   Nutrition Assessments:  MEDIFICTS Score Key: ?70 Need to make dietary changes  40-70 Heart Healthy Diet ? 40 Therapeutic Level Cholesterol Diet   Picture Your Plate Scores: <01 Unhealthy dietary pattern with much room for improvement. 41-50 Dietary pattern unlikely to meet recommendations for good health and room for improvement. 51-60 More healthful dietary pattern, with some room for improvement.  >60 Healthy dietary pattern, although there may be some specific behaviors that could be improved.    Nutrition Goals Re-Evaluation:   Nutrition Goals Discharge (Final Nutrition Goals Re-Evaluation):   Psychosocial: Target Goals: Acknowledge presence or absence of significant depression and/or stress, maximize coping skills, provide positive support system. Participant is able to verbalize types and ability to use techniques and skills needed for reducing stress and depression.  Initial Review & Psychosocial Screening:  Initial Psych Review & Screening - 04/30/21 1020       Initial Review   Current issues with None Identified      Family Dynamics   Good Support System? Yes    Comments Laurey Arrow recently moved  here to [facility] to be closer to his son and grandchildren. [name]'s wife passed away 6 years ago      Barriers   Psychosocial barriers to participate in program The patient should benefit from training in stress management and relaxation.      Screening Interventions   Interventions Encouraged to exercise    Expected Outcomes Long Term Goal: Stressors or current issues are controlled or eliminated.              Quality of Life Scores:  Quality of Life - 04/30/21 1147       Quality of Life   Select Quality of Life      Quality of Life Scores   Health/Function Pre 24.5 %    Socioeconomic Pre 25.57 %    Psych/Spiritual Pre 26 %    Family Pre 24 %    GLOBAL Pre 24.96 %            Scores of 19 and below usually indicate a poorer quality of life in these areas.  A difference of  2-3 points is a clinically meaningful difference.  A difference of 2-3 points in the total score of the Quality of Life Index has been associated with significant improvement in overall quality of life, self-image, physical symptoms, and general health in studies assessing change in quality of life.  PHQ-9: Recent Review Flowsheet Data     Depression screen [facility] 2/9 04/30/2021 06/18/2020   Decreased Interest 0 1   Down, Depressed, Hopeless 0 1   PHQ - 2 Score 0 2   Altered sleeping - 0   Tired, decreased energy - 2   Change in appetite - 0   Trouble concentrating - 0   Moving slowly or fidgety/restless - 0   Suicidal thoughts - 0   PHQ-9 Score - 4   Difficult doing work/chores - Not difficult at all      Interpretation of Total Score  Total Score Depression Severity:  1-4 = Minimal depression, 5-9 = Mild depression, 10-14 = Moderate depression, 15-19 = Moderately severe depression, 20-27 = Severe depression   Psychosocial Evaluation and Intervention:   Psychosocial Re-Evaluation:   Psychosocial Discharge (Final Psychosocial Re-Evaluation):   Vocational Rehabilitation: Provide vocational rehab assistance to qualifying candidates.   Vocational Rehab Evaluation & Intervention:  Vocational Rehab - 04/30/21 1024       Initial Vocational Rehab Evaluation & Intervention   Assessment shows need for Vocational Rehabilitation No      Vocational Rehab Re-Evaulation   Comments Laurey Arrow works full time and does not need vocational rehab at this time             Education: Education Goals:  Education classes will be provided on a weekly basis, covering required topics. Participant will state understanding/return demonstration of topics presented.  Learning Barriers/Preferences:  Learning Barriers/Preferences - 04/30/21 1154       Learning Barriers/Preferences   Learning Barriers Sight    Learning Preferences Group Instruction;Individual Instruction;Skilled Demonstration;Pictoral;Video;Written Material             Education Topics: Hypertension, Hypertension Reduction -Define heart disease and high blood pressure. Discus how high blood pressure affects the body and ways to reduce high blood pressure.   Exercise and Your Heart -Discuss why it is important to exercise, the FITT principles of exercise, normal and abnormal responses to exercise, and how to exercise safely.   Angina -Discuss definition of angina, causes of angina, treatment of angina, and how to  decrease risk of having angina.   Cardiac Medications -Review what the following cardiac medications are used for, how they affect the body, and side effects that may occur when taking the medications.  Medications include Aspirin, Beta blockers, calcium channel blockers, ACE Inhibitors, angiotensin receptor blockers, diuretics, digoxin, and antihyperlipidemics.   Congestive Heart Failure -Discuss the definition of CHF, how to live with CHF, the signs and symptoms of CHF, and how keep track of weight and sodium intake.   Heart Disease and Intimacy -Discus the effect sexual activity has on the heart, how changes occur during intimacy as we age, and safety during sexual activity.   Smoking Cessation / COPD -Discuss different methods to quit smoking, the health benefits of quitting smoking, and the definition of COPD.   Nutrition I: Fats -Discuss the types of cholesterol, what cholesterol does to the heart, and how cholesterol levels can be controlled.   Nutrition II: Labels -Discuss the different  components of food labels and how to read food label   Heart Parts/Heart Disease and PAD -Discuss the anatomy of the heart, the pathway of blood circulation through the heart, and these are affected by heart disease.   Stress I: Signs and Symptoms -Discuss the causes of stress, how stress may lead to anxiety and depression, and ways to limit stress.   Stress II: Relaxation -Discuss different types of relaxation techniques to limit stress.   Warning Signs of Stroke / TIA -Discuss definition of a stroke, what the signs and symptoms are of a stroke, and how to identify when someone is having stroke.   Knowledge Questionnaire Score:  Knowledge Questionnaire Score - 04/30/21 1155       Knowledge Questionnaire Score   Pre Score 25/28             Core Components/Risk Factors/Patient Goals at Admission:  Personal Goals and Risk Factors at Admission - 04/30/21 1158       Core Components/Risk Factors/Patient Goals on Admission    Weight Management Yes;Weight Maintenance;Weight Loss    Intervention Weight Management: Develop a combined nutrition and exercise program designed to reach desired caloric intake, while maintaining appropriate intake of nutrient and fiber, sodium and fats, and appropriate energy expenditure required for the weight goal.;Weight Management: Provide education and appropriate resources to help participant work on and attain dietary goals.    Diabetes Yes    Intervention Provide education about signs/symptoms and action to take for hypo/hyperglycemia.;Provide education about proper nutrition, including hydration, and aerobic/resistive exercise prescription along with prescribed medications to achieve blood glucose in normal ranges: Fasting glucose 65-99 mg/dL    Expected Outcomes Short Term: Participant verbalizes understanding of the signs/symptoms and immediate care of hyper/hypoglycemia, proper foot care and importance of medication, aerobic/resistive exercise  and nutrition plan for blood glucose control.;Long Term: Attainment of HbA1C < 7%.    Hypertension Yes    Intervention Provide education on lifestyle modifcations including regular physical activity/exercise, weight management, moderate sodium restriction and increased consumption of fresh fruit, vegetables, and low fat dairy, alcohol moderation, and smoking cessation.;Monitor prescription use compliance.    Expected Outcomes Short Term: Continued assessment and intervention until BP is < 140/34mm HG in hypertensive participants. < 130/82mm HG in hypertensive participants with diabetes, heart failure or chronic kidney disease.;Long Term: Maintenance of blood pressure at goal levels.    Lipids Yes    Intervention Provide education and support for participant on nutrition & aerobic/resistive exercise along with prescribed medications to achieve LDL 70mg , HDL >40mg .  Expected Outcomes Short Term: Participant states understanding of desired cholesterol values and is compliant with medications prescribed. Participant is following exercise prescription and nutrition guidelines.;Long Term: Cholesterol controlled with medications as prescribed, with individualized exercise RX and with personalized nutrition plan. Value goals: LDL < 70mg , HDL > 40 mg.    Stress Yes    Intervention Offer individual and/or small group education and counseling on adjustment to heart disease, stress management and health-related lifestyle change. Teach and support self-help strategies.;Refer participants experiencing significant psychosocial distress to appropriate mental health specialists for further evaluation and treatment. When possible, include family members and significant others in education/counseling sessions.    Expected Outcomes Short Term: Participant demonstrates changes in health-related behavior, relaxation and other stress management skills, ability to obtain effective social support, and compliance with  psychotropic medications if prescribed.;Long Term: Emotional wellbeing is indicated by absence of clinically significant psychosocial distress or social isolation.    Personal Goal Other Yes    Personal Goal Short term walk further Long term: gain stamina    Intervention Will continue to monitor pt and progress workloads as tolerated without sign or symptom    Expected Outcomes Pt will achieve their goals             Core Components/Risk Factors/Patient Goals Review:    Core Components/Risk Factors/Patient Goals at Discharge (Final Review):    ITP Comments:  ITP Comments     Row Name 04/30/21 0929           ITP Comments Dr Fransico Him MD, Medical Director                Comments: Laurey Arrow attended orientation on 04/30/2021 to review rules and guidelines for program.  Completed 6 minute walk test, Intitial ITP, and exercise prescription.  VSS. Telemetry- Sinus Rhythm.  Asymptomatic. Safety measures and social distancing in place per CDC guidelines. Barnet Pall, RN,BSN 04/30/2021 3:24 PM

## 2021-04-30 NOTE — Telephone Encounter (Signed)
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  Lets make sure that he is not going above systolic of 675V during his exercise.  Thank you for taking care of him. 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  Does this mean that no bp parameters, activity and lifting restrictions are warranted?? 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 Recommend  semi-annual imaging.    Any need for BP parameters to observe for this pt at rest and on exertion?  Any lifting restriction for hand weights or exercises to avoid?  Thanks  Psychologist, clinical, BSN Cardiac and Training and development officer

## 2021-05-05 ENCOUNTER — Telehealth (HOSPITAL_COMMUNITY): Payer: Self-pay | Admitting: *Deleted

## 2021-05-05 ENCOUNTER — Encounter (HOSPITAL_COMMUNITY): Payer: Self-pay | Admitting: *Deleted

## 2021-05-05 DIAGNOSIS — Z951 Presence of aortocoronary bypass graft: Secondary | ICD-10-CM

## 2021-05-05 NOTE — Progress Notes (Signed)
[place] Individual Treatment Plan  Patient Details  Name: Jesus GUZZETTA Sr. MRN: [id number] Date of Birth: [birth year] Referring Provider:   Flowsheet Row CARDIAC REHAB PHASE II ORIENTATION from 04/30/2021 in [place]  Referring Provider Dr. 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 (Patient taking differently: Take 81 mg by mouth every evening.), Disp: 90 tablet, Rfl: 3   atorvastatin (LIPITOR) 80 MG tablet, Take 1 tablet (80 mg total) by mouth daily., Disp: 90 tablet, Rfl: 3   dutasteride (AVODART) 0.5 MG capsule, Take 1 capsule (0.5 mg total) by mouth daily., Disp: 90 capsule, Rfl: 3   empagliflozin (JARDIANCE) 10 MG TABS tablet, Take 1 tablet (10 mg total) by mouth daily before breakfast., Disp: 30 tablet, Rfl: 2   lisinopril (ZESTRIL) 10 MG tablet, Take 1 tablet (10 mg total) by mouth daily., Disp: 90 tablet, Rfl: 3   metoprolol tartrate (LOPRESSOR) 25 MG tablet, Take 1 tablet (25 mg total) by mouth 2 (two) times daily., Disp: 180 tablet, Rfl: 3  Past Medical History: Past Medical History:  Diagnosis Date   Cancer ([place])    Diabetes mellitus without complication ([place])    Hyperlipidemia 03/13/2021    Tobacco Use: Social History   Tobacco Use  Smoking Status Every Day   Packs/day: 1.00   Types: Cigarettes  Smokeless Tobacco Never    Labs: Recent Review Flowsheet Data     Labs for ITP Cardiac and Pulmonary Rehab Latest Ref Rng & Units 02/16/2021 02/16/2021 02/16/2021 02/16/2021 02/16/2021   Cholestrol 0 - 200 mg/dL - - - - -   LDLCALC 0 - 99 mg/dL - - - - -   LDLDIRECT mg/dL - - - - -   HDL >40 mg/dL - - - - -    Trlycerides <150 mg/dL - - - - -   Hemoglobin A1c 4.8 - 5.6 % - - - - -   PHART 7.350 - 7.450 7.355 7.226(L) 7.301(L) 7.326(L) 7.292(L)   PCO2ART 32.0 - 48.0 mmHg 40.1 36.1 27.5(L) 43.4 48.1(H)   HCO3 20.0 - 28.0 mmol/L 22.4 15.1(L) 13.8(L) 22.9 23.3   TCO2 22 - 32 mmol/L 24 16(L) 15(L) 24 25   ACIDBASEDEF 0.0 - 2.0 mmol/L 3.0(H) 12.0(H) 12.0(H) 3.0(H) 4.0(H)   O2SAT % 97.0 90.0 93.0 90.0 92.0       Capillary Blood Glucose: Lab Results  Component Value Date   GLUCAP 105 (H) 02/23/2021   GLUCAP 101 (H) 02/23/2021   GLUCAP 113 (H) 02/22/2021   GLUCAP 105 (H) 02/22/2021   GLUCAP 122 (H) 02/22/2021     Exercise Target Goals: Exercise Program Goal: Individual exercise prescription set using results from initial 6 min walk test and THRR while considering  patient's activity barriers and safety.   Exercise Prescription Goal: Starting with aerobic activity 30 plus minutes a day, 3 days per week for initial exercise prescription. Provide home exercise prescription and guidelines that participant acknowledges understanding prior to discharge.  Activity Barriers & Risk Stratification:  Activity Barriers & Cardiac Risk Stratification - 04/30/21 1119       Activity Barriers & Cardiac Risk  Stratification   Activity Barriers Back Problems    Cardiac Risk Stratification High             6 Minute Walk:  6 Minute Walk     Row Name 04/30/21 1110         6 Minute Walk   Phase Initial     Distance 1254 feet     Walk Time 6 minutes     # of Rest Breaks 0     MPH 2.37     METS 3.1     RPE 11     Perceived Dyspnea  0     VO2 Peak 10.86     Resting HR 97 bpm     Resting BP 128/78     Resting Oxygen Saturation  97 %     Exercise Oxygen Saturation  during 6 min walk 94 %     Max Ex. HR 115 bpm     Max Ex. BP 134/72     2 Minute Post BP 110/72              Oxygen Initial Assessment:   Oxygen Re-Evaluation:   Oxygen Discharge (Final Oxygen  Re-Evaluation):   Initial Exercise Prescription:  Initial Exercise Prescription - 04/30/21 1100       Date of Initial Exercise RX and Referring Provider   Date 04/30/21    Referring Provider Dr. Berniece Salines, DO    Expected Discharge Date 05/03/21      Bike   Level 1.5    Minutes 15    METs 1.9      NuStep   Level 2    SPM 80    Minutes 15    METs 2      Prescription Details   Frequency (times per week) 3    Duration Progress to 30 minutes of continuous aerobic without signs/symptoms of physical distress      Intensity   THRR 40-80% of Max Heartrate 62-123    Ratings of Perceived Exertion 11-13    Perceived Dyspnea 0-4      Progression   Progression Continue progressive overload as per policy without signs/symptoms or physical distress.      Resistance Training   Training Prescription Yes    Weight 3    Reps 10-15             Perform Capillary Blood Glucose checks as needed.  Exercise Prescription Changes:   Exercise Comments:   Exercise Goals and Review:   Exercise Goals     Row Name 04/30/21 1137             Exercise Goals   Increase Physical Activity Yes       Intervention Provide advice, education, support and counseling about physical activity/exercise needs.;Develop an individualized exercise prescription for aerobic and resistive training based on initial evaluation findings, risk stratification, comorbidities and participant's personal goals.       Expected Outcomes Short Term: Attend rehab on a regular basis to increase amount of physical activity.;Long Term: Add in home exercise to make exercise part of routine and to increase amount of physical activity.;Long Term: Exercising regularly at least 3-5 days a week.       Increase Strength and Stamina Yes       Intervention Provide advice, education, support and counseling about physical activity/exercise needs.;Develop an individualized exercise prescription for aerobic and resistive training  based on initial evaluation findings, risk stratification, comorbidities and participant's personal goals.  Expected Outcomes Short Term: Increase workloads from initial exercise prescription for resistance, speed, and METs.;Short Term: Perform resistance training exercises routinely during rehab and add in resistance training at home;Long Term: Improve cardiorespiratory fitness, muscular endurance and strength as measured by increased METs and functional capacity (6MWT)       Able to understand and use rate of perceived exertion (RPE) scale Yes       Intervention Provide education and explanation on how to use RPE scale       Expected Outcomes Short Term: Able to use RPE daily in rehab to express subjective intensity level;Long Term:  Able to use RPE to guide intensity level when exercising independently       Knowledge and understanding of Target Heart Rate Range (THRR) Yes       Intervention Provide education and explanation of THRR including how the numbers were predicted and where they are located for reference       Expected Outcomes Short Term: Able to state/look up THRR;Long Term: Able to use THRR to govern intensity when exercising independently;Short Term: Able to use daily as guideline for intensity in rehab       Understanding of Exercise Prescription Yes       Intervention Provide education, explanation, and written materials on patient's individual exercise prescription       Expected Outcomes Short Term: Able to explain program exercise prescription;Long Term: Able to explain home exercise prescription to exercise independently                Exercise Goals Re-Evaluation :    Discharge Exercise Prescription (Final Exercise Prescription Changes):   Nutrition:  Target Goals: Understanding of nutrition guidelines, daily intake of sodium 1500mg , cholesterol 200mg , calories 30% from fat and 7% or less from saturated fats, daily to have 5 or more servings of fruits and  vegetables.  Biometrics:   Post Biometrics - 04/30/21 1138        Post  Biometrics   Waist Circumference 41 inches    Hip Circumference 40 inches    Waist to Hip Ratio 1.03 %    Triceps Skinfold 27 mm    % Body Fat 31.1 %    Grip Strength 32 kg    Flexibility 5 in    Single Leg Stand 30 seconds             Nutrition Therapy Plan and Nutrition Goals:   Nutrition Assessments:  MEDIFICTS Score Key: ?70 Need to make dietary changes  40-70 Heart Healthy Diet ? 40 Therapeutic Level Cholesterol Diet   Picture Your Plate Scores: <95 Unhealthy dietary pattern with much room for improvement. 41-50 Dietary pattern unlikely to meet recommendations for good health and room for improvement. 51-60 More healthful dietary pattern, with some room for improvement.  >60 Healthy dietary pattern, although there may be some specific behaviors that could be improved.    Nutrition Goals Re-Evaluation:   Nutrition Goals Discharge (Final Nutrition Goals Re-Evaluation):   Psychosocial: Target Goals: Acknowledge presence or absence of significant depression and/or stress, maximize coping skills, provide positive support system. Participant is able to verbalize types and ability to use techniques and skills needed for reducing stress and depression.  Initial Review & Psychosocial Screening:  Initial Psych Review & Screening - 04/30/21 1020       Initial Review   Current issues with None Identified      Family Dynamics   Good Support System? Yes    Comments Jesus Davenport recently moved  here to [facility] to be closer to his son and grandchildren. [name]'s wife passed away 6 years ago      Barriers   Psychosocial barriers to participate in program The patient should benefit from training in stress management and relaxation.      Screening Interventions   Interventions Encouraged to exercise    Expected Outcomes Long Term Goal: Stressors or current issues are controlled or eliminated.              Quality of Life Scores:  Quality of Life - 04/30/21 1147       Quality of Life   Select Quality of Life      Quality of Life Scores   Health/Function Pre 24.5 %    Socioeconomic Pre 25.57 %    Psych/Spiritual Pre 26 %    Family Pre 24 %    GLOBAL Pre 24.96 %            Scores of 19 and below usually indicate a poorer quality of life in these areas.  A difference of  2-3 points is a clinically meaningful difference.  A difference of 2-3 points in the total score of the Quality of Life Index has been associated with significant improvement in overall quality of life, self-image, physical symptoms, and general health in studies assessing change in quality of life.  PHQ-9: Recent Review Flowsheet Data     Depression screen [facility] 2/9 04/30/2021 06/18/2020   Decreased Interest 0 1   Down, Depressed, Hopeless 0 1   PHQ - 2 Score 0 2   Altered sleeping - 0   Tired, decreased energy - 2   Change in appetite - 0   Trouble concentrating - 0   Moving slowly or fidgety/restless - 0   Suicidal thoughts - 0   PHQ-9 Score - 4   Difficult doing work/chores - Not difficult at all      Interpretation of Total Score  Total Score Depression Severity:  1-4 = Minimal depression, 5-9 = Mild depression, 10-14 = Moderate depression, 15-19 = Moderately severe depression, 20-27 = Severe depression   Psychosocial Evaluation and Intervention:   Psychosocial Re-Evaluation:   Psychosocial Discharge (Final Psychosocial Re-Evaluation):   Vocational Rehabilitation: Provide vocational rehab assistance to qualifying candidates.   Vocational Rehab Evaluation & Intervention:  Vocational Rehab - 04/30/21 1024       Initial Vocational Rehab Evaluation & Intervention   Assessment shows need for Vocational Rehabilitation No      Vocational Rehab Re-Evaulation   Comments Jesus Davenport works full time and does not need vocational rehab at this time             Education: Education Goals:  Education classes will be provided on a weekly basis, covering required topics. Participant will state understanding/return demonstration of topics presented.  Learning Barriers/Preferences:  Learning Barriers/Preferences - 04/30/21 1154       Learning Barriers/Preferences   Learning Barriers Sight    Learning Preferences Group Instruction;Individual Instruction;Skilled Demonstration;Pictoral;Video;Written Material             Education Topics: Hypertension, Hypertension Reduction -Define heart disease and high blood pressure. Discus how high blood pressure affects the body and ways to reduce high blood pressure.   Exercise and Your Heart -Discuss why it is important to exercise, the FITT principles of exercise, normal and abnormal responses to exercise, and how to exercise safely.   Angina -Discuss definition of angina, causes of angina, treatment of angina, and how to  decrease risk of having angina.   Cardiac Medications -Review what the following cardiac medications are used for, how they affect the body, and side effects that may occur when taking the medications.  Medications include Aspirin, Beta blockers, calcium channel blockers, ACE Inhibitors, angiotensin receptor blockers, diuretics, digoxin, and antihyperlipidemics.   Congestive Heart Failure -Discuss the definition of CHF, how to live with CHF, the signs and symptoms of CHF, and how keep track of weight and sodium intake.   Heart Disease and Intimacy -Discus the effect sexual activity has on the heart, how changes occur during intimacy as we age, and safety during sexual activity.   Smoking Cessation / COPD -Discuss different methods to quit smoking, the health benefits of quitting smoking, and the definition of COPD.   Nutrition I: Fats -Discuss the types of cholesterol, what cholesterol does to the heart, and how cholesterol levels can be controlled.   Nutrition II: Labels -Discuss the different  components of food labels and how to read food label   Heart Parts/Heart Disease and PAD -Discuss the anatomy of the heart, the pathway of blood circulation through the heart, and these are affected by heart disease.   Stress I: Signs and Symptoms -Discuss the causes of stress, how stress may lead to anxiety and depression, and ways to limit stress.   Stress II: Relaxation -Discuss different types of relaxation techniques to limit stress.   Warning Signs of Stroke / TIA -Discuss definition of a stroke, what the signs and symptoms are of a stroke, and how to identify when someone is having stroke.   Knowledge Questionnaire Score:  Knowledge Questionnaire Score - 04/30/21 1155       Knowledge Questionnaire Score   Pre Score 25/28             Core Components/Risk Factors/Patient Goals at Admission:  Personal Goals and Risk Factors at Admission - 04/30/21 1158       Core Components/Risk Factors/Patient Goals on Admission    Weight Management Yes;Weight Maintenance;Weight Loss    Intervention Weight Management: Develop a combined nutrition and exercise program designed to reach desired caloric intake, while maintaining appropriate intake of nutrient and fiber, sodium and fats, and appropriate energy expenditure required for the weight goal.;Weight Management: Provide education and appropriate resources to help participant work on and attain dietary goals.    Diabetes Yes    Intervention Provide education about signs/symptoms and action to take for hypo/hyperglycemia.;Provide education about proper nutrition, including hydration, and aerobic/resistive exercise prescription along with prescribed medications to achieve blood glucose in normal ranges: Fasting glucose 65-99 mg/dL    Expected Outcomes Short Term: Participant verbalizes understanding of the signs/symptoms and immediate care of hyper/hypoglycemia, proper foot care and importance of medication, aerobic/resistive exercise  and nutrition plan for blood glucose control.;Long Term: Attainment of HbA1C < 7%.    Hypertension Yes    Intervention Provide education on lifestyle modifcations including regular physical activity/exercise, weight management, moderate sodium restriction and increased consumption of fresh fruit, vegetables, and low fat dairy, alcohol moderation, and smoking cessation.;Monitor prescription use compliance.    Expected Outcomes Short Term: Continued assessment and intervention until BP is < 140/67mm HG in hypertensive participants. < 130/51mm HG in hypertensive participants with diabetes, heart failure or chronic kidney disease.;Long Term: Maintenance of blood pressure at goal levels.    Lipids Yes    Intervention Provide education and support for participant on nutrition & aerobic/resistive exercise along with prescribed medications to achieve LDL 70mg , HDL >40mg .  Expected Outcomes Short Term: Participant states understanding of desired cholesterol values and is compliant with medications prescribed. Participant is following exercise prescription and nutrition guidelines.;Long Term: Cholesterol controlled with medications as prescribed, with individualized exercise RX and with personalized nutrition plan. Value goals: LDL < 70mg , HDL > 40 mg.    Stress Yes    Intervention Offer individual and/or small group education and counseling on adjustment to heart disease, stress management and health-related lifestyle change. Teach and support self-help strategies.;Refer participants experiencing significant psychosocial distress to appropriate mental health specialists for further evaluation and treatment. When possible, include family members and significant others in education/counseling sessions.    Expected Outcomes Short Term: Participant demonstrates changes in health-related behavior, relaxation and other stress management skills, ability to obtain effective social support, and compliance with  psychotropic medications if prescribed.;Long Term: Emotional wellbeing is indicated by absence of clinically significant psychosocial distress or social isolation.    Personal Goal Other Yes    Personal Goal Short term walk further Long term: gain stamina    Intervention Will continue to monitor pt and progress workloads as tolerated without sign or symptom    Expected Outcomes Pt will achieve their goals             Core Components/Risk Factors/Patient Goals Review:    Core Components/Risk Factors/Patient Goals at Discharge (Final Review):    ITP Comments:  ITP Comments     Row Name 04/30/21 0929 05/05/21 1124         ITP Comments Dr Fransico Him MD, Medical Director 30 Day ITP Review. Jesus Davenport will start exercise at cardiac rehab on 05/11/21               Comments: See ITP Comments.Barnet Pall, RN,BSN 05/05/2021 11:27 AM

## 2021-05-06 ENCOUNTER — Encounter (HOSPITAL_COMMUNITY): Payer: Medicare Other

## 2021-05-08 ENCOUNTER — Encounter (HOSPITAL_COMMUNITY): Payer: Medicare Other

## 2021-05-11 ENCOUNTER — Encounter (HOSPITAL_COMMUNITY): Payer: Medicare Other

## 2021-05-13 ENCOUNTER — Encounter (HOSPITAL_COMMUNITY): Payer: Medicare Other

## 2021-05-14 ENCOUNTER — Telehealth (HOSPITAL_COMMUNITY): Payer: Self-pay | Admitting: Family Medicine

## 2021-05-15 ENCOUNTER — Encounter (HOSPITAL_COMMUNITY): Payer: Medicare Other

## 2021-05-18 ENCOUNTER — Encounter (HOSPITAL_COMMUNITY)
Admission: RE | Admit: 2021-05-18 | Discharge: 2021-05-18 | Disposition: A | Discharge status: Home / Self Care | Payer: Medicare Other | Source: Ambulatory Visit | Attending: Cardiology | Admitting: Cardiology

## 2021-05-18 ENCOUNTER — Other Ambulatory Visit: Payer: Self-pay

## 2021-05-18 ENCOUNTER — Encounter (HOSPITAL_COMMUNITY): Payer: Medicare Other

## 2021-05-18 DIAGNOSIS — Z951 Presence of aortocoronary bypass graft: Secondary | ICD-10-CM | POA: Insufficient documentation

## 2021-05-18 DIAGNOSIS — Z7984 Long term (current) use of oral hypoglycemic drugs: Secondary | ICD-10-CM | POA: Insufficient documentation

## 2021-05-18 LAB — GLUCOSE, CAPILLARY
Glucose-Capillary: 115 mg/dL — ABNORMAL HIGH (ref 70–99)
Glucose-Capillary: 118 mg/dL — ABNORMAL HIGH (ref 70–99)

## 2021-05-18 NOTE — Progress Notes (Signed)
Daily Session Note  Patient Details  Name: Jesus BAUTCH Sr. MRN: [id number] Date of Birth: July 31, 1955 Referring Provider:   Flowsheet Row CARDIAC REHAB PHASE II ORIENTATION from 04/30/2021 in [place]  Referring Provider Dr. Berniece Salines, DO       Encounter Date: 05/18/2021  Check In:  Session Check In - 05/18/21 1034       Check-In   Supervising physician immediately available to respond to emergencies [facility] immediately available    Physician(s) Dr. Broadus John    Location [facility]    Staff Present Esmeralda Links BS, ACSM EP-C, Exercise Physiologist;Laurenashley Viar Venetia Maxon, RN, BSN;Ramon Dredge, RN, MHA;Olinty Celesta Aver, MS, ACSM CEP, Exercise Physiologist;David Lilyan Punt, MS, ACSM-CEP, CCRP, Exercise Physiologist;Carlette Wilber Oliphant, RN, BSN    Virtual Visit No    Medication changes reported     No    Fall or balance concerns reported    No    Tobacco Cessation No Change    Warm-up and Cool-down Performed as group-led instruction    Resistance Training Performed Yes    VAD Patient? No    PAD/SET Patient? No      Pain Assessment   Currently in Pain? No/denies    Pain Score 0-No pain    Multiple Pain Sites No             Capillary Blood Glucose: Results for orders placed or performed during the hospital encounter of 05/18/21 (from the past 24 hour(s))  Glucose, capillary     Status: Abnormal   Collection Time: 05/18/21 10:34 AM  Result Value Ref Range   Glucose-Capillary 115 (H) 70 - 99 mg/dL  Glucose, capillary     Status: Abnormal   Collection Time: 05/18/21 11:16 AM  Result Value Ref Range   Glucose-Capillary 118 (H) 70 - 99 mg/dL     Exercise Prescription Changes - 05/18/21 1038       Response to Exercise   Blood Pressure (Admit) 132/80    Blood Pressure (Exercise) 130/82    Blood Pressure (Exit) 108/72    Heart Rate (Admit) 71 bpm    Heart Rate (Exercise) 97 bpm    Heart Rate (Exit) 75 bpm     Rating of Perceived Exertion (Exercise) 12    Symptoms None    Comments Off to a good start with exercise.    Duration Continue with 30 min of aerobic exercise without signs/symptoms of physical distress.    Intensity THRR unchanged      Progression   Progression Continue to progress workloads to maintain intensity without signs/symptoms of physical distress.    Average METs 1.8      Resistance Training   Training Prescription Yes    Weight 3    Reps 10-15    Time 10 Minutes      Interval Training   Interval Training No      Bike   Level 1.5    Minutes 15    METs 2.1      NuStep   Level 2    SPM 60    Minutes 15    METs 1.6             Social History   Tobacco Use  Smoking Status Every Day   Packs/day: 1.00   Types: Cigarettes  Smokeless Tobacco Never    Goals Met:  Exercise tolerated well No report of concerns or symptoms today Strength training completed today  Goals Unmet:  Not Applicable  Comments: Laurey Arrow started cardiac rehab today.  Pt tolerated light exercise without difficulty. VSS, telemetry-Sinus Rhythm, asymptomatic.  Medication list reconciled. Pt denies barriers to medicaiton compliance.  PSYCHOSOCIAL ASSESSMENT:  PHQ-0. Pt exhibits positive coping skills, hopeful outlook with supportive family. No psychosocial needs identified at this time, no psychosocial interventions necessary.    Pt enjoys spending time with family and traveeling.   Pt oriented to exercise equipment and routine.    Understanding verbalized. Barnet Pall, RN,BSN 05/18/2021 2:33 PM   Dr. Fransico Him is Medical Director for Cardiac Rehab at [facility].

## 2021-05-20 ENCOUNTER — Encounter (HOSPITAL_COMMUNITY): Payer: Medicare Other

## 2021-05-20 ENCOUNTER — Encounter (HOSPITAL_COMMUNITY)
Admission: RE | Admit: 2021-05-20 | Discharge: 2021-05-20 | Disposition: A | Discharge status: Home / Self Care | Payer: Medicare Other | Source: Ambulatory Visit | Attending: Cardiology | Admitting: Cardiology

## 2021-05-20 ENCOUNTER — Other Ambulatory Visit: Payer: Self-pay

## 2021-05-20 DIAGNOSIS — Z951 Presence of aortocoronary bypass graft: Secondary | ICD-10-CM | POA: Diagnosis not present

## 2021-05-20 LAB — GLUCOSE, CAPILLARY
Glucose-Capillary: 172 mg/dL — ABNORMAL HIGH (ref 70–99)
Glucose-Capillary: 122 mg/dL — ABNORMAL HIGH (ref 70–99)

## 2021-05-22 ENCOUNTER — Encounter (HOSPITAL_COMMUNITY): Payer: Medicare Other

## 2021-05-22 ENCOUNTER — Encounter (HOSPITAL_COMMUNITY)
Admission: RE | Admit: 2021-05-22 | Discharge: 2021-05-22 | Disposition: A | Discharge status: Home / Self Care | Payer: Medicare Other | Source: Ambulatory Visit | Attending: Cardiology | Admitting: Cardiology

## 2021-05-22 ENCOUNTER — Other Ambulatory Visit: Payer: Self-pay

## 2021-05-22 DIAGNOSIS — Z951 Presence of aortocoronary bypass graft: Secondary | ICD-10-CM

## 2021-05-22 LAB — GLUCOSE, CAPILLARY: Glucose-Capillary: 118 mg/dL — ABNORMAL HIGH (ref 70–99)

## 2021-05-25 ENCOUNTER — Encounter (HOSPITAL_COMMUNITY): Payer: Medicare Other

## 2021-05-27 ENCOUNTER — Encounter (HOSPITAL_COMMUNITY): Payer: Medicare Other

## 2021-05-29 ENCOUNTER — Encounter (HOSPITAL_COMMUNITY): Payer: Medicare Other

## 2021-06-01 ENCOUNTER — Encounter (HOSPITAL_COMMUNITY): Payer: Medicare Other

## 2021-06-01 ENCOUNTER — Telehealth (HOSPITAL_COMMUNITY): Payer: Self-pay | Admitting: *Deleted

## 2021-06-01 NOTE — Telephone Encounter (Signed)
Patient left message on department voicemail, he will be absent from cardiac rehab today. Patient's son has COVID. Patient will be out pending COVID test. Patient hopes to return on Wednesday.

## 2021-06-02 NOTE — Progress Notes (Addendum)
Cardiac Individual Treatment Plan  Patient Details  Name: Jesus SANGIOVANNI Sr. MRN: [id number] Date of Birth: 10/20/1955 Referring Provider:   Flowsheet Row CARDIAC REHAB PHASE II ORIENTATION from 04/30/2021 in [place]  Referring Provider Dr. 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 (Patient taking differently: Take 81 mg by mouth every evening.), Disp: 90 tablet, Rfl: 3   atorvastatin (LIPITOR) 80 MG tablet, Take 1 tablet (80 mg total) by mouth daily., Disp: 90 tablet, Rfl: 3   dutasteride (AVODART) 0.5 MG capsule, Take 1 capsule (0.5 mg total) by mouth daily., Disp: 90 capsule, Rfl: 3   empagliflozin (JARDIANCE) 10 MG TABS tablet, Take 1 tablet (10 mg total) by mouth daily before breakfast., Disp: 30 tablet, Rfl: 2   lisinopril (ZESTRIL) 10 MG tablet, Take 1 tablet (10 mg total) by mouth daily., Disp: 90 tablet, Rfl: 3   metoprolol tartrate (LOPRESSOR) 25 MG tablet, Take 1 tablet (25 mg total) by mouth 2 (two) times daily., Disp: 180 tablet, Rfl: 3  Past Medical History: Past Medical History:  Diagnosis Date   Cancer ([place])    Diabetes mellitus without complication ([place])    Hyperlipidemia 03/13/2021    Tobacco Use: Social History   Tobacco Use  Smoking Status Every Day   Packs/day: 1.00   Types: Cigarettes  Smokeless Tobacco Never    Labs: Recent Review Flowsheet Data     Labs for ITP Cardiac and Pulmonary Rehab Latest Ref Rng & Units 02/16/2021 02/16/2021 02/16/2021 02/16/2021 02/16/2021   Cholestrol 0 - 200 mg/dL - - - - -   LDLCALC 0 - 99 mg/dL - - - - -   LDLDIRECT mg/dL - - - - -   HDL >40 mg/dL - - - - -    Trlycerides <150 mg/dL - - - - -   Hemoglobin A1c 4.8 - 5.6 % - - - - -   PHART 7.350 - 7.450 7.355 7.226(L) 7.301(L) 7.326(L) 7.292(L)   PCO2ART 32.0 - 48.0 mmHg 40.1 36.1 27.5(L) 43.4 48.1(H)   HCO3 20.0 - 28.0 mmol/L 22.4 15.1(L) 13.8(L) 22.9 23.3   TCO2 22 - 32 mmol/L 24 16(L) 15(L) 24 25   ACIDBASEDEF 0.0 - 2.0 mmol/L 3.0(H) 12.0(H) 12.0(H) 3.0(H) 4.0(H)   O2SAT % 97.0 90.0 93.0 90.0 92.0       Capillary Blood Glucose: Lab Results  Component Value Date   GLUCAP 118 (H) 05/22/2021   GLUCAP 122 (H) 05/20/2021   GLUCAP 172 (H) 05/20/2021   GLUCAP 118 (H) 05/18/2021   GLUCAP 115 (H) 05/18/2021     Exercise Target Goals: Exercise Program Goal: Individual exercise prescription set using results from initial 6 min walk test and THRR while considering  patient's activity barriers and safety.   Exercise Prescription Goal: Initial exercise prescription builds to 30-45 minutes a day of aerobic activity, 2-3 days per week.  Home exercise guidelines will be given to patient during program as part of exercise prescription that the participant will acknowledge.  Activity Barriers & Risk Stratification:  Activity Barriers & Cardiac Risk Stratification - 04/30/21 1119  Activity Barriers & Cardiac Risk Stratification   Activity Barriers Back Problems    Cardiac Risk Stratification High             6 Minute Walk:  6 Minute Walk     Row Name 04/30/21 1110         6 Minute Walk   Phase Initial     Distance 1254 feet     Walk Time 6 minutes     # of Rest Breaks 0     MPH 2.37     METS 3.1     RPE 11     Perceived Dyspnea  0     VO2 Peak 10.86     Resting HR 97 bpm     Resting BP 128/78     Resting Oxygen Saturation  97 %     Exercise Oxygen Saturation  during 6 min walk 94 %     Max Ex. HR 115 bpm     Max Ex. BP 134/72     2 Minute Post BP 110/72              Oxygen Initial Assessment:   Oxygen Re-Evaluation:   Oxygen Discharge (Final Oxygen  Re-Evaluation):   Initial Exercise Prescription:  Initial Exercise Prescription - 04/30/21 1100       Date of Initial Exercise RX and Referring Provider   Date 04/30/21    Referring Provider Dr. Berniece Salines, DO    Expected Discharge Date 05/03/21      Bike   Level 1.5    Minutes 15    METs 1.9      NuStep   Level 2    SPM 80    Minutes 15    METs 2      Prescription Details   Frequency (times per week) 3    Duration Progress to 30 minutes of continuous aerobic without signs/symptoms of physical distress      Intensity   THRR 40-80% of Max Heartrate 62-123    Ratings of Perceived Exertion 11-13    Perceived Dyspnea 0-4      Progression   Progression Continue progressive overload as per policy without signs/symptoms or physical distress.      Resistance Training   Training Prescription Yes    Weight 3    Reps 10-15             Perform Capillary Blood Glucose checks as needed.  Exercise Prescription Changes:   Exercise Prescription Changes     Row Name 05/18/21 1038 05/22/21 1032           Response to Exercise   Blood Pressure (Admit) 132/80 128/82      Blood Pressure (Exercise) 130/82 142/82      Blood Pressure (Exit) 108/72 132/82      Heart Rate (Admit) 71 bpm 81 bpm      Heart Rate (Exercise) 97 bpm 99 bpm      Heart Rate (Exit) 75 bpm 81 bpm      Rating of Perceived Exertion (Exercise) 12 11      Symptoms None None      Comments Off to a good start with exercise. --      Duration Continue with 30 min of aerobic exercise without signs/symptoms of physical distress. Continue with 30 min of aerobic exercise without signs/symptoms of physical distress.      Intensity THRR unchanged THRR unchanged        Progression  Progression Continue to progress workloads to maintain intensity without signs/symptoms of physical distress. Continue to progress workloads to maintain intensity without signs/symptoms of physical distress.      Average METs 1.8 2         Resistance Training   Training Prescription Yes Yes      Weight 3 3      Reps 10-15 10-15      Time 10 Minutes 10 Minutes        Interval Training   Interval Training No No        Bike   Level 1.5 1.5      Minutes 15 15      METs 2.1 2.2        NuStep   Level 2 2      SPM 60 60      Minutes 15 15      METs 1.6 1.8               Exercise Comments:   Exercise Comments     Row Name 05/18/21 1134           Exercise Comments Patient tolerated low intensity exercise well without symptoms.                Exercise Goals and Review:   Exercise Goals     Row Name 04/30/21 1137             Exercise Goals   Increase Physical Activity Yes       Intervention Provide advice, education, support and counseling about physical activity/exercise needs.;Develop an individualized exercise prescription for aerobic and resistive training based on initial evaluation findings, risk stratification, comorbidities and participant's personal goals.       Expected Outcomes Short Term: Attend rehab on a regular basis to increase amount of physical activity.;Long Term: Add in home exercise to make exercise part of routine and to increase amount of physical activity.;Long Term: Exercising regularly at least 3-5 days a week.       Increase Strength and Stamina Yes       Intervention Provide advice, education, support and counseling about physical activity/exercise needs.;Develop an individualized exercise prescription for aerobic and resistive training based on initial evaluation findings, risk stratification, comorbidities and participant's personal goals.       Expected Outcomes Short Term: Increase workloads from initial exercise prescription for resistance, speed, and METs.;Short Term: Perform resistance training exercises routinely during rehab and add in resistance training at home;Long Term: Improve cardiorespiratory fitness, muscular endurance and strength as measured by  increased METs and functional capacity (6MWT)       Able to understand and use rate of perceived exertion (RPE) scale Yes       Intervention Provide education and explanation on how to use RPE scale       Expected Outcomes Short Term: Able to use RPE daily in rehab to express subjective intensity level;Long Term:  Able to use RPE to guide intensity level when exercising independently       Knowledge and understanding of Target Heart Rate Range (THRR) Yes       Intervention Provide education and explanation of THRR including how the numbers were predicted and where they are located for reference       Expected Outcomes Short Term: Able to state/look up THRR;Long Term: Able to use THRR to govern intensity when exercising independently;Short Term: Able to use daily as guideline for intensity in rehab  Understanding of Exercise Prescription Yes       Intervention Provide education, explanation, and written materials on patient's individual exercise prescription       Expected Outcomes Short Term: Able to explain program exercise prescription;Long Term: Able to explain home exercise prescription to exercise independently                Exercise Goals Re-Evaluation :  Exercise Goals Re-Evaluation     Row Name 05/18/21 1134 06/02/21 0734           Exercise Goal Re-Evaluation   Exercise Goals Review Increase Physical Activity;Able to understand and use rate of perceived exertion (RPE) scale --      Comments Patient able to understand and use RPE scale appropriately. Patient out pending COVID test. Unable to review goals.      Expected Outcomes Progress workloads as tolerated to help improve cardiorespiratory fitness. Resume exercise once cleared to return.               Discharge Exercise Prescription (Final Exercise Prescription Changes):  Exercise Prescription Changes - 05/22/21 1032       Response to Exercise   Blood Pressure (Admit) 128/82    Blood Pressure (Exercise)  142/82    Blood Pressure (Exit) 132/82    Heart Rate (Admit) 81 bpm    Heart Rate (Exercise) 99 bpm    Heart Rate (Exit) 81 bpm    Rating of Perceived Exertion (Exercise) 11    Symptoms None    Duration Continue with 30 min of aerobic exercise without signs/symptoms of physical distress.    Intensity THRR unchanged      Progression   Progression Continue to progress workloads to maintain intensity without signs/symptoms of physical distress.    Average METs 2      Resistance Training   Training Prescription Yes    Weight 3    Reps 10-15    Time 10 Minutes      Interval Training   Interval Training No      Bike   Level 1.5    Minutes 15    METs 2.2      NuStep   Level 2    SPM 60    Minutes 15    METs 1.8             Nutrition:  Target Goals: Understanding of nutrition guidelines, daily intake of sodium 1500mg , cholesterol 200mg , calories 30% from fat and 7% or less from saturated fats, daily to have 5 or more servings of fruits and vegetables.  Biometrics:   Post Biometrics - 04/30/21 1138        Post  Biometrics   Waist Circumference 41 inches    Hip Circumference 40 inches    Waist to Hip Ratio 1.03 %    Triceps Skinfold 27 mm    % Body Fat 31.1 %    Grip Strength 32 kg    Flexibility 5 in    Single Leg Stand 30 seconds             Nutrition Therapy Plan and Nutrition Goals:   Nutrition Assessments:  MEDIFICTS Score Key: ?70 Need to make dietary changes  40-70 Heart Healthy Diet ? 40 Therapeutic Level Cholesterol Diet    Picture Your Plate Scores: <31 Unhealthy dietary pattern with much room for improvement. 41-50 Dietary pattern unlikely to meet recommendations for good health and room for improvement. 51-60 More healthful dietary pattern, with some room for improvement.  >  60 Healthy dietary pattern, although there may be some specific behaviors that could be improved.    Nutrition Goals Re-Evaluation:   Nutrition Goals  Re-Evaluation:   Nutrition Goals Discharge (Final Nutrition Goals Re-Evaluation):   Psychosocial: Target Goals: Acknowledge presence or absence of significant depression and/or stress, maximize coping skills, provide positive support system. Participant is able to verbalize types and ability to use techniques and skills needed for reducing stress and depression.  Initial Review & Psychosocial Screening:  Initial Psych Review & Screening - 04/30/21 1020       Initial Review   Current issues with None Identified      Family Dynamics   Good Support System? Yes    Comments Jesus Davenport recently moved here to [place] to be closer to his son and grandchildren. [name]'s wife passed away 6 years ago      Barriers   Psychosocial barriers to participate in program The patient should benefit from training in stress management and relaxation.      Screening Interventions   Interventions Encouraged to exercise    Expected Outcomes Long Term Goal: Stressors or current issues are controlled or eliminated.             Quality of Life Scores:  Quality of Life - 04/30/21 1147       Quality of Life   Select Quality of Life      Quality of Life Scores   Health/Function Pre 24.5 %    Socioeconomic Pre 25.57 %    Psych/Spiritual Pre 26 %    Family Pre 24 %    GLOBAL Pre 24.96 %            Scores of 19 and below usually indicate a poorer quality of life in these areas.  A difference of  2-3 points is a clinically meaningful difference.  A difference of 2-3 points in the total score of the Quality of Life Index has been associated with significant improvement in overall quality of life, self-image, physical symptoms, and general health in studies assessing change in quality of life.  PHQ-9: Recent Review Flowsheet Data     Depression screen [facility] 2/9 04/30/2021 06/18/2020   Decreased Interest 0 1   Down, Depressed, Hopeless 0 1   PHQ - 2 Score 0 2   Altered sleeping - 0   Tired,  decreased energy - 2   Change in appetite - 0   Trouble concentrating - 0   Moving slowly or fidgety/restless - 0   Suicidal thoughts - 0   PHQ-9 Score - 4   Difficult doing work/chores - Not difficult at all      Interpretation of Total Score  Total Score Depression Severity:  1-4 = Minimal depression, 5-9 = Mild depression, 10-14 = Moderate depression, 15-19 = Moderately severe depression, 20-27 = Severe depression   Psychosocial Evaluation and Intervention:   Psychosocial Re-Evaluation:  Psychosocial Re-Evaluation     [place] Name 05/18/21 1148             Psychosocial Re-Evaluation   Current issues with None Identified       Interventions Encouraged to attend Cardiac Rehabilitation for the exercise       Continue Psychosocial Services  No Follow up required                Psychosocial Discharge (Final Psychosocial Re-Evaluation):  Psychosocial Re-Evaluation - 05/18/21 1148       Psychosocial Re-Evaluation   Current issues with None Identified  Interventions Encouraged to attend Cardiac Rehabilitation for the exercise    Continue Psychosocial Services  No Follow up required             Vocational Rehabilitation: Provide vocational rehab assistance to qualifying candidates.   Vocational Rehab Evaluation & Intervention:  Vocational Rehab - 04/30/21 1024       Initial Vocational Rehab Evaluation & Intervention   Assessment shows need for Vocational Rehabilitation No      Vocational Rehab Re-Evaulation   Comments Jesus Davenport works full time and does not need vocational rehab at this time             Education: Education Goals: Education classes will be provided on a weekly basis, covering required topics. Participant will state understanding/return demonstration of topics presented.  Learning Barriers/Preferences:  Learning Barriers/Preferences - 04/30/21 1154       Learning Barriers/Preferences   Learning Barriers Sight    Learning Preferences  Group Instruction;Individual Instruction;Skilled Demonstration;Pictoral;Video;Written Material             Education Topics: Count Your Pulse:  -Group instruction provided by verbal instruction, demonstration, patient participation and written materials to support subject.  Instructors address importance of being able to find your pulse and how to count your pulse when at home without a heart monitor.  Patients get hands on experience counting their pulse with staff help and individually.   Heart Attack, Angina, and Risk Factor Modification:  -Group instruction provided by verbal instruction, video, and written materials to support subject.  Instructors address signs and symptoms of angina and heart attacks.    Also discuss risk factors for heart disease and how to make changes to improve heart health risk factors.   Functional Fitness:  -Group instruction provided by verbal instruction, demonstration, patient participation, and written materials to support subject.  Instructors address safety measures for doing things around the house.  Discuss how to get up and down off the floor, how to pick things up properly, how to safely get out of a chair without assistance, and balance training.   Meditation and Mindfulness:  -Group instruction provided by verbal instruction, patient participation, and written materials to support subject.  Instructor addresses importance of mindfulness and meditation practice to help reduce stress and improve awareness.  Instructor also leads participants through a meditation exercise.    Stretching for Flexibility and Mobility:  -Group instruction provided by verbal instruction, patient participation, and written materials to support subject.  Instructors lead participants through series of stretches that are designed to increase flexibility thus improving mobility.  These stretches are additional exercise for major muscle groups that are typically performed  during regular warm up and cool down.   Hands Only CPR:  -Group verbal, video, and participation provides a basic overview of AHA guidelines for community CPR. Role-play of emergencies allow participants the opportunity to practice calling for help and chest compression technique with discussion of AED use.   Hypertension: -Group verbal and written instruction that provides a basic overview of hypertension including the most recent diagnostic guidelines, risk factor reduction with self-care instructions and medication management.    Nutrition I class: Heart Healthy Eating:  -Group instruction provided by PowerPoint slides, verbal discussion, and written materials to support subject matter. The instructor gives an explanation and review of the Therapeutic Lifestyle Changes diet recommendations, which includes a discussion on lipid goals, dietary fat, sodium, fiber, plant stanol/sterol esters, sugar, and the components of a well-balanced, healthy diet.   Nutrition II class:  Lifestyle Skills:  -Group instruction provided by PowerPoint slides, verbal discussion, and written materials to support subject matter. The instructor gives an explanation and review of label reading, grocery shopping for heart health, heart healthy recipe modifications, and ways to make healthier choices when eating out.   Diabetes Question & Answer:  -Group instruction provided by PowerPoint slides, verbal discussion, and written materials to support subject matter. The instructor gives an explanation and review of diabetes co-morbidities, pre- and post-prandial blood glucose goals, pre-exercise blood glucose goals, signs, symptoms, and treatment of hypoglycemia and hyperglycemia, and foot care basics.   Diabetes Blitz:  -Group instruction provided by PowerPoint slides, verbal discussion, and written materials to support subject matter. The instructor gives an explanation and review of the physiology behind type 1 and  type 2 diabetes, diabetes medications and rational behind using different medications, pre- and post-prandial blood glucose recommendations and Hemoglobin A1c goals, diabetes diet, and exercise including blood glucose guidelines for exercising safely.    Portion Distortion:  -Group instruction provided by PowerPoint slides, verbal discussion, written materials, and food models to support subject matter. The instructor gives an explanation of serving size versus portion size, changes in portions sizes over the last 20 years, and what consists of a serving from each food group.   Stress Management:  -Group instruction provided by verbal instruction, video, and written materials to support subject matter.  Instructors review role of stress in heart disease and how to cope with stress positively.     Exercising on Your Own:  -Group instruction provided by verbal instruction, power point, and written materials to support subject.  Instructors discuss benefits of exercise, components of exercise, frequency and intensity of exercise, and end points for exercise.  Also discuss use of nitroglycerin and activating EMS.  Review options of places to exercise outside of rehab.  Review guidelines for sex with heart disease.   Cardiac Drugs I:  -Group instruction provided by verbal instruction and written materials to support subject.  Instructor reviews cardiac drug classes: antiplatelets, anticoagulants, beta blockers, and statins.  Instructor discusses reasons, side effects, and lifestyle considerations for each drug class.   Cardiac Drugs II:  -Group instruction provided by verbal instruction and written materials to support subject.  Instructor reviews cardiac drug classes: angiotensin converting enzyme inhibitors (ACE-I), angiotensin II receptor blockers (ARBs), nitrates, and calcium channel blockers.  Instructor discusses reasons, side effects, and lifestyle considerations for each drug  class.   Anatomy and Physiology of the Circulatory System:  Group verbal and written instruction and models provide basic cardiac anatomy and physiology, with the coronary electrical and arterial systems. Review of: AMI, Angina, Valve disease, Heart Failure, Peripheral Artery Disease, Cardiac Arrhythmia, Pacemakers, and the ICD.   Other Education:  -Group or individual verbal, written, or video instructions that support the educational goals of the cardiac rehab program.   Holiday Eating Survival Tips:  -Group instruction provided by PowerPoint slides, verbal discussion, and written materials to support subject matter. The instructor gives patients tips, tricks, and techniques to help them not only survive but enjoy the holidays despite the onslaught of food that accompanies the holidays.   Knowledge Questionnaire Score:  Knowledge Questionnaire Score - 04/30/21 1155       Knowledge Questionnaire Score   Pre Score 25/28             Core Components/Risk Factors/Patient Goals at Admission:  Personal Goals and Risk Factors at Admission - 04/30/21 1158  Core Components/Risk Factors/Patient Goals on Admission    Weight Management Yes;Weight Maintenance;Weight Loss    Intervention Weight Management: Develop a combined nutrition and exercise program designed to reach desired caloric intake, while maintaining appropriate intake of nutrient and fiber, sodium and fats, and appropriate energy expenditure required for the weight goal.;Weight Management: Provide education and appropriate resources to help participant work on and attain dietary goals.    Diabetes Yes    Intervention Provide education about signs/symptoms and action to take for hypo/hyperglycemia.;Provide education about proper nutrition, including hydration, and aerobic/resistive exercise prescription along with prescribed medications to achieve blood glucose in normal ranges: Fasting glucose 65-99 mg/dL    Expected  Outcomes Short Term: Participant verbalizes understanding of the signs/symptoms and immediate care of hyper/hypoglycemia, proper foot care and importance of medication, aerobic/resistive exercise and nutrition plan for blood glucose control.;Long Term: Attainment of HbA1C < 7%.    Hypertension Yes    Intervention Provide education on lifestyle modifcations including regular physical activity/exercise, weight management, moderate sodium restriction and increased consumption of fresh fruit, vegetables, and low fat dairy, alcohol moderation, and smoking cessation.;Monitor prescription use compliance.    Expected Outcomes Short Term: Continued assessment and intervention until BP is < 140/85mm HG in hypertensive participants. < 130/34mm HG in hypertensive participants with diabetes, heart failure or chronic kidney disease.;Long Term: Maintenance of blood pressure at goal levels.    Lipids Yes    Intervention Provide education and support for participant on nutrition & aerobic/resistive exercise along with prescribed medications to achieve LDL 70mg , HDL >40mg .    Expected Outcomes Short Term: Participant states understanding of desired cholesterol values and is compliant with medications prescribed. Participant is following exercise prescription and nutrition guidelines.;Long Term: Cholesterol controlled with medications as prescribed, with individualized exercise RX and with personalized nutrition plan. Value goals: LDL < 70mg , HDL > 40 mg.    Stress Yes    Intervention Offer individual and/or small group education and counseling on adjustment to heart disease, stress management and health-related lifestyle change. Teach and support self-help strategies.;Refer participants experiencing significant psychosocial distress to appropriate mental health specialists for further evaluation and treatment. When possible, include family members and significant others in education/counseling sessions.    Expected Outcomes  Short Term: Participant demonstrates changes in health-related behavior, relaxation and other stress management skills, ability to obtain effective social support, and compliance with psychotropic medications if prescribed.;Long Term: Emotional wellbeing is indicated by absence of clinically significant psychosocial distress or social isolation.    Personal Goal Other Yes    Personal Goal Short term walk further Long term: gain stamina    Intervention Will continue to monitor pt and progress workloads as tolerated without sign or symptom    Expected Outcomes Pt will achieve their goals             Core Components/Risk Factors/Patient Goals Review:   Goals and Risk Factor Review     Row Name 05/18/21 1151 06/01/21 1744           Core Components/Risk Factors/Patient Goals Review   Personal Goals Review Weight Management/Obesity;Diabetes;Lipids;Hypertension Weight Management/Obesity;Diabetes;Lipids;Hypertension      Review Jesus Davenport started exercise on 05/18/21 and did well with exercise. Jesus Davenport has attended 3 exercise seeions so far. Vital signs and CBG's have been stable.      Expected Outcomes Jesus Davenport will continue to participate in phase 2 cardiac rehab for exercise, nutrition and life style modifications Jesus Davenport will continue to participate in phase 2 cardiac rehab for exercise,  nutrition and life style modifications               Core Components/Risk Factors/Patient Goals at Discharge (Final Review):   Goals and Risk Factor Review - 06/01/21 1744       Core Components/Risk Factors/Patient Goals Review   Personal Goals Review Weight Management/Obesity;Diabetes;Lipids;Hypertension    Review Jesus Davenport has attended 3 exercise seeions so far. Vital signs and CBG's have been stable.    Expected Outcomes Jesus Davenport will continue to participate in phase 2 cardiac rehab for exercise, nutrition and life style modifications             ITP Comments:  ITP Comments     Row Name 04/30/21 0929 05/05/21  1124 06/01/21 1743       ITP Comments Dr Fransico Him MD, Medical Director 30 Day ITP Review. Jesus Davenport will start exercise at cardiac rehab on 05/11/21 30 Day ITP Review. Jesus Davenport has attended 3 exercise sessions so far. Jesus Davenport is currently on vacation              Comments: See ITP Comments

## 2021-06-03 ENCOUNTER — Encounter (HOSPITAL_COMMUNITY): Payer: Medicare Other

## 2021-06-03 ENCOUNTER — Telehealth (HOSPITAL_COMMUNITY): Payer: Self-pay | Admitting: Family Medicine

## 2021-06-05 ENCOUNTER — Encounter (HOSPITAL_COMMUNITY): Payer: Medicare Other

## 2021-06-05 ENCOUNTER — Telehealth (HOSPITAL_COMMUNITY): Payer: Self-pay | Admitting: *Deleted

## 2021-06-05 NOTE — Telephone Encounter (Signed)
Patient called to say that he will be out today with a sore throat and congestion. Patient's COVID-19 test was negative, but he doesn't want to get out in the cold, wet weather. Patient plans to return on Monday 06/08/2021.

## 2021-06-08 ENCOUNTER — Encounter (HOSPITAL_COMMUNITY)
Admission: RE | Admit: 2021-06-08 | Discharge: 2021-06-08 | Disposition: A | Discharge status: Home / Self Care | Payer: Medicare Other | Source: Ambulatory Visit | Attending: Cardiology | Admitting: Cardiology

## 2021-06-08 ENCOUNTER — Other Ambulatory Visit: Payer: Self-pay

## 2021-06-08 ENCOUNTER — Encounter (INDEPENDENT_AMBULATORY_CARE_PROVIDER_SITE_OTHER): Payer: Medicare Other | Admitting: Ophthalmology

## 2021-06-08 ENCOUNTER — Encounter (HOSPITAL_COMMUNITY): Payer: Medicare Other

## 2021-06-08 DIAGNOSIS — Z951 Presence of aortocoronary bypass graft: Secondary | ICD-10-CM | POA: Insufficient documentation

## 2021-06-08 NOTE — Progress Notes (Signed)
Reviewed home exercise guidelines with patient including endpoints, temperature precautions, target heart rate and rate of perceived exertion. Patient is walking 15-30 minutes most days of the week as his mode of home exercise. Patient voices understanding of instructions given.  Sol Passer, MS, ACSM CEP

## 2021-06-10 ENCOUNTER — Encounter (HOSPITAL_COMMUNITY): Payer: Medicare Other

## 2021-06-10 ENCOUNTER — Telehealth (HOSPITAL_COMMUNITY): Payer: Self-pay | Admitting: *Deleted

## 2021-06-10 NOTE — Telephone Encounter (Signed)
Spoke with pete he just found out that his 40 month grandchild has RSV. Laurey Arrow reports that he has a little bit of a cough otherwise he is asymptomatic. I advised Laurey Arrow that he stay out of exercise if he has any symptoms. Laurey Arrow will be out of town on Friday for a business trip. Laurey Arrow hopes to return to exercise on Monday of next week.Barnet Pall, RN,BSN 06/10/2021 9:27 AM

## 2021-06-12 ENCOUNTER — Encounter (HOSPITAL_COMMUNITY): Payer: Medicare Other

## 2021-06-15 ENCOUNTER — Encounter (HOSPITAL_COMMUNITY): Payer: Medicare Other

## 2021-06-15 ENCOUNTER — Telehealth (HOSPITAL_COMMUNITY): Payer: Self-pay | Admitting: Family Medicine

## 2021-06-16 ENCOUNTER — Telehealth (HOSPITAL_COMMUNITY): Payer: Self-pay | Admitting: Family Medicine

## 2021-06-17 ENCOUNTER — Encounter (HOSPITAL_COMMUNITY): Payer: Medicare Other

## 2021-06-19 ENCOUNTER — Encounter (INDEPENDENT_AMBULATORY_CARE_PROVIDER_SITE_OTHER): Payer: Medicare Other | Admitting: Ophthalmology

## 2021-06-19 ENCOUNTER — Encounter (HOSPITAL_COMMUNITY): Payer: Medicare Other

## 2021-06-19 ENCOUNTER — Other Ambulatory Visit: Payer: Self-pay

## 2021-06-19 DIAGNOSIS — H353122 Nonexudative age-related macular degeneration, left eye, intermediate dry stage: Secondary | ICD-10-CM | POA: Diagnosis not present

## 2021-06-19 DIAGNOSIS — H35033 Hypertensive retinopathy, bilateral: Secondary | ICD-10-CM

## 2021-06-19 DIAGNOSIS — H353211 Exudative age-related macular degeneration, right eye, with active choroidal neovascularization: Secondary | ICD-10-CM

## 2021-06-19 DIAGNOSIS — I10 Essential (primary) hypertension: Secondary | ICD-10-CM

## 2021-06-19 DIAGNOSIS — H43813 Vitreous degeneration, bilateral: Secondary | ICD-10-CM

## 2021-06-22 ENCOUNTER — Encounter (HOSPITAL_COMMUNITY): Payer: Medicare Other

## 2021-06-22 ENCOUNTER — Encounter (HOSPITAL_COMMUNITY)
Admission: RE | Admit: 2021-06-22 | Discharge: 2021-06-22 | Disposition: A | Discharge status: Home / Self Care | Payer: Medicare Other | Source: Ambulatory Visit | Attending: Cardiology | Admitting: Cardiology

## 2021-06-22 ENCOUNTER — Other Ambulatory Visit: Payer: Self-pay

## 2021-06-22 DIAGNOSIS — Z951 Presence of aortocoronary bypass graft: Secondary | ICD-10-CM

## 2021-06-22 NOTE — Progress Notes (Signed)
Jesus Davenport returned to exercise after being out due to not feeling well and other obligations. Jesus Davenport did well with exercise today. Barnet Pall, RN,BSN 06/22/2021 4:37 PM

## 2021-06-23 NOTE — Progress Notes (Signed)
Cardiac Individual Treatment Plan  Patient Details  Name: Jesus AMBROSINI Sr. MRN: [id number] Date of Birth: 05-26-1956 Referring Provider:   Flowsheet Row CARDIAC REHAB PHASE II ORIENTATION from 04/30/2021 in [facility]  Referring Provider Dr. 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 (Patient taking differently: Take 81 mg by mouth every evening.), Disp: 90 tablet, Rfl: 3   atorvastatin (LIPITOR) 80 MG tablet, Take 1 tablet (80 mg total) by mouth daily., Disp: 90 tablet, Rfl: 3   dutasteride (AVODART) 0.5 MG capsule, Take 1 capsule (0.5 mg total) by mouth daily., Disp: 90 capsule, Rfl: 3   empagliflozin (JARDIANCE) 10 MG TABS tablet, Take 1 tablet (10 mg total) by mouth daily before breakfast., Disp: 30 tablet, Rfl: 2   lisinopril (ZESTRIL) 10 MG tablet, Take 1 tablet (10 mg total) by mouth daily., Disp: 90 tablet, Rfl: 3   metoprolol tartrate (LOPRESSOR) 25 MG tablet, Take 1 tablet (25 mg total) by mouth 2 (two) times daily., Disp: 180 tablet, Rfl: 3  Past Medical History: Past Medical History:  Diagnosis Date   Cancer ([place])    Diabetes mellitus without complication ([place])    Hyperlipidemia 03/13/2021    Tobacco Use: Social History   Tobacco Use  Smoking Status Every Day   Packs/day: 1.00   Types: Cigarettes  Smokeless Tobacco Never    Labs: Recent Review Flowsheet Data     Labs for ITP Cardiac and Pulmonary Rehab Latest Ref Rng & Units 02/16/2021 02/16/2021 02/16/2021 02/16/2021 02/16/2021   Cholestrol 0 - 200 mg/dL - - - - -   LDLCALC 0 - 99 mg/dL - - - - -   LDLDIRECT mg/dL - - - - -   HDL >40 mg/dL - - - - -    Trlycerides <150 mg/dL - - - - -   Hemoglobin A1c 4.8 - 5.6 % - - - - -   PHART 7.350 - 7.450 7.355 7.226(L) 7.301(L) 7.326(L) 7.292(L)   PCO2ART 32.0 - 48.0 mmHg 40.1 36.1 27.5(L) 43.4 48.1(H)   HCO3 20.0 - 28.0 mmol/L 22.4 15.1(L) 13.8(L) 22.9 23.3   TCO2 22 - 32 mmol/L 24 16(L) 15(L) 24 25   ACIDBASEDEF 0.0 - 2.0 mmol/L 3.0(H) 12.0(H) 12.0(H) 3.0(H) 4.0(H)   O2SAT % 97.0 90.0 93.0 90.0 92.0       Capillary Blood Glucose: Lab Results  Component Value Date   GLUCAP 118 (H) 05/22/2021   GLUCAP 122 (H) 05/20/2021   GLUCAP 172 (H) 05/20/2021   GLUCAP 118 (H) 05/18/2021   GLUCAP 115 (H) 05/18/2021     Exercise Target Goals: Exercise Program Goal: Individual exercise prescription set using results from initial 6 min walk test and THRR while considering  patients activity barriers and safety.   Exercise Prescription Goal: Initial exercise prescription builds to 30-45 minutes a day of aerobic activity, 2-3 days per week.  Home exercise guidelines will be given to patient during program as part of exercise prescription that the participant will acknowledge.  Activity Barriers & Risk Stratification:  Activity Barriers & Cardiac Risk Stratification - 04/30/21 1119  Activity Barriers & Cardiac Risk Stratification   Activity Barriers Back Problems    Cardiac Risk Stratification High             6 Minute Walk:  6 Minute Walk     Row Name 04/30/21 1110         6 Minute Walk   Phase Initial     Distance 1254 feet     Walk Time 6 minutes     # of Rest Breaks 0     MPH 2.37     METS 3.1     RPE 11     Perceived Dyspnea  0     VO2 Peak 10.86     Resting HR 97 bpm     Resting BP 128/78     Resting Oxygen Saturation  97 %     Exercise Oxygen Saturation  during 6 min walk 94 %     Max Ex. HR 115 bpm     Max Ex. BP 134/72     2 Minute Post BP 110/72              Oxygen Initial Assessment:   Oxygen Re-Evaluation:   Oxygen Discharge (Final Oxygen  Re-Evaluation):   Initial Exercise Prescription:  Initial Exercise Prescription - 04/30/21 1100       Date of Initial Exercise RX and Referring Provider   Date 04/30/21    Referring Provider Dr. Berniece Salines, DO    Expected Discharge Date 05/03/21      Bike   Level 1.5    Minutes 15    METs 1.9      NuStep   Level 2    SPM 80    Minutes 15    METs 2      Prescription Details   Frequency (times per week) 3    Duration Progress to 30 minutes of continuous aerobic without signs/symptoms of physical distress      Intensity   THRR 40-80% of Max Heartrate 62-123    Ratings of Perceived Exertion 11-13    Perceived Dyspnea 0-4      Progression   Progression Continue progressive overload as per policy without signs/symptoms or physical distress.      Resistance Training   Training Prescription Yes    Weight 3    Reps 10-15             Perform Capillary Blood Glucose checks as needed.  Exercise Prescription Changes:   Exercise Prescription Changes     Row Name 05/18/21 1038 05/22/21 1032 06/08/21 1035 06/22/21 1034       Response to Exercise   Blood Pressure (Admit) 132/80 128/82 126/60 130/82    Blood Pressure (Exercise) 130/82 142/82 126/72 144/82    Blood Pressure (Exit) 108/72 132/82 120/80 118/70    Heart Rate (Admit) 71 bpm 81 bpm 75 bpm 89 bpm    Heart Rate (Exercise) 97 bpm 99 bpm 90 bpm 103 bpm    Heart Rate (Exit) 75 bpm 81 bpm 72 bpm 84 bpm    Rating of Perceived Exertion (Exercise) 12 11 12 11     Symptoms None None None None    Comments Off to a good start with exercise. -- -- --    Duration Continue with 30 min of aerobic exercise without signs/symptoms of physical distress. Continue with 30 min of aerobic exercise without signs/symptoms of physical distress. Continue with 30 min of aerobic exercise without signs/symptoms of physical distress. Continue with  30 min of aerobic exercise without signs/symptoms of physical distress.    Intensity THRR  unchanged THRR unchanged THRR unchanged THRR unchanged      Progression   Progression Continue to progress workloads to maintain intensity without signs/symptoms of physical distress. Continue to progress workloads to maintain intensity without signs/symptoms of physical distress. Continue to progress workloads to maintain intensity without signs/symptoms of physical distress. Continue to progress workloads to maintain intensity without signs/symptoms of physical distress.    Average METs 1.8 2 1.9 1.9      Resistance Training   Training Prescription Yes Yes Yes Yes    Weight 3 3 3 3     Reps 10-15 10-15 10-15 10-15    Time 10 Minutes 10 Minutes 10 Minutes 10 Minutes      Interval Training   Interval Training No No No No      Bike   Level 1.5 1.5 1.5 2    Minutes 15 15 15 15     METs 2.1 2.2 2.1 2.1      NuStep   Level 2 2 2 2     SPM 60 60 60 60    Minutes 15 15 15 15     METs 1.6 1.8 1.7 1.8      Home Exercise Plan   Plans to continue exercise at -- -- Home (comment)  Walking Home (comment)  Walking    Frequency -- -- Add 2 additional days to program exercise sessions. Add 2 additional days to program exercise sessions.    Initial Home Exercises Provided -- -- 06/08/21 06/08/21             Exercise Comments:   Exercise Comments     Row Name 05/18/21 1134 06/08/21 1105 06/22/21 1140       Exercise Comments Patient tolerated low intensity exercise well without symptoms. Reviewed home exercise guidelines and goals with patient. Increased workload on stationary bike and tolerated well.              Exercise Goals and Review:   Exercise Goals     Row Name 04/30/21 1137             Exercise Goals   Increase Physical Activity Yes       Intervention Provide advice, education, support and counseling about physical activity/exercise needs.;Develop an individualized exercise prescription for aerobic and resistive training based on initial evaluation findings, risk  stratification, comorbidities and participant's personal goals.       Expected Outcomes Short Term: Attend rehab on a regular basis to increase amount of physical activity.;Long Term: Add in home exercise to make exercise part of routine and to increase amount of physical activity.;Long Term: Exercising regularly at least 3-5 days a week.       Increase Strength and Stamina Yes       Intervention Provide advice, education, support and counseling about physical activity/exercise needs.;Develop an individualized exercise prescription for aerobic and resistive training based on initial evaluation findings, risk stratification, comorbidities and participant's personal goals.       Expected Outcomes Short Term: Increase workloads from initial exercise prescription for resistance, speed, and METs.;Short Term: Perform resistance training exercises routinely during rehab and add in resistance training at home;Long Term: Improve cardiorespiratory fitness, muscular endurance and strength as measured by increased METs and functional capacity (6MWT)       Able to understand and use rate of perceived exertion (RPE) scale Yes       Intervention Provide education and  explanation on how to use RPE scale       Expected Outcomes Short Term: Able to use RPE daily in rehab to express subjective intensity level;Long Term:  Able to use RPE to guide intensity level when exercising independently       Knowledge and understanding of Target Heart Rate Range (THRR) Yes       Intervention Provide education and explanation of THRR including how the numbers were predicted and where they are located for reference       Expected Outcomes Short Term: Able to state/look up THRR;Long Term: Able to use THRR to govern intensity when exercising independently;Short Term: Able to use daily as guideline for intensity in rehab       Understanding of Exercise Prescription Yes       Intervention Provide education, explanation, and written  materials on patient's individual exercise prescription       Expected Outcomes Short Term: Able to explain program exercise prescription;Long Term: Able to explain home exercise prescription to exercise independently                Exercise Goals Re-Evaluation :  Exercise Goals Re-Evaluation     Row Name 05/18/21 1134 06/02/21 0734 06/08/21 1105 06/22/21 1140       Exercise Goal Re-Evaluation   Exercise Goals Review Increase Physical Activity;Able to understand and use rate of perceived exertion (RPE) scale -- Increase Physical Activity;Able to understand and use rate of perceived exertion (RPE) scale;Increase Strength and Stamina;Able to check pulse independently;Knowledge and understanding of Target Heart Rate Range (THRR);Understanding of Exercise Prescription Increase Physical Activity;Able to understand and use rate of perceived exertion (RPE) scale;Increase Strength and Stamina;Able to check pulse independently;Knowledge and understanding of Target Heart Rate Range (THRR);Understanding of Exercise Prescription    Comments Patient able to understand and use RPE scale appropriately. Patient out pending COVID test. Unable to review goals. Reviewed exercise prescription with patient. Patient currently walking 15-30 minutes most days of the week. Discussed patient walking 30 minutes at least 2 days/week in addition to exericse at cardiac rehab, and patient is amenable to this. Patient has 3 lb weights at home to use for his resistance training exericses. Patient has a pulse oximeter to check his pulse. Patient returned to exercise today after being absent for 2 weeks for personal reasons. Patient tolerated exercise well. Increased workload on stationary bike.    Expected Outcomes Progress workloads as tolerated to help improve cardiorespiratory fitness. Resume exercise once cleared to return. Patient will walk 30 minutes 2-4 days/week in addition to exercise at cardiac rehab to help increase  strength and stamina. Continue to progress workloads as tolerated.             Discharge Exercise Prescription (Final Exercise Prescription Changes):  Exercise Prescription Changes - 06/22/21 1034       Response to Exercise   Blood Pressure (Admit) 130/82    Blood Pressure (Exercise) 144/82    Blood Pressure (Exit) 118/70    Heart Rate (Admit) 89 bpm    Heart Rate (Exercise) 103 bpm    Heart Rate (Exit) 84 bpm    Rating of Perceived Exertion (Exercise) 11    Symptoms None    Duration Continue with 30 min of aerobic exercise without signs/symptoms of physical distress.    Intensity THRR unchanged      Progression   Progression Continue to progress workloads to maintain intensity without signs/symptoms of physical distress.    Average METs 1.9  Resistance Training   Training Prescription Yes    Weight 3    Reps 10-15    Time 10 Minutes      Interval Training   Interval Training No      Bike   Level 2    Minutes 15    METs 2.1      NuStep   Level 2    SPM 60    Minutes 15    METs 1.8      Home Exercise Plan   Plans to continue exercise at Home (comment)   Walking   Frequency Add 2 additional days to program exercise sessions.    Initial Home Exercises Provided 06/08/21             Nutrition:  Target Goals: Understanding of nutrition guidelines, daily intake of sodium 1500mg , cholesterol 200mg , calories 30% from fat and 7% or less from saturated fats, daily to have 5 or more servings of fruits and vegetables.  Biometrics:   Post Biometrics - 04/30/21 1138        Post  Biometrics   Waist Circumference 41 inches    Hip Circumference 40 inches    Waist to Hip Ratio 1.03 %    Triceps Skinfold 27 mm    % Body Fat 31.1 %    Grip Strength 32 kg    Flexibility 5 in    Single Leg Stand 30 seconds             Nutrition Therapy Plan and Nutrition Goals:   Nutrition Assessments:  MEDIFICTS Score Key: ?70 Need to make dietary changes   40-70 Heart Healthy Diet ? 40 Therapeutic Level Cholesterol Diet    Picture Your Plate Scores: <66 Unhealthy dietary pattern with much room for improvement. 41-50 Dietary pattern unlikely to meet recommendations for good health and room for improvement. 51-60 More healthful dietary pattern, with some room for improvement.  >60 Healthy dietary pattern, although there may be some specific behaviors that could be improved.    Nutrition Goals Re-Evaluation:   Nutrition Goals Re-Evaluation:   Nutrition Goals Discharge (Final Nutrition Goals Re-Evaluation):   Psychosocial: Target Goals: Acknowledge presence or absence of significant depression and/or stress, maximize coping skills, provide positive support system. Participant is able to verbalize types and ability to use techniques and skills needed for reducing stress and depression.  Initial Review & Psychosocial Screening:  Initial Psych Review & Screening - 04/30/21 1020       Initial Review   Current issues with None Identified      Family Dynamics   Good Support System? Yes    Comments Jesus Davenport recently moved here to [place] to be closer to his son and grandchildren. Jesus Davenport passed away 6 years ago      Barriers   Psychosocial barriers to participate in program The patient should benefit from training in stress management and relaxation.      Screening Interventions   Interventions Encouraged to exercise    Expected Outcomes Long Term Goal: Stressors or current issues are controlled or eliminated.             Quality of Life Scores:  Quality of Life - 04/30/21 1147       Quality of Life   Select Quality of Life      Quality of Life Scores   Health/Function Pre 24.5 %    Socioeconomic Pre 25.57 %    Psych/Spiritual Pre 26 %    Family Pre 24 %  GLOBAL Pre 24.96 %            Scores of 19 and below usually indicate a poorer quality of life in these areas.  A difference of  2-3 points is a  clinically meaningful difference.  A difference of 2-3 points in the total score of the Quality of Life Index has been associated with significant improvement in overall quality of life, self-image, physical symptoms, and general health in studies assessing change in quality of life.  PHQ-9: Recent Review Flowsheet Data     Depression screen [facility] 2/9 04/30/2021 06/18/2020   Decreased Interest 0 1   Down, Depressed, Hopeless 0 1   PHQ - 2 Score 0 2   Altered sleeping - 0   Tired, decreased energy - 2   Change in appetite - 0   Trouble concentrating - 0   Moving slowly or fidgety/restless - 0   Suicidal thoughts - 0   PHQ-9 Score - 4   Difficult doing work/chores - Not difficult at all      Interpretation of Total Score  Total Score Depression Severity:  1-4 = Minimal depression, 5-9 = Mild depression, 10-14 = Moderate depression, 15-19 = Moderately severe depression, 20-27 = Severe depression   Psychosocial Evaluation and Intervention:   Psychosocial Re-Evaluation:  Psychosocial Re-Evaluation     [place] Name 05/18/21 1148 06/22/21 1641           Psychosocial Re-Evaluation   Current issues with None Identified None Identified      Interventions Encouraged to attend Cardiac Rehabilitation for the exercise Encouraged to attend Cardiac Rehabilitation for the exercise      Continue Psychosocial Services  No Follow up required No Follow up required               Psychosocial Discharge (Final Psychosocial Re-Evaluation):  Psychosocial Re-Evaluation - 06/22/21 1641       Psychosocial Re-Evaluation   Current issues with None Identified    Interventions Encouraged to attend Cardiac Rehabilitation for the exercise    Continue Psychosocial Services  No Follow up required             Vocational Rehabilitation: Provide vocational rehab assistance to qualifying candidates.   Vocational Rehab Evaluation & Intervention:  Vocational Rehab - 04/30/21 1024       Initial  Vocational Rehab Evaluation & Intervention   Assessment shows need for Vocational Rehabilitation No      Vocational Rehab Re-Evaulation   Comments Jesus Davenport works full time and does not need vocational rehab at this time             Education: Education Goals: Education classes will be provided on a weekly basis, covering required topics. Participant will state understanding/return demonstration of topics presented.  Learning Barriers/Preferences:  Learning Barriers/Preferences - 04/30/21 1154       Learning Barriers/Preferences   Learning Barriers Sight    Learning Preferences Group Instruction;Individual Instruction;Skilled Demonstration;Pictoral;Video;Written Material             Education Topics: Count Your Pulse:  -Group instruction provided by verbal instruction, demonstration, patient participation and written materials to support subject.  Instructors address importance of being able to find your pulse and how to count your pulse when at home without a heart monitor.  Patients get hands on experience counting their pulse with staff help and individually.   Heart Attack, Angina, and Risk Factor Modification:  -Group instruction provided by verbal instruction, video, and written materials to support subject.  Instructors address signs and symptoms of angina and heart attacks.    Also discuss risk factors for heart disease and how to make changes to improve heart health risk factors.   Functional Fitness:  -Group instruction provided by verbal instruction, demonstration, patient participation, and written materials to support subject.  Instructors address safety measures for doing things around the house.  Discuss how to get up and down off the floor, how to pick things up properly, how to safely get out of a chair without assistance, and balance training.   Meditation and Mindfulness:  -Group instruction provided by verbal instruction, patient participation, and written  materials to support subject.  Instructor addresses importance of mindfulness and meditation practice to help reduce stress and improve awareness.  Instructor also leads participants through a meditation exercise.    Stretching for Flexibility and Mobility:  -Group instruction provided by verbal instruction, patient participation, and written materials to support subject.  Instructors lead participants through series of stretches that are designed to increase flexibility thus improving mobility.  These stretches are additional exercise for major muscle groups that are typically performed during regular warm up and cool down.   Hands Only CPR:  -Group verbal, video, and participation provides a basic overview of AHA guidelines for community CPR. Role-play of emergencies allow participants the opportunity to practice calling for help and chest compression technique with discussion of AED use.   Hypertension: -Group verbal and written instruction that provides a basic overview of hypertension including the most recent diagnostic guidelines, risk factor reduction with self-care instructions and medication management.    Nutrition I class: Heart Healthy Eating:  -Group instruction provided by PowerPoint slides, verbal discussion, and written materials to support subject matter. The instructor gives an explanation and review of the Therapeutic Lifestyle Changes diet recommendations, which includes a discussion on lipid goals, dietary fat, sodium, fiber, plant stanol/sterol esters, sugar, and the components of a well-balanced, healthy diet.   Nutrition II class: Lifestyle Skills:  -Group instruction provided by PowerPoint slides, verbal discussion, and written materials to support subject matter. The instructor gives an explanation and review of label reading, grocery shopping for heart health, heart healthy recipe modifications, and ways to make healthier choices when eating out.   Diabetes  Question & Answer:  -Group instruction provided by PowerPoint slides, verbal discussion, and written materials to support subject matter. The instructor gives an explanation and review of diabetes co-morbidities, pre- and post-prandial blood glucose goals, pre-exercise blood glucose goals, signs, symptoms, and treatment of hypoglycemia and hyperglycemia, and foot care basics.   Diabetes Blitz:  -Group instruction provided by PowerPoint slides, verbal discussion, and written materials to support subject matter. The instructor gives an explanation and review of the physiology behind type 1 and type 2 diabetes, diabetes medications and rational behind using different medications, pre- and post-prandial blood glucose recommendations and Hemoglobin A1c goals, diabetes diet, and exercise including blood glucose guidelines for exercising safely.    Portion Distortion:  -Group instruction provided by PowerPoint slides, verbal discussion, written materials, and food models to support subject matter. The instructor gives an explanation of serving size versus portion size, changes in portions sizes over the last 20 years, and what consists of a serving from each food group.   Stress Management:  -Group instruction provided by verbal instruction, video, and written materials to support subject matter.  Instructors review role of stress in heart disease and how to cope with stress positively.     Exercising on Your  Own:  -Group instruction provided by verbal instruction, power point, and written materials to support subject.  Instructors discuss benefits of exercise, components of exercise, frequency and intensity of exercise, and end points for exercise.  Also discuss use of nitroglycerin and activating EMS.  Review options of places to exercise outside of rehab.  Review guidelines for sex with heart disease.   Cardiac Drugs I:  -Group instruction provided by verbal instruction and written materials to  support subject.  Instructor reviews cardiac drug classes: antiplatelets, anticoagulants, beta blockers, and statins.  Instructor discusses reasons, side effects, and lifestyle considerations for each drug class.   Cardiac Drugs II:  -Group instruction provided by verbal instruction and written materials to support subject.  Instructor reviews cardiac drug classes: angiotensin converting enzyme inhibitors (ACE-I), angiotensin II receptor blockers (ARBs), nitrates, and calcium channel blockers.  Instructor discusses reasons, side effects, and lifestyle considerations for each drug class.   Anatomy and Physiology of the Circulatory System:  Group verbal and written instruction and models provide basic cardiac anatomy and physiology, with the coronary electrical and arterial systems. Review of: AMI, Angina, Valve disease, Heart Failure, Peripheral Artery Disease, Cardiac Arrhythmia, Pacemakers, and the ICD.   Other Education:  -Group or individual verbal, written, or video instructions that support the educational goals of the cardiac rehab program.   Holiday Eating Survival Tips:  -Group instruction provided by PowerPoint slides, verbal discussion, and written materials to support subject matter. The instructor gives patients tips, tricks, and techniques to help them not only survive but enjoy the holidays despite the onslaught of food that accompanies the holidays.   Knowledge Questionnaire Score:  Knowledge Questionnaire Score - 04/30/21 1155       Knowledge Questionnaire Score   Pre Score 25/28             Core Components/Risk Factors/Patient Goals at Admission:  Personal Goals and Risk Factors at Admission - 04/30/21 1158       Core Components/Risk Factors/Patient Goals on Admission    Weight Management Yes;Weight Maintenance;Weight Loss    Intervention Weight Management: Develop a combined nutrition and exercise program designed to reach desired caloric intake, while  maintaining appropriate intake of nutrient and fiber, sodium and fats, and appropriate energy expenditure required for the weight goal.;Weight Management: Provide education and appropriate resources to help participant work on and attain dietary goals.    Diabetes Yes    Intervention Provide education about signs/symptoms and action to take for hypo/hyperglycemia.;Provide education about proper nutrition, including hydration, and aerobic/resistive exercise prescription along with prescribed medications to achieve blood glucose in normal ranges: Fasting glucose 65-99 mg/dL    Expected Outcomes Short Term: Participant verbalizes understanding of the signs/symptoms and immediate care of hyper/hypoglycemia, proper foot care and importance of medication, aerobic/resistive exercise and nutrition plan for blood glucose control.;Long Term: Attainment of HbA1C < 7%.    Hypertension Yes    Intervention Provide education on lifestyle modifcations including regular physical activity/exercise, weight management, moderate sodium restriction and increased consumption of fresh fruit, vegetables, and low fat dairy, alcohol moderation, and smoking cessation.;Monitor prescription use compliance.    Expected Outcomes Short Term: Continued assessment and intervention until BP is < 140/83mm HG in hypertensive participants. < 130/72mm HG in hypertensive participants with diabetes, heart failure or chronic kidney disease.;Long Term: Maintenance of blood pressure at goal levels.    Lipids Yes    Intervention Provide education and support for participant on nutrition & aerobic/resistive exercise along with prescribed medications to  achieve LDL 70mg , HDL >40mg .    Expected Outcomes Short Term: Participant states understanding of desired cholesterol values and is compliant with medications prescribed. Participant is following exercise prescription and nutrition guidelines.;Long Term: Cholesterol controlled with medications as  prescribed, with individualized exercise RX and with personalized nutrition plan. Value goals: LDL < 70mg , HDL > 40 mg.    Stress Yes    Intervention Offer individual and/or small group education and counseling on adjustment to heart disease, stress management and health-related lifestyle change. Teach and support self-help strategies.;Refer participants experiencing significant psychosocial distress to appropriate mental health specialists for further evaluation and treatment. When possible, include family members and significant others in education/counseling sessions.    Expected Outcomes Short Term: Participant demonstrates changes in health-related behavior, relaxation and other stress management skills, ability to obtain effective social support, and compliance with psychotropic medications if prescribed.;Long Term: Emotional wellbeing is indicated by absence of clinically significant psychosocial distress or social isolation.    Personal Goal Other Yes    Personal Goal Short term walk further Long term: gain stamina    Intervention Will continue to monitor pt and progress workloads as tolerated without sign or symptom    Expected Outcomes Pt will achieve their goals             Core Components/Risk Factors/Patient Goals Review:   Goals and Risk Factor Review     Row Name 05/18/21 1151 06/01/21 1744 06/22/21 1641         Core Components/Risk Factors/Patient Goals Review   Personal Goals Review Weight Management/Obesity;Diabetes;Lipids;Hypertension Weight Management/Obesity;Diabetes;Lipids;Hypertension Weight Management/Obesity;Diabetes;Lipids;Hypertension     Review Jesus Davenport started exercise on 05/18/21 and did well with exercise. Jesus Davenport has attended 3 exercise seeions so far. Vital signs and CBG's have been stable. Jesus Davenport has attended 5 exercise sessions so far. Vital signs and CBG's have been stable. Jesus Davenport has been poor due to illness and other obligations.     Expected Outcomes  Jesus Davenport will continue to participate in phase 2 cardiac rehab for exercise, nutrition and life style modifications Jesus Davenport will continue to participate in phase 2 cardiac rehab for exercise, nutrition and life style modifications Jesus Davenport will continue to participate in phase 2 cardiac rehab for exercise, nutrition and life style modifications              Core Components/Risk Factors/Patient Goals at Discharge (Final Review):   Goals and Risk Factor Review - 06/22/21 1641       Core Components/Risk Factors/Patient Goals Review   Personal Goals Review Weight Management/Obesity;Diabetes;Lipids;Hypertension    Review Jesus Davenport has attended 5 exercise sessions so far. Vital signs and CBG's have been stable. Jesus Davenport has been poor due to illness and other obligations.    Expected Outcomes Jesus Davenport will continue to participate in phase 2 cardiac rehab for exercise, nutrition and life style modifications             ITP Comments:  ITP Comments     Row Name 04/30/21 0929 05/05/21 1124 06/01/21 1743 06/22/21 1639     ITP Comments Dr Fransico Him MD, Medical Director 30 Day ITP Review. Jesus Davenport will start exercise at cardiac rehab on 05/11/21 30 Day ITP Review. Jesus Davenport has attended 3 exercise sessions so far. Jesus Davenport is currently on vacation 30 Day ITP Review. Jesus Davenport has attended 5 exercise sessions so far. Jesus Davenport returned to exercise on 06/22/21.             Comments: See ITP Comments

## 2021-06-24 ENCOUNTER — Encounter (HOSPITAL_COMMUNITY): Payer: Medicare Other

## 2021-06-24 ENCOUNTER — Telehealth (HOSPITAL_COMMUNITY): Payer: Self-pay | Admitting: Family Medicine

## 2021-06-25 ENCOUNTER — Telehealth (HOSPITAL_COMMUNITY): Payer: Self-pay | Admitting: Family Medicine

## 2021-06-26 ENCOUNTER — Encounter (HOSPITAL_COMMUNITY): Payer: Medicare Other

## 2021-07-01 ENCOUNTER — Telehealth (HOSPITAL_COMMUNITY): Payer: Self-pay | Admitting: Family Medicine

## 2021-07-01 ENCOUNTER — Encounter (HOSPITAL_COMMUNITY): Payer: Medicare Other

## 2021-07-02 ENCOUNTER — Telehealth (HOSPITAL_COMMUNITY): Payer: Self-pay | Admitting: Family Medicine

## 2021-07-03 ENCOUNTER — Encounter (HOSPITAL_COMMUNITY): Payer: Medicare Other

## 2021-07-07 ENCOUNTER — Telehealth: Payer: Self-pay | Admitting: Family Medicine

## 2021-07-07 ENCOUNTER — Encounter (INDEPENDENT_AMBULATORY_CARE_PROVIDER_SITE_OTHER): Payer: Medicare Other | Admitting: Ophthalmology

## 2021-07-07 ENCOUNTER — Other Ambulatory Visit: Payer: Self-pay | Admitting: Family Medicine

## 2021-07-07 DIAGNOSIS — E1169 Type 2 diabetes mellitus with other specified complication: Secondary | ICD-10-CM

## 2021-07-07 MED ORDER — EMPAGLIFLOZIN 10 MG PO TABS: 10.0000 mg | ORAL_TABLET | Freq: Every day | ORAL | 2 refills | Status: DC

## 2021-07-07 NOTE — Telephone Encounter (Signed)
Lft detailed VM that rx was sent to the pharmacy. Dm/cma

## 2021-07-07 NOTE — Telephone Encounter (Signed)
Pt needs refill on jardiance.

## 2021-07-08 ENCOUNTER — Encounter (INDEPENDENT_AMBULATORY_CARE_PROVIDER_SITE_OTHER): Payer: Medicare Other | Admitting: Ophthalmology

## 2021-07-08 ENCOUNTER — Encounter (HOSPITAL_COMMUNITY)
Admission: RE | Admit: 2021-07-08 | Discharge: 2021-07-08 | Disposition: A | Discharge status: Home / Self Care | Payer: Medicare Other | Source: Ambulatory Visit | Attending: Cardiology | Admitting: Cardiology

## 2021-07-08 ENCOUNTER — Other Ambulatory Visit: Payer: Self-pay

## 2021-07-08 VITALS — BP 138/70 | HR 81 | Wt 205.7 lb

## 2021-07-08 DIAGNOSIS — Z951 Presence of aortocoronary bypass graft: Secondary | ICD-10-CM | POA: Diagnosis present

## 2021-07-08 NOTE — Progress Notes (Signed)
Discharge Progress Report  Patient Details  Name: Jesus Davenport. MRN: [id number] Date of Birth: [birth year] Referring Provider:   Flowsheet Row CARDIAC REHAB PHASE II ORIENTATION from 04/30/2021 in [place]  Referring Provider Dr. 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 Berniece Salines, DO    Expected Discharge Date 05/03/21      Bike   Level 1.5    Minutes 15    METs 1.9      NuStep   Level 2    SPM 80    Minutes 15    METs 2      Prescription Details   Frequency (times per week) 3    Duration Progress to 30 minutes of continuous aerobic without signs/symptoms of physical distress      Intensity   THRR 40-80% of Max Heartrate 62-123    Ratings of Perceived Exertion 11-13    Perceived Dyspnea 0-4      Progression   Progression Continue progressive overload as per policy without signs/symptoms or physical distress.      Resistance Training   Training Prescription Yes    Weight 3    Reps 10-15             Discharge Exercise Prescription (Final Exercise Prescription Changes):  Exercise Prescription Changes - 07/08/21 1046       Response to Exercise   Blood Pressure (Admit) 138/70    Blood Pressure (Exercise) 156/82    Blood Pressure (Exit) 120/62    Heart Rate (Admit) 81 bpm    Heart Rate (Exercise) 101 bpm    Heart Rate (Exit) 75 bpm    Rating of Perceived Exertion (Exercise) 12    Symptoms None    Duration Continue with 30 min of aerobic exercise without signs/symptoms  of physical distress.    Intensity THRR unchanged      Progression   Progression Continue to progress workloads to maintain intensity without signs/symptoms of physical distress.    Average METs 2.2      Resistance Training   Training Prescription No   Relaxation day, no weights     Interval Training   Interval Training No      Bike   Level 2    Minutes 15    METs 2.5      NuStep   Level 2    SPM 75    Minutes 15    METs 2      Home Exercise Plan   Plans to continue exercise at Home (comment)   Walking   Frequency Add 2 additional days to program exercise sessions.    Initial Home Exercises Provided 06/08/21             Functional Capacity:  6 Minute Walk     Row Name 04/30/21 1110         6 Minute Walk   Phase Initial  Distance 1254 feet     Walk Time 6 minutes     # of Rest Breaks 0     MPH 2.37     METS 3.1     RPE 11     Perceived Dyspnea  0     VO2 Peak 10.86     Resting HR 97 bpm     Resting BP 128/78     Resting Oxygen Saturation  97 %     Exercise Oxygen Saturation  during 6 min walk 94 %     Max Ex. HR 115 bpm     Max Ex. BP 134/72     2 Minute Post BP 110/72              Psychological, QOL, Others - Outcomes: PHQ 2/9: Depression screen [facility] 2/9 07/08/2021 04/30/2021 06/18/2020  Decreased Interest 0 0 1  Down, Depressed, Hopeless 0 0 1  PHQ - 2 Score 0 0 2  Altered sleeping - - 0  Tired, decreased energy - - 2  Change in appetite - - 0  Trouble concentrating - - 0  Moving slowly or fidgety/restless - - 0  Suicidal thoughts - - 0  PHQ-9 Score - - 4  Difficult doing work/chores - - Not difficult at all    Quality of Life:  Quality of Life - 04/30/21 1147       Quality of Life   Select Quality of Life      Quality of Life Scores   Health/Function Pre 24.5 %    Socioeconomic Pre 25.57 %    Psych/Spiritual Pre 26 %    Family Pre 24 %    GLOBAL Pre 24.96 %             Personal Goals: Goals established at  orientation with interventions provided to work toward goal.  Personal Goals and Risk Factors at Admission - 04/30/21 1158       Core Components/Risk Factors/Patient Goals on Admission    Weight Management Yes;Weight Maintenance;Weight Loss    Intervention Weight Management: Develop a combined nutrition and exercise program designed to reach desired caloric intake, while maintaining appropriate intake of nutrient and fiber, sodium and fats, and appropriate energy expenditure required for the weight goal.;Weight Management: Provide education and appropriate resources to help participant work on and attain dietary goals.    Diabetes Yes    Intervention Provide education about signs/symptoms and action to take for hypo/hyperglycemia.;Provide education about proper nutrition, including hydration, and aerobic/resistive exercise prescription along with prescribed medications to achieve blood glucose in normal ranges: Fasting glucose 65-99 mg/dL    Expected Outcomes Short Term: Participant verbalizes understanding of the signs/symptoms and immediate care of hyper/hypoglycemia, proper foot care and importance of medication, aerobic/resistive exercise and nutrition plan for blood glucose control.;Long Term: Attainment of HbA1C < 7%.    Hypertension Yes    Intervention Provide education on lifestyle modifcations including regular physical activity/exercise, weight management, moderate sodium restriction and increased consumption of fresh fruit, vegetables, and low fat dairy, alcohol moderation, and smoking cessation.;Monitor prescription use compliance.    Expected Outcomes Short Term: Continued assessment and intervention until BP is < 140/71mm HG in hypertensive participants. < 130/80mm HG in hypertensive participants with diabetes, heart failure or chronic kidney disease.;Long Term: Maintenance of blood pressure at goal levels.    Lipids Yes    Intervention Provide education and support for participant on  nutrition & aerobic/resistive exercise along with prescribed medications to achieve LDL <  70mg , HDL >40mg .    Expected Outcomes Short Term: Participant states understanding of desired cholesterol values and is compliant with medications prescribed. Participant is following exercise prescription and nutrition guidelines.;Long Term: Cholesterol controlled with medications as prescribed, with individualized exercise RX and with personalized nutrition plan. Value goals: LDL < 70mg , HDL > 40 mg.    Stress Yes    Intervention Offer individual and/or small group education and counseling on adjustment to heart disease, stress management and health-related lifestyle change. Teach and support self-help strategies.;Refer participants experiencing significant psychosocial distress to appropriate mental health specialists for further evaluation and treatment. When possible, include family members and significant others in education/counseling sessions.    Expected Outcomes Short Term: Participant demonstrates changes in health-related behavior, relaxation and other stress management skills, ability to obtain effective social support, and compliance with psychotropic medications if prescribed.;Long Term: Emotional wellbeing is indicated by absence of clinically significant psychosocial distress or social isolation.    Personal Goal Other Yes    Personal Goal Short term walk further Long term: gain stamina    Intervention Will continue to monitor pt and progress workloads as tolerated without sign or symptom    Expected Outcomes Pt will achieve their goals              Personal Goals Discharge:  Goals and Risk Factor Review     Row Name 05/18/21 1151 06/01/21 1744 06/22/21 1641         Core Components/Risk Factors/Patient Goals Review   Personal Goals Review Weight Management/Obesity;Diabetes;Lipids;Hypertension Weight Management/Obesity;Diabetes;Lipids;Hypertension Weight  Management/Obesity;Diabetes;Lipids;Hypertension     Review Laurey Arrow started exercise on 05/18/21 and did well with exercise. Laurey Arrow has attended 3 exercise seeions so far. Vital signs and CBG's have been stable. Laurey Arrow has attended 5 exercise sessions so far. Vital signs and CBG's have been stable. [name]'s attendance has been poor due to illness and other obligations.     Expected Outcomes Laurey Arrow will continue to participate in phase 2 cardiac rehab for exercise, nutrition and life style modifications Laurey Arrow will continue to participate in phase 2 cardiac rehab for exercise, nutrition and life style modifications Laurey Arrow will continue to participate in phase 2 cardiac rehab for exercise, nutrition and life style modifications              Exercise Goals and Review:  Exercise Goals     Row Name 04/30/21 1137             Exercise Goals   Increase Physical Activity Yes       Intervention Provide advice, education, support and counseling about physical activity/exercise needs.;Develop an individualized exercise prescription for aerobic and resistive training based on initial evaluation findings, risk stratification, comorbidities and participant's personal goals.       Expected Outcomes Short Term: Attend rehab on a regular basis to increase amount of physical activity.;Long Term: Add in home exercise to make exercise part of routine and to increase amount of physical activity.;Long Term: Exercising regularly at least 3-5 days a week.       Increase Strength and Stamina Yes       Intervention Provide advice, education, support and counseling about physical activity/exercise needs.;Develop an individualized exercise prescription for aerobic and resistive training based on initial evaluation findings, risk stratification, comorbidities and participant's personal goals.       Expected Outcomes Short Term: Increase workloads from initial exercise prescription for resistance, speed, and METs.;Short Term: Perform  resistance training exercises routinely during rehab and add in resistance  training at home;Long Term: Improve cardiorespiratory fitness, muscular endurance and strength as measured by increased METs and functional capacity (6MWT)       Able to understand and use rate of perceived exertion (RPE) scale Yes       Intervention Provide education and explanation on how to use RPE scale       Expected Outcomes Short Term: Able to use RPE daily in rehab to express subjective intensity level;Long Term:  Able to use RPE to guide intensity level when exercising independently       Knowledge and understanding of Target Heart Rate Range (THRR) Yes       Intervention Provide education and explanation of THRR including how the numbers were predicted and where they are located for reference       Expected Outcomes Short Term: Able to state/look up THRR;Long Term: Able to use THRR to govern intensity when exercising independently;Short Term: Able to use daily as guideline for intensity in rehab       Understanding of Exercise Prescription Yes       Intervention Provide education, explanation, and written materials on patient's individual exercise prescription       Expected Outcomes Short Term: Able to explain program exercise prescription;Long Term: Able to explain home exercise prescription to exercise independently                Exercise Goals Re-Evaluation:  Exercise Goals Re-Evaluation     Row Name 05/18/21 1134 06/02/21 0734 06/08/21 1105 06/22/21 1140 07/08/21 1144     Exercise Goal Re-Evaluation   Exercise Goals Review Increase Physical Activity;Able to understand and use rate of perceived exertion (RPE) scale -- Increase Physical Activity;Able to understand and use rate of perceived exertion (RPE) scale;Increase Strength and Stamina;Able to check pulse independently;Knowledge and understanding of Target Heart Rate Range (THRR);Understanding of Exercise Prescription Increase Physical Activity;Able to  understand and use rate of perceived exertion (RPE) scale;Increase Strength and Stamina;Able to check pulse independently;Knowledge and understanding of Target Heart Rate Range (THRR);Understanding of Exercise Prescription Increase Physical Activity;Able to understand and use rate of perceived exertion (RPE) scale;Increase Strength and Stamina;Able to check pulse independently;Knowledge and understanding of Target Heart Rate Range (THRR);Understanding of Exercise Prescription   Comments Patient able to understand and use RPE scale appropriately. Patient out pending COVID test. Unable to review goals. Reviewed exercise prescription with patient. Patient currently walking 15-30 minutes most days of the week. Discussed patient walking 30 minutes at least 2 days/week in addition to exericse at cardiac rehab, and patient is amenable to this. Patient has 3 lb weights at home to use for his resistance training exericses. Patient has a pulse oximeter to check his pulse. Patient returned to exercise today after being absent for 2 weeks for personal reasons. Patient tolerated exercise well. Increased workload on stationary bike. Patient will discharge from the cardiac rehab program today after 6 sessions. Patient will be travelling this week and decided not to extend his time in the program to make up missed sessions. Patient plans to continue walking independently at this time.   Expected Outcomes Progress workloads as tolerated to help improve cardiorespiratory fitness. Resume exercise once cleared to return. Patient will walk 30 minutes 2-4 days/week in addition to exercise at cardiac rehab to help increase strength and stamina. Continue to progress workloads as tolerated. Patient will walk 30 minutes 3-5 days/week to maintain health and fitness gains.            Nutrition & Weight -  Outcomes:   Post Biometrics - 04/30/21 1138        Post  Biometrics   Waist Circumference 41 inches    Hip Circumference 40  inches    Waist to Hip Ratio 1.03 %    Triceps Skinfold 27 mm    % Body Fat 31.1 %    Grip Strength 32 kg    Flexibility 5 in    Single Leg Stand 30 seconds             Nutrition:   Nutrition Discharge:   Education Questionnaire Score:  Knowledge Questionnaire Score - 04/30/21 1155       Knowledge Questionnaire Score   Pre Score 25/28             Goals reviewed with patient; copy given to patient. [name] completed cardiac rehab on 07/08/20 after completing 6 exercise sessions. Laurey Arrow had several absences due to family commitments and travel. Laurey Arrow says he plans to continue exercise by walking and using the hotel gyms when he is travelling out of town.Harrell Gave RN BSN

## 2021-07-10 ENCOUNTER — Encounter (INDEPENDENT_AMBULATORY_CARE_PROVIDER_SITE_OTHER): Payer: Medicare Other | Admitting: Ophthalmology

## 2021-07-21 ENCOUNTER — Ambulatory Visit (INDEPENDENT_AMBULATORY_CARE_PROVIDER_SITE_OTHER): Payer: Medicare Other | Admitting: Cardiology

## 2021-07-21 ENCOUNTER — Encounter: Payer: Self-pay | Admitting: Cardiology

## 2021-07-21 ENCOUNTER — Other Ambulatory Visit: Payer: Self-pay

## 2021-07-21 VITALS — BP 128/88 | HR 84 | Ht 68.0 in | Wt 208.0 lb

## 2021-07-21 DIAGNOSIS — E119 Type 2 diabetes mellitus without complications: Secondary | ICD-10-CM | POA: Diagnosis not present

## 2021-07-21 DIAGNOSIS — I2510 Atherosclerotic heart disease of native coronary artery without angina pectoris: Secondary | ICD-10-CM

## 2021-07-21 DIAGNOSIS — Z951 Presence of aortocoronary bypass graft: Secondary | ICD-10-CM | POA: Diagnosis not present

## 2021-07-21 DIAGNOSIS — I10 Essential (primary) hypertension: Secondary | ICD-10-CM | POA: Diagnosis not present

## 2021-07-21 DIAGNOSIS — E782 Mixed hyperlipidemia: Secondary | ICD-10-CM

## 2021-07-21 NOTE — Progress Notes (Signed)
Cardiology Office Note:    Date:  07/21/2021   ID:  Jesus Clayman Sr., DOB 05-May-1956, MRN [id number]  PCP:  Haydee Salter, MD  Cardiologist:  Berniece Salines, DO  Electrophysiologist:  None   Referring MD: No ref. provider found   Chief Complaint  Patient presents with   Follow-up    4 months.    History of Present Illness:    Jesus TRULSON Sr. is a 66 y.o. 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  He had a uncomplicated postop hospital stay.  Since his discharge he tells me that he has gradually been doing well.   Is on the patient in March 13, 2021 at that time he is post CABG.  He is doing well from a cardiovascular standpoint.  No changes in his medications.  Today he tells me that he is doing well.  He did give me some  on his ophthalmology appointments..  Past Medical History:  Diagnosis Date   Cancer ([place])    Diabetes mellitus without complication ([place])    Hyperlipidemia 03/13/2021    Past Surgical History:  Procedure Laterality Date   APPENDECTOMY     66yo   CORONARY ARTERY BYPASS GRAFT N/A 02/16/2021   Procedure: CORONARY ARTERY BYPASS GRAFTING (CABG)x 2 ON CARDIOPULMONARY BYPASS USING RIGHT GSV AND LIMA.;  Surgeon: Lajuana Matte, MD;  Location: [facility];  Service: Open Heart Surgery;  Laterality: N/A;   ENDOVEIN HARVEST OF GREATER SAPHENOUS VEIN Right 02/16/2021   Procedure: ENDOVEIN HARVEST OF GREATER SAPHENOUS VEIN;  Surgeon: Lajuana Matte, MD;  Location: [facility];  Service: Open Heart Surgery;  Laterality: Right;   MOHS SURGERY     SCC Rt arm   RIGHT/LEFT HEART CATH AND CORONARY ANGIOGRAPHY N/A 02/13/2021   Procedure: RIGHT/LEFT HEART CATH AND CORONARY ANGIOGRAPHY;  Surgeon: Belva Crome, MD;  Location: [facility];  Service: Cardiovascular;  Laterality: N/A;    TEE WITHOUT CARDIOVERSION N/A 02/16/2021   Procedure: TRANSESOPHAGEAL ECHOCARDIOGRAM (TEE);  Surgeon: Lajuana Matte, MD;  Location: [facility];  Service: Open Heart Surgery;  Laterality: N/A;   TONSILLECTOMY      Current Medications: Current Meds  Medication Sig   aspirin 81 MG EC tablet Take 1 tablet (81 mg total) by mouth daily. (Patient taking differently: Take 81 mg by mouth every evening.)   atorvastatin (LIPITOR) 80 MG tablet Take 1 tablet (80 mg total) by mouth daily.   dutasteride (AVODART) 0.5 MG capsule Take 1 capsule (0.5 mg total) by mouth daily.   empagliflozin (JARDIANCE) 10 MG TABS tablet Take 1 tablet (10 mg total) by mouth daily before breakfast.   lisinopril (ZESTRIL) 10 MG tablet Take 1 tablet (10 mg total) by mouth daily.   metoprolol tartrate (LOPRESSOR) 25 MG tablet Take 1 tablet (25 mg total) by mouth 2 (two) times daily.   Multiple Vitamins-Minerals (EYE MULTIVITAMIN) CAPS Take by mouth daily.     Allergies:   Patient has no known allergies.   Social History   Socioeconomic History   Marital status: Widowed    Spouse name: Not on file   Number of children: 1   Years of education: 16   Highest education level: Bachelor's degree (e.g., BA, AB, BS)  Occupational History   Occupation: Visual merchandiser  Comment: [employer]  Tobacco Use   Smoking status: Every Day    Packs/day: 1.00    Types: Cigarettes   Smokeless tobacco: Never  Vaping Use   Vaping Use: Never used  Substance and Sexual Activity   Alcohol use: Yes    Comment: Occas   Drug use: Never   Sexual activity: Not Currently  Other Topics Concern   Not on file  Social History Narrative   Not on file   Social Determinants of Health   Financial Resource Strain: Not on file  Food Insecurity: Not on file  Transportation Needs: Not on file  Physical Activity: Not on file  Stress: Not on file  Social Connections: Not on file     Family History: The patient's family history  includes Arthritis in his mother; Cancer in his father; Diabetes in his sister; Hypertension in his father; Stroke in his brother.  ROS:   Review of Systems  Constitution: Negative for decreased appetite, fever and weight gain.  HENT: Negative for congestion, ear discharge, hoarse voice and sore throat.   Eyes: Negative for discharge, redness, vision loss in right eye and visual halos.  Cardiovascular: Negative for chest pain, dyspnea on exertion, leg swelling, orthopnea and palpitations.  Respiratory: Negative for cough, hemoptysis, shortness of breath and snoring.   Endocrine: Negative for heat intolerance and polyphagia.  Hematologic/Lymphatic: Negative for bleeding problem. Does not bruise/bleed easily.  Skin: Negative for flushing, nail changes, rash and suspicious lesions.  Musculoskeletal: Negative for arthritis, joint pain, muscle cramps, myalgias, neck pain and stiffness.  Gastrointestinal: Negative for abdominal pain, bowel incontinence, diarrhea and excessive appetite.  Genitourinary: Negative for decreased libido, genital sores and incomplete emptying.  Neurological: Negative for brief paralysis, focal weakness, headaches and loss of balance.  Psychiatric/Behavioral: Negative for altered mental status, depression and suicidal ideas.  Allergic/Immunologic: Negative for HIV exposure and persistent infections.    EKGs/Labs/Other Studies Reviewed:    The following studies were reviewed today:   EKG:  None today     Transthoracic echocardiogram February 13, 2021 IMPRESSIONS   1. Left ventricular ejection fraction, by estimation, is 60 to 65%. The  left ventricle has normal function. The left ventricle has no regional  wall motion abnormalities. Left ventricular diastolic parameters are  consistent with Grade I diastolic  dysfunction (impaired relaxation).   2. Right ventricular systolic function is normal. The right ventricular  size is normal. There is normal pulmonary artery  systolic pressure.   3. The mitral valve is normal in structure. No evidence of mitral valve  regurgitation. No evidence of mitral stenosis.   4. The aortic valve is tricuspid. Aortic valve regurgitation is mild. No  aortic stenosis is present.   5. Aortic dilatation noted. There is moderate dilatation of the ascending  aorta, measuring 45 mm.   6. The inferior vena cava is normal in size with greater than 50%  respiratory variability, suggesting right atrial pressure of 3 mmHg.   Conclusion(s)/Recommendation(s): Recommend gated CTA of aorta to verify 45  mm ascending aorta.   FINDINGS   Left Ventricle: Left ventricular ejection fraction, by estimation, is 60  to 65%. The left ventricle has normal function. The left ventricle has no  regional wall motion abnormalities. The left ventricular internal cavity  size was normal in size. There is   no left ventricular hypertrophy. Left ventricular diastolic parameters  are consistent with Grade I diastolic dysfunction (impaired relaxation).   Right Ventricle: The right ventricular size is normal.  No increase in  right ventricular wall thickness. Right ventricular systolic function is  normal. There is normal pulmonary artery systolic pressure. The tricuspid  regurgitant velocity is 1.95 m/s, and   with an assumed right atrial pressure of 3 mmHg, the estimated right  ventricular systolic pressure is 79.3 mmHg.   Left Atrium: Left atrial size was normal in size.   Right Atrium: Right atrial size was normal in size.   Pericardium: There is no evidence of pericardial effusion.   Mitral Valve: The mitral valve is normal in structure. No evidence of  mitral valve regurgitation. No evidence of mitral valve stenosis.   Tricuspid Valve: The tricuspid valve is normal in structure. Tricuspid  valve regurgitation is not demonstrated. No evidence of tricuspid  stenosis.   Aortic Valve: The aortic valve is tricuspid. Aortic valve regurgitation is   mild. Aortic regurgitation PHT measures 508 msec. No aortic stenosis is  present.   Pulmonic Valve: The pulmonic valve was normal in structure. Pulmonic valve  regurgitation is not visualized. No evidence of pulmonic stenosis.   Aorta: Aortic dilatation noted. There is moderate dilatation of the  ascending aorta, measuring 45 mm.   Venous: The inferior vena cava is normal in size with greater than 50%  respiratory variability, suggesting right atrial pressure of 3 mmHg.   IAS/Shunts: No atrial level shunt detected by color flow Doppler.      Left heart catheterization February 13, 2021 Severe ostial LAD eccentric stenosis also partially involving the distal left main.  Slight reduction in LAD antegrade flow.  Lesion has the appearance of plaque rupture with dissection. Left main is large, greater than 6 mm in diameter Circumflex is widely patent Dominant and widely patent Hyperdynamic left ventricle.  LVEF greater than 55%. Dilated aorta   RECOMMENDATIONS:  LIMA to LAD 2D Doppler echocardiogram to assess aortic root size With decreased LAD flow, recommend keeping patient in-house, coordinate with surgeons, and manage accordingly.  Recent Labs: 02/15/2021: ALT 22; B Natriuretic Peptide 34.1 03/12/2021: BUN 16; Creatinine, Ser 0.76; Hemoglobin 12.7; Magnesium 2.1; Platelets 335; Potassium 4.4; Sodium 132 04/27/2021: TSH 2.83  Recent Lipid Panel    Component Value Date/Time   CHOL 128 02/15/2021 0652   TRIG 70 02/15/2021 0652   HDL 48 02/15/2021 0652   CHOLHDL 2.7 02/15/2021 0652   VLDL 14 02/15/2021 0652   LDLCALC 66 02/15/2021 0652   LDLDIRECT 102.0 06/18/2020 1455    Physical Exam:    VS:  BP 128/88 (BP Location: Left Arm, Patient Position: Sitting, Cuff Size: Large)    Pulse 84    Ht 5\' 8"  (1.727 m)    Wt 208 lb (94.3 kg)    BMI 31.63 kg/m     Wt Readings from Last 3 Encounters:  07/21/21 208 lb (94.3 kg)  07/08/21 205 lb 11 oz (93.3 kg)  04/30/21 194 lb 0.1 oz (88  kg)     GEN: Well nourished, well developed in no acute distress HEENT: Normal NECK: No JVD; No carotid bruits LYMPHATICS: No lymphadenopathy CARDIAC: S1S2 noted,RRR, no murmurs, rubs, gallops RESPIRATORY:  Clear to auscultation without rales, wheezing or rhonchi  ABDOMEN: Soft, non-tender, non-distended, +bowel sounds, no guarding. EXTREMITIES: No edema, No cyanosis, no clubbing MUSCULOSKELETAL:  No deformity  SKIN: Warm and dry NEUROLOGIC:  Alert and oriented x 3, non-focal PSYCHIATRIC:  Normal affect, good insight  ASSESSMENT:    1. Essential hypertension   2. Coronary artery disease involving native coronary artery of native heart without  angina pectoris   3. Diabetes mellitus without complication ([place])   4. S/P CABG x 2   5. Mixed hyperlipidemia    PLAN:     Is doing well from a cardiovascular standpoint.  He has completed his cardiac rehab.  He has also continued to be active.  He denies any symptoms.  Continue the patient on his aspirin 81 mg daily along with his statin.  Blood pressure is acceptable, continue with current antihypertensive regimen.  Hyperlipidemia - continue with current statin medication.  The patient is in agreement with the above plan. The patient left the office in stable condition.  The patient will follow up in months or sooner if needed.   Medication Adjustments/Labs and Tests Ordered: Current medicines are reviewed at length with the patient today.  Concerns regarding medicines are outlined above.  No orders of the defined types were placed in this encounter.  No orders of the defined types were placed in this encounter.   Patient Instructions  Medication Instructions:  Your physician recommends that you continue on your current medications as directed. Please refer to the Current Medication list given to you today.  *If you need a refill on your cardiac medications before your next appointment, please call your  pharmacy*   Follow-Up: At [facility], you and your health needs are our priority.  As part of our continuing mission to provide you with exceptional heart care, we have created designated Provider Care Teams.  These Care Teams include your primary Cardiologist (physician) and Advanced Practice Providers (APPs -  Physician Assistants and Nurse Practitioners) who all work together to provide you with the care you need, when you need it.  We recommend signing up for the patient portal called "MyChart".  Sign up information is provided on this After Visit Summary.  MyChart is used to connect with patients for Virtual Visits (Telemedicine).  Patients are able to view lab/test results, encounter notes, upcoming appointments, etc.  Non-urgent messages can be sent to your provider as well.   To learn more about what you can do with MyChart, go to [email or web address].    Your next appointment:   9 month(s)  The format for your next appointment:   In Person  Provider:   Berniece Salines, MD     Adopting a Healthy Lifestyle.  Know what a healthy weight is for you (roughly BMI <25) and aim to maintain this   Aim for 7+ servings of fruits and vegetables daily   65-80+ fluid ounces of water or unsweet tea for healthy kidneys   Limit to max 1 drink of alcohol per day; avoid smoking/tobacco   Limit animal fats in diet for cholesterol and heart health - choose grass fed whenever available   Avoid highly processed foods, and foods high in saturated/trans fats   Aim for low stress - take time to unwind and care for your mental health   Aim for 150 min of moderate intensity exercise weekly for heart health, and weights twice weekly for bone health   Aim for 7-9 hours of sleep daily   When it comes to diets, agreement about the perfect plan isnt easy to find, even among the experts. Experts at the [employer] developed an idea known as the Healthy Eating Plate. Just  imagine a plate divided into logical, healthy portions.   The emphasis is on diet quality:   Load up on vegetables and fruits - one-half of your plate: Aim for color  and variety, and remember that potatoes dont count.   Go for whole grains - one-quarter of your plate: Whole wheat, barley, wheat berries, quinoa, oats, brown rice, and foods made with them. If you want pasta, go with whole wheat pasta.   Protein power - one-quarter of your plate: Fish, chicken, beans, and nuts are all healthy, versatile protein sources. Limit red meat.   The diet, however, does go beyond the plate, offering a few other suggestions.   Use healthy plant oils, such as olive, canola, soy, corn, sunflower and peanut. Check the labels, and avoid partially hydrogenated oil, which have unhealthy trans fats.   If youre thirsty, drink water. Coffee and tea are good in moderation, but skip sugary drinks and limit milk and dairy products to one or two daily servings.   The type of carbohydrate in the diet is more important than the amount. Some sources of carbohydrates, such as vegetables, fruits, whole grains, and beans-are healthier than others.   Finally, stay active  Signed, Berniece Salines, DO  07/21/2021 9:44 AM    [place]

## 2021-07-21 NOTE — Patient Instructions (Signed)
Medication Instructions:  Your physician recommends that you continue on your current medications as directed. Please refer to the Current Medication list given to you today.  *If you need a refill on your cardiac medications before your next appointment, please call your pharmacy*   Follow-Up: At [facility], you and your health needs are our priority.  As part of our continuing mission to provide you with exceptional heart care, we have created designated Provider Care Teams.  These Care Teams include your primary Cardiologist (physician) and Advanced Practice Providers (APPs -  Physician Assistants and Nurse Practitioners) who all work together to provide you with the care you need, when you need it.  We recommend signing up for the patient portal called "MyChart".  Sign up information is provided on this After Visit Summary.  MyChart is used to connect with patients for Virtual Visits (Telemedicine).  Patients are able to view lab/test results, encounter notes, upcoming appointments, etc.  Non-urgent messages can be sent to your provider as well.   To learn more about what you can do with MyChart, go to [email or web address].    Your next appointment:   9 month(s)  The format for your next appointment:   In Person  Provider:   Berniece Salines, MD

## 2021-07-27 ENCOUNTER — Other Ambulatory Visit: Payer: Self-pay

## 2021-07-28 ENCOUNTER — Encounter: Payer: Self-pay | Admitting: Family Medicine

## 2021-07-28 ENCOUNTER — Ambulatory Visit (INDEPENDENT_AMBULATORY_CARE_PROVIDER_SITE_OTHER): Payer: Medicare Other | Admitting: Family Medicine

## 2021-07-28 VITALS — BP 136/84 | HR 73 | Temp 97.0°F | Ht 68.0 in | Wt 204.0 lb

## 2021-07-28 DIAGNOSIS — E1159 Type 2 diabetes mellitus with other circulatory complications: Secondary | ICD-10-CM

## 2021-07-28 DIAGNOSIS — I2510 Atherosclerotic heart disease of native coronary artery without angina pectoris: Secondary | ICD-10-CM | POA: Diagnosis not present

## 2021-07-28 DIAGNOSIS — I10 Essential (primary) hypertension: Secondary | ICD-10-CM

## 2021-07-28 DIAGNOSIS — H353190 Nonexudative age-related macular degeneration, unspecified eye, stage unspecified: Secondary | ICD-10-CM | POA: Insufficient documentation

## 2021-07-28 DIAGNOSIS — H353210 Exudative age-related macular degeneration, right eye, stage unspecified: Secondary | ICD-10-CM

## 2021-07-28 DIAGNOSIS — H353290 Exudative age-related macular degeneration, unspecified eye, stage unspecified: Secondary | ICD-10-CM | POA: Insufficient documentation

## 2021-07-28 DIAGNOSIS — I7121 Aneurysm of the ascending aorta, without rupture: Secondary | ICD-10-CM

## 2021-07-28 DIAGNOSIS — D229 Melanocytic nevi, unspecified: Secondary | ICD-10-CM

## 2021-07-28 LAB — HEMOGLOBIN A1C: Hgb A1c MFr Bld: 6.7 % — ABNORMAL HIGH (ref 4.6–6.5)

## 2021-07-28 LAB — GLUCOSE, RANDOM: Glucose, Bld: 93 mg/dL (ref 70–99)

## 2021-07-28 NOTE — Progress Notes (Addendum)
[facility] [address] [place] [address] Dept: [phone] Dept Fax: [phone]  Chronic Care Office Visit  Subjective:    Patient ID: Jesus Davenport., male    DOB: [birth year], 66 y.o..   MRN: [id number]  Chief Complaint  Patient presents with   Follow-up    3 month f/u. No concerns.   Not fasting (had 2 cups coffee)    History of Present Illness:  Patient is in today for reassessment of chronic medical issues.   Jesus Davenport has a history of CAD and underwent a 2-vessel CABG on 8/15. He had some associated CHF, which has apparently resolved. He was also noted to have a 4.5 cm dilated ascending aortic aneurysm. He is working with Jesus Davenport (cardiology). His is on aspirin and metoprolol for his heart. He is also on lisinopril for hypertension and atorvastatin for lipid management.   Jesus Davenport has a history of Type 2 diabetes. He is currently managed on Jardiance.   Jesus Davenport has a history of cataracts. He was scheduled for cataract removal in Nov. However, his ophthalmologist noted some macular degeneration. 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 Jesus Davenport recommended a series of eye injections weekly for 2 years as treatment. Jesus Davenport got a 2nd opinion from a friend who is an ophthalmologist in [place]. He and Jesus Davenport have recommended Jesus Davenport delay a few months (in light of his recent cardiac surgery) and seek an in-person 2nd opinion , as there may be other treatment options available.  Jesus Davenport also notes that he was seen at [facility]. He had two lesions punch biopsied (right upper abdomen and back). He had seen the pathology report indicating dysplastic junctional nevi with severe atypia and close margins. He had a similar lesion removed some years ago on his forearm, which included a  wide excision. He notes that in researching this, there would be low risk for progression to melanoma. He wonders about the need to have this excised.  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[place]) 07/28/2021   Macular degeneration- OS, dry 07/28/2021   Tobacco use disorder 04/27/2021   Bilateral cataracts 04/27/2021   Fatty liver 04/06/2021   Hyperlipidemia 03/13/2021   Ascending aortic aneurysm 03/13/2021   S/P CABG x 2 02/16/2021   CAD (coronary artery disease) 02/13/2021   Diabetes mellitus without complication ([place]) 54/27/0623   Essential hypertension 11/22/2019   BPH with elevated PSA 11/22/2019   Squamous cell carcinoma of arm, 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   Cervical arthritis 11/22/2019   DDD (degenerative disc disease), lumbar 11/22/2019   Past Surgical History:  Procedure Laterality Date   APPENDECTOMY     66yo   CORONARY ARTERY BYPASS GRAFT N/A 02/16/2021   Procedure: CORONARY ARTERY BYPASS GRAFTING (CABG)x 2 ON CARDIOPULMONARY BYPASS USING RIGHT GSV AND LIMA.;  Surgeon: Jesus Matte, MD;  Location: [facility];  Service: Open Heart Surgery;  Laterality: N/A;   ENDOVEIN HARVEST OF GREATER SAPHENOUS VEIN Right 02/16/2021   Procedure: ENDOVEIN HARVEST OF GREATER SAPHENOUS VEIN;  Surgeon: Jesus Matte, MD;  Location: [place];  Service: Open Heart Surgery;  Laterality: Right;   MOHS SURGERY     SCC Rt arm   RIGHT/LEFT HEART CATH AND CORONARY ANGIOGRAPHY  N/A 02/13/2021   Procedure: RIGHT/LEFT HEART CATH AND CORONARY ANGIOGRAPHY;  Surgeon: Jesus Crome, MD;  Location: [place] [facility];  Service: Cardiovascular;  Laterality: N/A;   TEE WITHOUT CARDIOVERSION N/A 02/16/2021   Procedure: TRANSESOPHAGEAL ECHOCARDIOGRAM (TEE);  Surgeon: Jesus Matte, MD;  Location: [place];  Service: Open Heart Surgery;  Laterality: N/A;   TONSILLECTOMY     Family History  Problem Relation Age  of Onset   Arthritis Mother    Cancer Father    Hypertension Father    Diabetes Sister    Stroke Brother    Outpatient Medications Prior to Visit  Medication Sig Dispense Refill   aspirin 81 MG EC tablet Take 1 tablet (81 mg total) by mouth daily. (Patient taking differently: Take 81 mg by mouth every evening.) 90 tablet 3   atorvastatin (LIPITOR) 80 MG tablet Take 1 tablet (80 mg total) by mouth daily. 90 tablet 3   dutasteride (AVODART) 0.5 MG capsule Take 1 capsule (0.5 mg total) by mouth daily. 90 capsule 3   empagliflozin (JARDIANCE) 10 MG TABS tablet Take 1 tablet (10 mg total) by mouth daily before breakfast. 30 tablet 2   lisinopril (ZESTRIL) 10 MG tablet Take 1 tablet (10 mg total) by mouth daily. 90 tablet 3   metoprolol tartrate (LOPRESSOR) 25 MG tablet Take 1 tablet (25 mg total) by mouth 2 (two) times daily. 180 tablet 3   Multiple Vitamins-Minerals (EYE MULTIVITAMIN) CAPS Take by mouth in the morning and at bedtime.     No facility-administered medications prior to visit.   No Known Allergies    Objective:   Today's Vitals   07/28/21 0921  BP: 136/84  Pulse: 73  Temp: (!) 97 F (36.1 C)  TempSrc: Temporal  SpO2: 97%  Weight: 204 lb (92.5 kg)  Height: 5\' 8"  (1.727 m)   Body mass index is 31.02 kg/m.   General: Well developed, well nourished. No acute distress. Skin: Warm and dry. Biopsy sites are healing well. Psych: Alert and oriented. Normal mood and affect.  Health Maintenance Due  Topic Date Due   OPHTHALMOLOGY EXAM  Never done   Hepatitis C Screening  Never done   TETANUS/TDAP  Never done   COLONOSCOPY (Pts 45-76yrs Insurance coverage will need to be confirmed)  Never done   Zoster Vaccines- Shingrix (2 of 2) 01/24/2020   COVID-19 Vaccine (5 - Booster for Moderna series) 06/02/2021     Assessment & Plan:   1. 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 I will check his A1c today. He will continue on  Jardiance.  - Glucose, random - Hemoglobin A1c  2. Essential hypertension BP is adequately controlled. Continue lisinopril and metoprolol.  3. Coronary artery disease involving native coronary artery of native heart without angina pectoris S/p 2-vessel CABG. Doing well. Prior CHF symptoms have resolved. Mr. Sanon has completed his cardiac rehab and is doing well. Continue aspirin and metoprolol.  4. Aneurysm of ascending aorta without rupture CT in Aug. demonstrated 4.5 cm ascending thoracic aortic aneurysm. He is due for follow-up CTA to assess for any progression.  - CT Angio Chest W/Cm &/Or Wo Cm; Future  5. Multiple atypical nevi Reviewed pathology report. Mr. Bostic and I reviewed information about dysplastic nevi with severe atypia. The general recommendation is that he have re-excision with 2-3 mm margins. He will plan to follow-upw ith dermatology about this.  6. Exudative age-related macular degeneration of right eye, unspecified stage (  [facility]) Mr. Shane Crutch was put off a little by the retinal specialist. I think it prudent that he seek a 2nd opinion prior to committing to more aggressive therapy.  Haydee Salter, MD

## 2021-08-03 NOTE — Addendum Note (Signed)
Encounter addended by: Magda Kiel, RN on: 08/03/2021 4:20 PM  Actions taken: Clinical Note Signed

## 2021-08-05 ENCOUNTER — Telehealth: Payer: Self-pay | Admitting: Family Medicine

## 2021-08-05 DIAGNOSIS — N400 Enlarged prostate without lower urinary tract symptoms: Secondary | ICD-10-CM

## 2021-08-05 MED ORDER — DUTASTERIDE 0.5 MG PO CAPS: 0.5000 mg | ORAL_CAPSULE | Freq: Every day | ORAL | 1 refills | Status: DC

## 2021-08-05 NOTE — Telephone Encounter (Signed)
Patient notified VIA phone. Dm/cma  

## 2021-08-18 ENCOUNTER — Ambulatory Visit
Admission: RE | Admit: 2021-08-18 | Discharge: 2021-08-18 | Disposition: A | Discharge status: Home / Self Care | Payer: Medicare Other | Source: Ambulatory Visit | Attending: Family Medicine | Admitting: Family Medicine

## 2021-08-18 DIAGNOSIS — I7121 Aneurysm of the ascending aorta, without rupture: Secondary | ICD-10-CM

## 2021-08-18 MED ORDER — IOPAMIDOL (ISOVUE-370) INJECTION 76%
75.0000 mL | Freq: Once | INTRAVENOUS | Status: AC | PRN
  Administered 2021-08-18: 75 mL via INTRAVENOUS

## 2021-08-19 ENCOUNTER — Encounter: Payer: Self-pay | Admitting: Family Medicine

## 2021-08-20 ENCOUNTER — Telehealth: Payer: Self-pay

## 2021-08-20 NOTE — Telephone Encounter (Signed)
Patient contacted the office asking if he should continue f/u with CT surgery for his 4.6cm TAA. He is s/p CABG with Dr. Kipp Brood 07/2021 and has TAA that was found on chest CT. He was inquiring if he should follow-up with Dr. Kipp Brood or with his Cardiologist. Advised that he can follow-up with our office with a PA. Patient acknowledged receipt and appointment made.

## 2021-09-07 ENCOUNTER — Other Ambulatory Visit: Payer: Self-pay

## 2021-09-07 ENCOUNTER — Ambulatory Visit (INDEPENDENT_AMBULATORY_CARE_PROVIDER_SITE_OTHER): Payer: Medicare Other | Admitting: Physician Assistant

## 2021-09-07 VITALS — BP 160/107 | HR 87 | Resp 20 | Ht 68.0 in | Wt 202.8 lb

## 2021-09-07 DIAGNOSIS — I2510 Atherosclerotic heart disease of native coronary artery without angina pectoris: Secondary | ICD-10-CM

## 2021-09-07 DIAGNOSIS — I7121 Aneurysm of the ascending aorta, without rupture: Secondary | ICD-10-CM

## 2021-09-07 DIAGNOSIS — I10 Essential (primary) hypertension: Secondary | ICD-10-CM | POA: Diagnosis not present

## 2021-09-07 MED ORDER — LISINOPRIL 20 MG PO TABS: 20.0000 mg | ORAL_TABLET | Freq: Every day | ORAL | 3 refills | Status: DC

## 2021-09-07 NOTE — Progress Notes (Signed)
? ?   ?[address] ?      [place] [address] ?            [phone]   ? ?  ?UNNAMED ZEIEN Sr. ?[id number] ?Mar 28, 1956 ? ? ?History of Present Illness: ? ? ?Mr. Portlock is well known to [facility].  He underwent Coronary bypass grafting by Dr. Kipp Brood August of 2022.  He was last evaluated by our office in September of 2022 at which time he was doing well.  The patient was initially diagnosed with an Aortic Aneurysm incidentally found on a lung cancer screening CT scan performed in 2021.  This aneurysm remained stable in size in August of 2022 on a CT scan as part of his coronary workup.  He presents today for repeat CTA of the chest and surveillance.  Overall the patient continues to do well.  He denies chest pain, but does have some shortness of breath at times.  He states this was present prior to his bypass surgery and really only bothers him when he walks up stairs or inclines.  He occasionally experiences some lower extremity edema.  He quit smoking for about 4 months after his bypass surgery, but unfortunately he has resumed smoking, however this is less than a half pack per day.  His blood pressure is elevated today and the patient states he has stopped checking this lately.  He also questions how long his sternotomy incision may feel numb. ? ?Current Outpatient Medications on File Prior to Visit  ?Medication Sig Dispense Refill  ? aspirin 81 MG EC tablet Take 1 tablet (81 mg total) by mouth daily. (Patient taking differently: Take 81 mg by mouth every evening.) 90 tablet 3  ? atorvastatin (LIPITOR) 80 MG tablet Take 1 tablet (80 mg total) by mouth daily. 90 tablet 3  ? dutasteride (AVODART) 0.5 MG capsule Take 1 capsule (0.5 mg total) by mouth daily. 90 capsule 1  ? empagliflozin (JARDIANCE) 10 MG TABS tablet Take 1 tablet (10 mg total) by mouth daily before breakfast. 30 tablet 2  ? metoprolol tartrate (LOPRESSOR) 25 MG tablet Take 1 tablet (25 mg total) by mouth 2 (two) times daily. 180  tablet 3  ? Multiple Vitamins-Minerals (EYE MULTIVITAMIN) CAPS Take by mouth in the morning and at bedtime.    ? ?No current facility-administered medications on file prior to visit.  ? ? ?BP (!) 160/107 (BP Location: Left Arm, Patient Position: Sitting, Cuff Size: Normal)   Pulse 87   Resp 20   Ht '5\' 8"'$  (1.727 m)   Wt 202 lb 12.8 oz (92 kg)   SpO2 92% Comment: RA  BMI 30.84 kg/m?  ? ?Physical Exam ? ?Gen: no apparent distress ?Heart: RRR ?Lungs: CTA bilaterally ?Ext: no edema ?Neuro: grossly normal. ? ?CTA Results: ? ?IMPRESSION: ?Stable 4.5 cm ascending thoracic aortic aneurysm. Recommend ?semi-annual imaging followup by CTA or MRA and referral to ?cardiothoracic surgery if not already obtained. This recommendation ?follows 2010 ACCF/AHA/AATS/ACR/ASA/SCA/SCAI/SIR/STS/SVM Guidelines ?for the Diagnosis and Management of Patients With Thoracic Aortic ?Disease. Circulation. 2010; 121: V494-W967. Aortic aneurysm NOS ?(ICD10-I71.9) ?  ?Aortic Atherosclerosis (ICD10-I70.0). ?  ?  ?Electronically Signed ?  By: Rolm Baptise M.D. ?  On: 08/18/2021 23:30 ? ?A/P: ? ?Ascending Aortic Aneurysm- incidentally found in 2021 on lung CA screening.. this measures 4.5 cm and has remained stable since that time.... we will plan to have him come back in 6 months with repeat CTA  ?HTN- on Lisinopril at 10  mg daily, SBP in the 170s today, repeat level in the 160s, previous readings were pretty well controlled... will increase Lisinopril to 20 mg daily, he is scheduled to follow up with PCP next month and he can follow up on additional BP control if needed ?Nicotine use- patient provided education on stopping smoking again ?Sternal numbness- patient isn't a year out from surgery, unfortunately I am unable to provide timeline on if/when numbness will resolve ?RTC in 6 months with repeat CTA chest ? ?Risk Modification: ? ?Statin:  YES ? ?Smoking cessation instruction/counseling given:  counseled patient on the dangers of tobacco use,  advised patient to stop smoking, and reviewed strategies to maximize success ? ?Patient was counseled on importance of Blood Pressure Control.  Despite Medical intervention if the patient notices persistently elevated blood pressure readings.  They are instructed to contact their Primary Care Physician ? ?Please avoid use of Fluoroquinolones as this can potentially increase your risk of Aortic Rupture and/or Dissection ? ?Patient educated on signs and symptoms of Aortic Dissection, handout also provided in AVS ? ?Ellwood Handler, PA-C ?09/07/21 ? ? ? ? ?

## 2021-09-07 NOTE — Patient Instructions (Signed)
You may return to driving an automobile as long as you are no longer requiring oral narcotic pain relievers during the daytime.  It would be wise to start driving only short distances during the daylight and gradually increase from there as you feel comfortable. ? ?Stop smoking immediately and permanently. ? ?AVOID FLUOROQUINOLONES AS THEY CAN INCREASE YOUR RISK OF DISSECTION ?

## 2021-09-23 ENCOUNTER — Encounter: Payer: Self-pay | Admitting: Gastroenterology

## 2021-10-02 ENCOUNTER — Other Ambulatory Visit: Payer: Self-pay | Admitting: Family Medicine

## 2021-10-02 DIAGNOSIS — E1169 Type 2 diabetes mellitus with other specified complication: Secondary | ICD-10-CM

## 2021-10-02 NOTE — Telephone Encounter (Signed)
Pt needs his Jardiance refilled and has requested it by 12 noon. ?

## 2021-10-02 NOTE — Telephone Encounter (Signed)
Chart supports rx refill ?Last ov: 07/10/21 ?Last refill: 07/28/21 ?

## 2021-10-26 ENCOUNTER — Encounter: Payer: Self-pay | Admitting: Family Medicine

## 2021-10-26 ENCOUNTER — Ambulatory Visit (INDEPENDENT_AMBULATORY_CARE_PROVIDER_SITE_OTHER): Payer: Medicare Other | Admitting: Family Medicine

## 2021-10-26 VITALS — BP 156/86 | HR 73 | Temp 97.6°F | Ht 68.0 in | Wt 198.8 lb

## 2021-10-26 DIAGNOSIS — I2510 Atherosclerotic heart disease of native coronary artery without angina pectoris: Secondary | ICD-10-CM

## 2021-10-26 DIAGNOSIS — E1159 Type 2 diabetes mellitus with other circulatory complications: Secondary | ICD-10-CM | POA: Diagnosis not present

## 2021-10-26 DIAGNOSIS — I10 Essential (primary) hypertension: Secondary | ICD-10-CM | POA: Diagnosis not present

## 2021-10-26 DIAGNOSIS — I7121 Aneurysm of the ascending aorta, without rupture: Secondary | ICD-10-CM

## 2021-10-26 DIAGNOSIS — Z1159 Encounter for screening for other viral diseases: Secondary | ICD-10-CM

## 2021-10-26 LAB — HEMOGLOBIN A1C: Hgb A1c MFr Bld: 6.8 % — ABNORMAL HIGH (ref 4.6–6.5)

## 2021-10-26 LAB — GLUCOSE, RANDOM: Glucose, Bld: 95 mg/dL (ref 70–99)

## 2021-10-26 MED ORDER — HYDROCHLOROTHIAZIDE 25 MG PO TABS: 25.0000 mg | ORAL_TABLET | Freq: Every day | ORAL | 3 refills | Status: DC

## 2021-10-26 NOTE — Progress Notes (Signed)
?[facility] ?[address] ?[place] [address] ?Dept: [phone] ?Dept Fax: [phone] ? ?Chronic Care Office Visit ? ?Subjective:  ? ? Patient ID: Jesus DANIELLO Sr., male    DOB: [birth year], 66 y.o..   MRN: [id number] ? ?Chief Complaint  ?Patient presents with  ? Follow-up  ?  3 month f/u.  No concerns.  Fasting today.   ? ? ?History of Present Illness: ? ?Patient is in today for reassessment of chronic medical issues. ? ?Jesus Davenport has a history of CAD and underwent a 2-vessel CABG in 02/2021. He had some associated CHF, which has apparently resolved. He was also noted to have a 4.5 cm dilated ascending aortic aneurysm. He is working with Dr. Harriet Masson (cardiology). His is on aspirin and metoprolol for his heart.  ? ?Jesus Davenport has hypertension, managed on lisinopril. At his recent cardiology appointment, his blood pressure had a systolic int he 468E. Dr. Harriet Masson increased his lisinopril dose to 20 mg daily. ? ?Jesus Davenport has hyperlipidemia. This is managed with atorvastatin. ?  ?Jesus Davenport has a history of Type 2 diabetes. He is currently managed on Jardiance. ?  ?Jesus Davenport has a history of cataracts and was diagnosed with macular degeneration this Fall. He got a 2nd opinion from a friend who is an ophthalmologist in [place]. He and Dr. Harriet Masson have recommended Jesus Davenport delay a few months (in light of his recent cardiac surgery) and seek an in-person 2nd opinion , as there may be other treatment options available. ? ?Past Medical History: ?Patient Active Problem List  ? Diagnosis Date Noted  ? Multiple atypical nevi 07/28/2021  ? Macular degeneration, wet- OD ([facility]) 07/28/2021  ? Macular degeneration- OS, dry 07/28/2021  ? Tobacco use disorder 04/27/2021  ? Bilateral cataracts 04/27/2021  ? Fatty liver 04/06/2021  ? Hyperlipidemia 03/13/2021  ? Ascending aortic aneurysm ([facility]) 03/13/2021  ? 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 CAD (coronary artery disease)  02/13/2021  ? Type 2 diabetes mellitus with cardiac complication ([facility]) 32/06/2481  ? Essential hypertension 11/22/2019  ? BPH with elevated PSA 11/22/2019  ? 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 Cervical arthritis 11/22/2019  ? DDD (degenerative disc disease), lumbar 11/22/2019  ? ?Past Surgical History:  ?Procedure Laterality Date  ? APPENDECTOMY    ? 66yo  ? CORONARY ARTERY BYPASS GRAFT N/A 02/16/2021  ? Procedure: CORONARY ARTERY BYPASS GRAFTING (CABG)x 2 ON CARDIOPULMONARY BYPASS USING RIGHT GSV AND LIMA.;  Surgeon: Lajuana Matte, MD;  Location: [facility];  Service: Open Heart Surgery;  Laterality: N/A;  ? ENDOVEIN HARVEST OF GREATER SAPHENOUS VEIN Right 02/16/2021  ? Procedure: ENDOVEIN HARVEST OF GREATER SAPHENOUS VEIN;  Surgeon: Lajuana Matte, MD;  Location: [facility];  Service: Open Heart Surgery;  Laterality: Right;  ? MOHS SURGERY    ? SCC Rt arm  ? RIGHT/LEFT HEART CATH AND CORONARY ANGIOGRAPHY N/A 02/13/2021  ? Procedure: RIGHT/LEFT HEART CATH AND CORONARY ANGIOGRAPHY;  Surgeon: Belva Crome, MD;  Location: [facility];  Service: Cardiovascular;  Laterality: N/A;  ? TEE WITHOUT CARDIOVERSION N/A 02/16/2021  ? Procedure: TRANSESOPHAGEAL ECHOCARDIOGRAM (TEE);  Surgeon: Lajuana Matte, MD;  Location: [facility];  Service: Open Heart Surgery;  Laterality: N/A;  ? TONSILLECTOMY    ? ?Family History  ?Problem Relation Age of Onset  ? Arthritis Mother   ? Cancer Father   ? Hypertension Father   ? Diabetes Sister   ?  Stroke Brother   ? ?Outpatient Medications Prior to Visit  ?Medication Sig Dispense Refill  ? aspirin 81 MG EC tablet Take 1 tablet (81 mg total) by mouth daily. (Patient taking differently: Take 81 mg by mouth every evening.) 90 tablet 3  ? atorvastatin (LIPITOR) 80 MG tablet Take 1 tablet (80 mg total) by mouth daily. 90 tablet 3  ? dutasteride (AVODART) 0.5 MG capsule Take 1 capsule (0.5 mg total) by mouth daily. 90 capsule 1  ? empagliflozin (JARDIANCE) 10 MG TABS  tablet TAKE ONE TABLET BY MOUTH DAILY BEFORE BREAKFAST 90 tablet 1  ? lisinopril (ZESTRIL) 20 MG tablet Take 1 tablet (20 mg total) by mouth daily. 90 tablet 3  ? metoprolol tartrate (LOPRESSOR) 25 MG tablet Take 1 tablet (25 mg total) by mouth 2 (two) times daily. 180 tablet 3  ? Multiple Vitamins-Minerals (EYE MULTIVITAMIN) CAPS Take by mouth in the morning and at bedtime.    ? ?No facility-administered medications prior to visit.  ? ?No Known Allergies ?   ?Objective:  ? ?Today's Vitals  ? 10/26/21 0914 10/26/21 0916  ?BP: (!) 156/84 (!) 156/86  ?Pulse: 73   ?Temp: 97.6 ?F (36.4 ?C)   ?TempSrc: Temporal   ?SpO2: 95%   ?Weight: 198 lb 12.8 oz (90.2 kg)   ?Height: '5\' 8"'$  (1.727 m)   ? ?Body mass index is 30.23 kg/m?.  ? ?General: Well developed, well nourished. No acute distress. ?Psych: Alert and oriented. Normal mood and affect. ? ?Health Maintenance Due  ?Topic Date Due  ? Hepatitis C Screening  Never done  ? TETANUS/TDAP  Never done  ? COLONOSCOPY (Pts 45-83yr Insurance coverage will need to be confirmed)  Never done  ? Zoster Vaccines- Shingrix (2 of 2) 01/24/2020  ? Pneumonia Vaccine 67 Years old (2 - PPSV23 if available, else PCV20) 01/23/2022  ? ?Imaging: ?CT Angiogram of Chest and Aorta (08/18/2021) ?IMPRESSION: ?Stable 4.5 cm ascending thoracic aortic aneurysm. Recommend semi-annual imaging followup by CTA or MRA and referral to cardiothoracic surgery if not already obtained. This recommendation ?follows 2010 ACCF/AHA/AATS/ACR/ASA/SCA/SCAI/SIR/STS/SVM Guidelines for the Diagnosis and Management of Patients With Thoracic Aortic Disease. Circulation. 2010; 121:: B017-P102 Aortic aneurysm NOS (ICD10-I71.9) ?  ?Aortic Atherosclerosis (ICD10-I70.0). ?   ?Assessment & Plan:  ? ?1. Type 2 diabetes mellitus with cardiac complication (HCC) ?Last A1c at goal on Jardiance. I will check an A1c today. Plan follow-up in 3 months. [facility] to come to see me for care. ? ?-  Glucose, random ?- Hemoglobin A1c ? ?2. Essential hypertension ?Blood pressure remains above goal today. I willa dd HCTZ to his regimen. ? ?- hydrochlorothiazide (HYDRODIURIL) 25 MG tablet; Take 1 tablet (25 mg total) by mouth daily.  Dispense: 90 tablet; Refill: 3 ? ?3. Coronary artery disease involving native coronary artery of native heart without angina pectoris ?Stable. No chest pain. ? ?4. Aneurysm of ascending aorta without rupture (HCC) ?The Aneurysm is unchanged. Plan repeat CTA in August. ? ?5. Encounter for hepatitis C screening test for low risk patient ? ?- HCV Ab w Reflex to Quant PCR ? ? ?Return in about 3 months (around 01/25/2022) for Reassessment.  ? ?SHaydee Salter MD ?

## 2021-10-28 LAB — HCV AB W REFLEX TO QUANT PCR: HCV Ab: NONREACTIVE

## 2021-10-28 LAB — HCV INTERPRETATION

## 2021-12-02 ENCOUNTER — Ambulatory Visit (INDEPENDENT_AMBULATORY_CARE_PROVIDER_SITE_OTHER): Payer: Medicare Other

## 2021-12-02 DIAGNOSIS — Z0000 Encounter for general adult medical examination without abnormal findings: Secondary | ICD-10-CM

## 2021-12-02 NOTE — Progress Notes (Signed)
Subjective:   Jesus Davenport. is a 66 y.o. male who presents for an Initial Medicare Annual Wellness Visit.   I connected with Barnett Abu today by telephone and verified that I am speaking with the correct person using two identifiers. Location patient: home Location provider: work Persons participating in the virtual visit: patient, provider.   I discussed the limitations, risks, security and privacy concerns of performing an evaluation and management service by telephone and the availability of in person appointments. I also discussed with the patient that there may be a patient responsible charge related to this service. The patient expressed understanding and verbally consented to this telephonic visit.    Interactive audio and video telecommunications were attempted between this provider and patient, however failed, due to patient having technical difficulties OR patient did not have access to video capability.  We continued and completed visit with audio only.    Review of Systems     Cardiac Risk Factors include: advanced age (>49mn, >>72women);diabetes mellitus;male gender     Objective:    Today's Vitals   There is no height or weight on file to calculate BMI.     12/02/2021    8:22 AM 02/13/2021    1:20 PM 02/13/2021   10:19 AM  Advanced Directives  Does Patient Have a Medical Advance Directive? Yes Yes Yes  Type of AParamedicof AMer[place] will [place] will Living will;Healthcare Power of Attorney  Does patient want to make changes to medical advance directive?  No - Patient declined No - Patient declined  Copy of [place] Chart? No - copy requested No - copy requested No - copy requested    Current Medications (verified) Outpatient Encounter Medications as of 12/02/2021  Medication Sig   aspirin 81 MG EC tablet Take 1 tablet (81 mg total) by mouth daily. (Patient taking  differently: Take 81 mg by mouth every evening.)   atorvastatin (LIPITOR) 80 MG tablet Take 1 tablet (80 mg total) by mouth daily.   dutasteride (AVODART) 0.5 MG capsule Take 1 capsule (0.5 mg total) by mouth daily.   empagliflozin (JARDIANCE) 10 MG TABS tablet TAKE ONE TABLET BY MOUTH DAILY BEFORE BREAKFAST   hydrochlorothiazide (HYDRODIURIL) 25 MG tablet Take 1 tablet (25 mg total) by mouth daily.   lisinopril (ZESTRIL) 20 MG tablet Take 1 tablet (20 mg total) by mouth daily.   metoprolol tartrate (LOPRESSOR) 25 MG tablet Take 1 tablet (25 mg total) by mouth 2 (two) times daily.   Multiple Vitamins-Minerals (EYE MULTIVITAMIN) CAPS Take by mouth in the morning and at bedtime.   No facility-administered encounter medications on file as of 12/02/2021.    Allergies (verified) Patient has no known allergies.   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 USING RIGHT GSV AND LIMA.;  Surgeon: LLajuana Matte MD;  [facility]  Service: Open Heart Surgery;  Laterality: N/A;   ENDOVEIN HARVEST OF GREATER SAPHENOUS VEIN Right 02/16/2021   Procedure: ENDOVEIN HARVEST OF GREATER SAPHENOUS VEIN;  Surgeon: LLajuana Matte MD;  [place] [facility]  Service: Open Heart Surgery;  Laterality: Right;  MOHS SURGERY     SCC Rt arm   RIGHT/LEFT HEART CATH AND CORONARY ANGIOGRAPHY N/A 02/13/2021   Procedure: RIGHT/LEFT HEART CATH AND CORONARY ANGIOGRAPHY;  Surgeon: Belva Crome, MD;  Location: [facility];  Service: Cardiovascular;  Laterality: N/A;   TEE WITHOUT CARDIOVERSION N/A 02/16/2021   Procedure: TRANSESOPHAGEAL ECHOCARDIOGRAM (TEE);  Surgeon: Lajuana Matte, MD;  Location: [place];   Service: Open Heart Surgery;  Laterality: N/A;   TONSILLECTOMY     Family History  Problem Relation Age of Onset   Arthritis Mother    Cancer Father    Hypertension Father    Diabetes Sister    Stroke Brother    Social History   Socioeconomic History   Marital status: Widowed    Spouse name: Not on file   Number of children: 1   Years of education: 16   Highest education level: Bachelor's degree (e.g., BA, AB, BS)  Occupational History   Occupation: Visual merchandiser    Comment: [employer]  Tobacco Use   Smoking status: Every Day    Packs/day: 1.00    Types: Cigarettes   Smokeless tobacco: Never  Vaping Use   Vaping Use: Never used  Substance and Sexual Activity   Alcohol use: Yes    Comment: Occas   Drug use: Never   Sexual activity: Not Currently  Other Topics Concern   Not on file  Social History Narrative   Not on file   Social Determinants of Health   Financial Resource Strain: Low Risk    Difficulty of Paying Living Expenses: Not hard at all  Food Insecurity: No Food Insecurity   Worried About Charity fundraiser in the Last Year: Never true   [place] in the Last Year: Never true  Transportation Needs: No Transportation Needs   Lack of Transportation (Medical): No   Lack of Transportation (Non-Medical): No  Physical Activity: Insufficiently Active   Days of Exercise per Week: 3 days   Minutes of Exercise per Session: 30 min  Stress: No Stress Concern Present   Feeling of Stress : Not at all  Social Connections: Moderately Isolated   Frequency of Communication with Friends and Family: Twice a week   Frequency of Social Gatherings with Friends and Family: Twice a week   Attends Religious Services: More than 4 times per year   Active Member of [employer] or Organizations: No   Attends Archivist Meetings: Never   Marital Status: Widowed    Tobacco Counseling Ready to quit: Not Answered Counseling given: Not Answered   Clinical  Intake:  Pre-visit preparation completed: Yes  Pain : No/denies pain     Nutritional Risks: None Diabetes: No  How often do you need to have someone help you when you read instructions, pamphlets, or other written materials from your doctor or pharmacy?: 1 - Never What is the last grade level you completed in school?: college  Diabetic?yes Nutrition Risk Assessment:  Has the patient had any N/V/D within the last 2 months?  No  Does the patient have any non-healing wounds?  No  Has the patient had any unintentional weight loss or weight gain?  No   Diabetes:  Is the patient diabetic?  Yes  If diabetic, was a CBG obtained today?  No  Did the patient bring in their glucometer from home?  No  How often do you monitor your CBG's? Never .   Financial Strains and Diabetes Management:  Are you having any financial strains with the device, your supplies or your medication? No .  Does the patient want to be seen by Chronic Care Management for management of their diabetes?  No  Would the patient like to be referred to a Nutritionist or for Diabetic Management?  No   Diabetic Exams:  Diabetic Eye Exam: Completed 07/2021 Diabetic Foot Exam: Overdue, Pt has been advised about the importance in completing this exam. Pt is scheduled for diabetic foot exam on next office visit .   Interpreter Needed?: No  Information entered by :: L.[place]o [place],LPn   Activities of Daily Living    12/02/2021    8:24 AM 02/13/2021    1:20 PM  In your present state of health, do you have any difficulty performing the following activities:  Hearing? 0 0  Vision? 0 0  Difficulty concentrating or making decisions? 0 0  Walking or climbing stairs? 0 1  Dressing or bathing? 0 0  Doing errands, shopping? 0 0  Preparing Food and eating ? N   Using the Toilet? N   In the past six months, have you accidently leaked urine? N   Do you have problems with loss of bowel control? N   Managing your Medications? N    Managing your Finances? N   Housekeeping or managing your Housekeeping? N     Patient Care Team: Haydee Salter, MD as PCP - General (Family Medicine) Berniece Salines, DO as PCP - Cardiology (Cardiology) Berniece Salines, DO as Consulting Physician (Cardiology) Monna Fam, MD as Consulting Physician (Ophthalmology) Ulla Gallo, MD as Consulting Physician (Dermatology)  Indicate any recent Medical Services you may have received from other than [facility] providers in the past year (date may be approximate).     Assessment:   This is a routine wellness examination for Meyer.  Hearing/Vision screen No results found.  Dietary issues and exercise activities discussed: Current Exercise Habits: Home exercise routine, Type of exercise: walking, Time (Minutes): 30, Frequency (Times/Week): 3, Weekly Exercise (Minutes/Week): 90, Intensity: Mild, Exercise limited by: None identified   Goals Addressed   None    Depression Screen    12/02/2021    8:24 AM 12/02/2021    8:21 AM 10/26/2021    9:18 AM 07/08/2021   11:44 AM 04/30/2021   10:19 AM 06/18/2020    2:33 PM  PHQ 2/9 Scores  PHQ - 2 Score 0 0 0 0 0 2  PHQ- 9 Score      4    Fall Risk    12/02/2021    8:23 AM 04/30/2021   12:04 PM 01/08/2021    9:34 AM 11/21/2019    8:56 AM  Fall Risk   Falls in the past year? 0 0 0 0  Number falls in past yr: 0 0 0   Injury with Fall? 0 0 0   Comment  Fell on ice. Skinned knee. No Dr. visit    Risk for fall due to :  No Fall Risks    Follow up Falls evaluation completed Falls evaluation completed  Falls evaluation completed    [place]:  Any stairs in or around the home? No  If so, are there any without handrails? No  Home free of loose throw rugs in walkways, pet beds, electrical cords, etc? Yes  Adequate lighting in your home to reduce risk of falls? Yes   ASSISTIVE DEVICES UTILIZED TO PREVENT FALLS:  Life alert? No  Use of a cane, walker or w/c? No   Grab bars in the bathroom? No  Shower chair or bench in shower? No  Elevated toilet seat or a handicapped toilet? No     Cognitive Function:  Normal cognitive status assessed by telephone conversation by this Nurse Health Advisor. No abnormalities found.        Immunizations Immunization History  Administered Date(s) Administered   Fluad Quad(high Dose 65+) 04/06/2021   Moderna Sars-Covid-2 Vaccination 09/26/2019, 10/25/2019, 06/03/2020, 04/07/2021   Pneumococcal Conjugate-13 01/23/2021   Zoster Recombinat (Shingrix) 11/29/2019    TDAP status: Due, Education has been provided regarding the importance of this vaccine. Advised may receive this vaccine at local pharmacy or Health Dept. Aware to provide a copy of the vaccination record if obtained from local pharmacy or Health Dept. Verbalized acceptance and understanding.  Flu Vaccine status: Up to date  Pneumococcal vaccine status: Up to date  Covid-19 vaccine status: Completed vaccines  Qualifies for Shingles Vaccine? Yes   Zostavax completed No   Shingrix Completed?: No.    Education has been provided regarding the importance of this vaccine. Patient has been advised to call insurance company to determine out of pocket expense if they have not yet received this vaccine. Advised may also receive vaccine at local pharmacy or Health Dept. Verbalized acceptance and understanding.  Screening Tests Health Maintenance  Topic Date Due   TETANUS/TDAP  Never done   COLONOSCOPY (Pts 45-59yr Insurance coverage will need to be confirmed)  Never done   Zoster Vaccines- Shingrix (2 of 2) 01/24/2020   COVID-19 Vaccine (5 - Booster for Moderna series) 06/02/2021   Pneumonia Vaccine 67 Years old (2 - PPSV23 if available, else PCV20) 01/23/2022   INFLUENZA VACCINE  02/02/2022   FOOT EXAM  04/27/2022   HEMOGLOBIN A1C  04/27/2022   OPHTHALMOLOGY EXAM  07/05/2022   Hepatitis C Screening  Completed   HPV VACCINES  Aged Out    Health  Maintenance  Health Maintenance Due  Topic Date Due   TETANUS/TDAP  Never done   COLONOSCOPY (Pts 45-444yrInsurance coverage will need to be confirmed)  Never done   Zoster Vaccines- Shingrix (2 of 2) 01/24/2020   COVID-19 Vaccine (5 - Booster for Moderna series) 06/02/2021   Pneumonia Vaccine 6531Years old (2 - PPSV23 if available, else PCV20) 01/23/2022    Colorectal cancer screening: [facility] aware the office will call re: appt.  Lung Cancer Screening: (Low Dose CT Chest recommended if Age 235-80ears, 30 pack-year currently smoking OR have quit w/in 15years.) does not qualify.   Lung Cancer Screening Referral: n/a  Additional Screening:  Hepatitis C Screening: does not qualify;   Vision Screening: Recommended annual ophthalmology exams for early detection of glaucoma and other disorders of the eye. Is the patient up to date with their annual eye exam?  Yes  Who is the provider or what is the name of the office in which the patient attends annual eye exams? Dr. JoBerkley HarveyIf pt is not established with a provider, would they like to be referred to a provider to establish care? No .   Dental Screening: Recommended annual dental exams for proper oral hygiene  Community Resource Referral / Chronic Care Management: CRR required this visit?  No   CCM required this visit?  No      Plan:     I have personally reviewed and noted the following  in the patient's chart:   Medical and social history Use of alcohol, tobacco or illicit drugs  Current medications and supplements including opioid prescriptions. Patient is not currently taking opioid prescriptions. Functional ability and status Nutritional status Physical activity Advanced directives List of other physicians Hospitalizations, surgeries, and ER visits in previous 12 months Vitals Screenings to include cognitive, depression, and falls Referrals and  appointments  In addition, I have reviewed and discussed with patient certain preventive protocols, quality metrics, and best practice recommendations. A written personalized care plan for preventive services as well as general preventive health recommendations were provided to patient.     Randel Pigg, LPN   3/79/4327   Nurse Notes: none

## 2021-12-02 NOTE — Patient Instructions (Signed)
Jesus Davenport , Thank you for taking time to come for your Medicare Wellness Visit. I appreciate your ongoing commitment to your health goals. Please review the following plan we discussed and let me know if I can assist you in the future.   Screening recommendations/referrals: Colonoscopy: Patient to schedule in [facility]  Recommended yearly ophthalmology/optometry visit for glaucoma screening and checkup Recommended yearly dental visit for hygiene and checkup  Vaccinations: Influenza vaccine: completed  Pneumococcal vaccine: completed  Tdap vaccine: due  Shingles vaccine: completed     Advanced directives: yes   Conditions/risks identified: none   Next appointment: none   Preventive Care 42 Years and Older, Male Preventive care refers to lifestyle choices and visits with your health care provider that can promote health and wellness. What does preventive care include? A yearly physical exam. This is also called an annual well check. Dental exams once or twice a year. Routine eye exams. Ask your health care provider how often you should have your eyes checked. Personal lifestyle choices, including: Daily care of your teeth and gums. Regular physical activity. Eating a healthy diet. Avoiding tobacco and drug use. Limiting alcohol use. Practicing safe sex. Taking low doses of aspirin every day. Taking vitamin and mineral supplements as recommended by your health care provider. What happens during an annual well check? The services and screenings done by your health care provider during your annual well check will depend on your age, overall health, lifestyle risk factors, and family history of disease. Counseling  Your health care provider may ask you questions about your: Alcohol use. Tobacco use. Drug use. Emotional well-being. Home and relationship well-being. Sexual activity. Eating habits. History of falls. Memory and ability to understand (cognition). Work and  work Statistician. Screening  You may have the following tests or measurements: Height, weight, and BMI. Blood pressure. Lipid and cholesterol levels. These may be checked every 5 years, or more frequently if you are over 67 years old. Skin check. Lung cancer screening. You may have this screening every year starting at age 56 if you have a 30-pack-year history of smoking and currently smoke or have quit within the past 15 years. Fecal occult blood test (FOBT) of the stool. You may have this test every year starting at age 56. Flexible sigmoidoscopy or colonoscopy. You may have a sigmoidoscopy every 5 years or a colonoscopy every 10 years starting at age 26. Prostate cancer screening. Recommendations will vary depending on your family history and other risks. Hepatitis C blood test. Hepatitis B blood test. Sexually transmitted disease (STD) testing. Diabetes screening. This is done by checking your blood sugar (glucose) after you have not eaten for a while (fasting). You may have this done every 1-3 years. Abdominal aortic aneurysm (AAA) screening. You may need this if you are a current or former smoker. Osteoporosis. You may be screened starting at age 63 if you are at high risk. Talk with your health care provider about your test results, treatment options, and if necessary, the need for more tests. Vaccines  Your health care provider may recommend certain vaccines, such as: Influenza vaccine. This is recommended every year. Tetanus, diphtheria, and acellular pertussis (Tdap, Td) vaccine. You may need a Td booster every 10 years. Zoster vaccine. You may need this after age 22. Pneumococcal 13-valent conjugate (PCV13) vaccine. One dose is recommended after age 38. Pneumococcal polysaccharide (PPSV23) vaccine. One dose is recommended after age 28. Talk to your health care provider about which screenings and vaccines  you need and how often you need them. This information is not intended to  replace advice given to you by your health care provider. Make sure you discuss any questions you have with your health care provider. Document Released: 07/18/2015 Document Revised: 03/10/2016 Document Reviewed: 04/22/2015 Elsevier Interactive Patient Education  2017 [employer] Prevention in the Home Falls can cause injuries. They can happen to people of all ages. There are many things you can do to make your home safe and to help prevent falls. What can I do on the outside of my home? Regularly fix the edges of walkways and driveways and fix any cracks. Remove anything that might make you trip as you walk through a door, such as a raised step or threshold. Trim any bushes or trees on the path to your home. Use bright outdoor lighting. Clear any walking paths of anything that might make someone trip, such as rocks or tools. Regularly check to see if handrails are loose or broken. Make sure that both sides of any steps have handrails. Any raised decks and porches should have guardrails on the edges. Have any leaves, snow, or ice cleared regularly. Use sand or salt on walking paths during winter. Clean up any spills in your garage right away. This includes oil or grease spills. What can I do in the bathroom? Use night lights. Install grab bars by the toilet and in the tub and shower. Do not use towel bars as grab bars. Use non-skid mats or decals in the tub or shower. If you need to sit down in the shower, use a plastic, non-slip stool. Keep the floor dry. Clean up any water that spills on the floor as soon as it happens. Remove soap buildup in the tub or shower regularly. Attach bath mats securely with double-sided non-slip rug tape. Do not have throw rugs and other things on the floor that can make you trip. What can I do in the bedroom? Use night lights. Make sure that you have a light by your bed that is easy to reach. Do not use any sheets or blankets that are too big for  your bed. They should not hang down onto the floor. Have a firm chair that has side arms. You can use this for support while you get dressed. Do not have throw rugs and other things on the floor that can make you trip. What can I do in the kitchen? Clean up any spills right away. Avoid walking on wet floors. Keep items that you use a lot in easy-to-reach places. If you need to reach something above you, use a strong step stool that has a grab bar. Keep electrical cords out of the way. Do not use floor polish or wax that makes floors slippery. If you must use wax, use non-skid floor wax. Do not have throw rugs and other things on the floor that can make you trip. What can I do with my stairs? Do not leave any items on the stairs. Make sure that there are handrails on both sides of the stairs and use them. Fix handrails that are broken or loose. Make sure that handrails are as long as the stairways. Check any carpeting to make sure that it is firmly attached to the stairs. Fix any carpet that is loose or worn. Avoid having throw rugs at the top or bottom of the stairs. If you do have throw rugs, attach them to the floor with carpet tape. Make sure  that you have a light switch at the top of the stairs and the bottom of the stairs. If you do not have them, ask someone to add them for you. What else can I do to help prevent falls? Wear shoes that: Do not have high heels. Have rubber bottoms. Are comfortable and fit you well. Are closed at the toe. Do not wear sandals. If you use a stepladder: Make sure that it is fully opened. Do not climb a closed stepladder. Make sure that both sides of the stepladder are locked into place. Ask someone to hold it for you, if possible. Clearly mark and make sure that you can see: Any grab bars or handrails. First and last steps. Where the edge of each step is. Use tools that help you move around (mobility aids) if they are needed. These  include: Canes. Walkers. Scooters. Crutches. Turn on the lights when you go into a dark area. Replace any light bulbs as soon as they burn out. Set up your furniture so you have a clear path. Avoid moving your furniture around. If any of your floors are uneven, fix them. If there are any pets around you, be aware of where they are. Review your medicines with your doctor. Some medicines can make you feel dizzy. This can increase your chance of falling. Ask your doctor what other things that you can do to help prevent falls. This information is not intended to replace advice given to you by your health care provider. Make sure you discuss any questions you have with your health care provider. Document Released: 04/17/2009 Document Revised: 11/27/2015 Document Reviewed: 07/26/2014 Elsevier Interactive Patient Education  2017 [employer].

## 2022-01-11 ENCOUNTER — Ambulatory Visit: Payer: Medicare Other | Admitting: Family Medicine

## 2022-01-12 ENCOUNTER — Telehealth: Payer: Self-pay | Admitting: Family Medicine

## 2022-01-12 DIAGNOSIS — N400 Enlarged prostate without lower urinary tract symptoms: Secondary | ICD-10-CM

## 2022-01-12 DIAGNOSIS — I2510 Atherosclerotic heart disease of native coronary artery without angina pectoris: Secondary | ICD-10-CM

## 2022-01-12 DIAGNOSIS — E785 Hyperlipidemia, unspecified: Secondary | ICD-10-CM

## 2022-01-12 MED ORDER — METOPROLOL TARTRATE 25 MG PO TABS: 25.0000 mg | ORAL_TABLET | Freq: Two times a day (BID) | ORAL | 3 refills | Status: DC

## 2022-01-12 MED ORDER — ATORVASTATIN CALCIUM 80 MG PO TABS: 80.0000 mg | ORAL_TABLET | Freq: Every day | ORAL | 3 refills | Status: DC

## 2022-01-12 MED ORDER — DUTASTERIDE 0.5 MG PO CAPS: 0.5000 mg | ORAL_CAPSULE | Freq: Every day | ORAL | 1 refills | Status: DC

## 2022-01-12 NOTE — Telephone Encounter (Signed)
Please review and advise. Thanks. Dm/cma  

## 2022-01-12 NOTE — Telephone Encounter (Signed)
Pt was a no show 01/11/2022 for an OV with Dr. Gena Fray, I have sent out a no show letter

## 2022-01-12 NOTE — Telephone Encounter (Signed)
Caller Name: Laurey Arrow Call back phone #: [phone]  MEDICATION(S):  dutasteride (AVODART) 0.5 MG capsule - will be out in 1 week, no refills  atorvastatin (LIPITOR) 80 MG tablet - no refills, has a few weeks but wanting Dr. Gena Fray to fill  metoprolol tartrate (LOPRESSOR) 25 MG tablet - no refills, has a few weeks but wanting Dr. Gena Fray to fill  [place]. Pt asking if he needs appt now or if he can wait for about 3 months to come back to see Dr. Gena Fray.  Preferred Pharmacy: [id number] [place], [place]

## 2022-01-14 NOTE — Telephone Encounter (Signed)
Spoke to patient and he will call back when he has his schedule to make an appointment.  Dm/cma

## 2022-01-25 ENCOUNTER — Ambulatory Visit: Payer: Medicare Other | Admitting: Family Medicine

## 2022-02-05 ENCOUNTER — Other Ambulatory Visit: Payer: Self-pay | Admitting: *Deleted

## 2022-02-05 DIAGNOSIS — I7121 Aneurysm of the ascending aorta, without rupture: Secondary | ICD-10-CM

## 2022-02-05 NOTE — Telephone Encounter (Signed)
1st no show, fee waived, letter sent 

## 2022-02-26 ENCOUNTER — Encounter: Payer: Self-pay | Admitting: Family Medicine

## 2022-02-26 ENCOUNTER — Ambulatory Visit (INDEPENDENT_AMBULATORY_CARE_PROVIDER_SITE_OTHER): Payer: Medicare Other | Admitting: Family Medicine

## 2022-02-26 VITALS — BP 152/84 | HR 79 | Temp 97.9°F | Ht 68.0 in | Wt 192.8 lb

## 2022-02-26 DIAGNOSIS — I7121 Aneurysm of the ascending aorta, without rupture: Secondary | ICD-10-CM | POA: Diagnosis not present

## 2022-02-26 DIAGNOSIS — Z23 Encounter for immunization: Secondary | ICD-10-CM | POA: Diagnosis not present

## 2022-02-26 DIAGNOSIS — I10 Essential (primary) hypertension: Secondary | ICD-10-CM

## 2022-02-26 DIAGNOSIS — E782 Mixed hyperlipidemia: Secondary | ICD-10-CM | POA: Diagnosis not present

## 2022-02-26 DIAGNOSIS — E1159 Type 2 diabetes mellitus with other circulatory complications: Secondary | ICD-10-CM | POA: Diagnosis not present

## 2022-02-26 DIAGNOSIS — I2510 Atherosclerotic heart disease of native coronary artery without angina pectoris: Secondary | ICD-10-CM | POA: Diagnosis not present

## 2022-02-26 DIAGNOSIS — Z1211 Encounter for screening for malignant neoplasm of colon: Secondary | ICD-10-CM

## 2022-02-26 LAB — GLUCOSE, RANDOM: Glucose, Bld: 111 mg/dL — ABNORMAL HIGH (ref 70–99)

## 2022-02-26 LAB — LIPID PANEL
Cholesterol: 131 mg/dL (ref 0–200)
HDL: 41.7 mg/dL (ref >39.00)
LDL Cholesterol: 67 mg/dL (ref 0–99)
NonHDL: 88.85
Total CHOL/HDL Ratio: 3
Triglycerides: 111 mg/dL (ref 0.0–149.0)
VLDL: 22.2 mg/dL (ref 0.0–40.0)

## 2022-02-26 LAB — HEMOGLOBIN A1C: Hgb A1c MFr Bld: 6.9 % — ABNORMAL HIGH (ref 4.6–6.5)

## 2022-02-26 NOTE — Addendum Note (Signed)
Addended by: Haydee Salter on: 02/26/2022 04:42 PM   Modules accepted: Orders

## 2022-02-26 NOTE — Progress Notes (Signed)
[facility] [address] [place] [address] Dept: [phone] Dept Fax: [phone]  Chronic Care Office Visit  Subjective:    Patient ID: Jesus MATSUMOTO Sr., male    DOB: Nov 13, 1955, 66 y.o..   MRN: [id number]  Chief Complaint  Patient presents with   Follow-up    3 month F/u. No concerns.   Not fasting (had coffee with cream/sweetener).     History of Present Illness:  Patient is in today for reassessment of chronic medical issues.  Jesus Davenport has a history of CAD and underwent a 2-vessel CABG in 02/2021. He had some associated CHF. He was also noted to have a 4.5 cm dilated ascending aortic aneurysm. He is working with Jesus Davenport (cardiology). His is on aspirin 81 mg daily and metoprolol 25 mg bid for his heart.    Jesus Davenport has hypertension, managed on lisinopril 20 mg and HCTZ 25 mg daily. He notes he did not take his medication this morning prior to his appointment. He does have the ability to monitor his BP at home.   Jesus Davenport has hyperlipidemia. This is managed with atorvastatin 80 mg daily.   Jesus Davenport has a history of Type 2 diabetes. He is currently managed on Jardiance 10 mg daily.   Jesus Davenport relates an Urgent Care/ED visit 2 months ago for a GI issue. He ended up with a cardiac workup and a CT scan. He had asked for records to be sent here, so is disappointed that we have not seen these. He notes the GI issues have resolved.  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/2021   CAD (coronary artery disease) 02/13/2021   Type 2 diabetes mellitus with cardiac  complication ([place]) 25/11/3974   Essential hypertension 11/22/2019   BPH with elevated PSA 11/22/2019   Squamous cell carcinoma of arm, 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   Cervical arthritis 11/22/2019   DDD (degenerative disc disease), lumbar 11/22/2019   Past Surgical History:  Procedure Laterality Date   APPENDECTOMY     66yo   CORONARY ARTERY BYPASS GRAFT N/A 02/16/2021   Procedure: CORONARY ARTERY BYPASS GRAFTING (CABG)x 2 ON CARDIOPULMONARY BYPASS USING RIGHT GSV AND LIMA.;  Surgeon: Jesus Matte, MD;  Location: [facility];  Service: Open Heart Surgery;  Laterality: N/A;   ENDOVEIN HARVEST OF GREATER SAPHENOUS VEIN Right 02/16/2021   Procedure: ENDOVEIN HARVEST OF GREATER SAPHENOUS VEIN;  Surgeon: Jesus Matte, MD;  Location: [facility];  Service: Open Heart Surgery;  Laterality: Right;   MOHS SURGERY     SCC Rt arm   RIGHT/LEFT HEART CATH AND CORONARY ANGIOGRAPHY N/A 02/13/2021   Procedure: RIGHT/LEFT HEART CATH AND CORONARY ANGIOGRAPHY;  Surgeon: Jesus Crome, MD;  Location: [facility];  Service: Cardiovascular;  Laterality: N/A;   TEE WITHOUT CARDIOVERSION N/A 02/16/2021   Procedure: TRANSESOPHAGEAL ECHOCARDIOGRAM (TEE);  Surgeon: Jesus Matte, MD;  Location: [facility];  Service: Open Heart Surgery;  Laterality: N/A;   TONSILLECTOMY     Family History  Problem Relation Age of Onset   Arthritis Mother    Cancer Father    Hypertension  Father    Diabetes Sister    Stroke Brother    Outpatient Medications Prior to Visit  Medication Sig Dispense Refill   aspirin 81 MG EC tablet Take 1 tablet (81 mg total) by mouth daily. (Patient taking differently: Take 81 mg by mouth every evening.) 90 tablet 3   atorvastatin (LIPITOR) 80 MG tablet Take 1 tablet (80 mg total) by mouth daily. 90 tablet 3   dutasteride (AVODART) 0.5 MG capsule Take 1 capsule (0.5 mg total) by mouth daily. 90 capsule 1   empagliflozin (JARDIANCE) 10 MG TABS tablet TAKE ONE TABLET BY MOUTH DAILY BEFORE BREAKFAST  90 tablet 1   hydrochlorothiazide (HYDRODIURIL) 25 MG tablet Take 1 tablet (25 mg total) by mouth daily. 90 tablet 3   lisinopril (ZESTRIL) 20 MG tablet Take 1 tablet (20 mg total) by mouth daily. 90 tablet 3   metoprolol tartrate (LOPRESSOR) 25 MG tablet Take 1 tablet (25 mg total) by mouth 2 (two) times daily. 180 tablet 3   Multiple Vitamins-Minerals (EYE MULTIVITAMIN) CAPS Take by mouth in the morning and at bedtime.     No facility-administered medications prior to visit.   No Known Allergies    Objective:   Today's Vitals   02/26/22 0756 02/26/22 0758  BP: (!) 150/80 (!) 152/84  Pulse: 79   Temp: 97.9 F (36.6 C)   TempSrc: Temporal   SpO2: 92%   Weight: 192 lb 12.8 oz (87.5 kg)   Height: '5\' 8"'$  (1.727 m)    Body mass index is 29.32 kg/m.   General: Well developed, well nourished. No acute distress. CV: RRR without murmurs or rubs. Pulses 2+ bilaterally. Psych: Alert and oriented. Normal mood and affect.  Health Maintenance Due  Topic Date Due   TETANUS/TDAP  Never done   COLONOSCOPY (Pts 45-58yr Insurance coverage will need to be confirmed)  Never done   Zoster Vaccines- Shingrix (2 of 2) 01/24/2020   Pneumonia Vaccine 67 Years old (2 - PPSV23 or PCV20) 03/20/2021   INFLUENZA VACCINE  02/02/2022   Lab Results Last lipids Lab Results  Component Value Date   CHOL 128 02/15/2021   HDL 48 02/15/2021   LDLCALC 66 02/15/2021   LDLDIRECT 102.0 06/18/2020   TRIG 70 02/15/2021   CHOLHDL 2.7 02/15/2021   Last hemoglobin A1c Lab Results  Component Value Date   HGBA1C 6.8 (H) 10/26/2021     Assessment & Plan:   1. Type 2 diabetes mellitus with cardiac complication (HCC) Diabetes has been at goal. I will check Mr. SScrimaA1c today. Plan to continue Jardiance 10 mg daily.  - Glucose, random - Hemoglobin A1c  2. Essential hypertension [name] blood pressure is high today, but he did not take his medicine. I recommend he try to always take this prior to  his appointments. He should check his pressures at home and let me know if they are not at least in 1937Jsystolic. Continue lisinopril 20 mg daily and HCTZ 25 mg daily.  3. Coronary artery disease involving native coronary artery of native heart without angina pectoris Stable. No chest pain. Continue metoprolol 25 mg bid. - Lipid panel  4. Aneurysm of ascending aorta without rupture ([place] CTA is scheduled.  5. Mixed hyperlipidemia We will recheck lipids. Plan to continue atorvastatin 80 mg daily. - Lipid panel   Return in about 3 months (around 05/29/2022) for Reassessment.   SHaydee Salter MD

## 2022-03-08 ENCOUNTER — Other Ambulatory Visit: Payer: Self-pay | Admitting: Family Medicine

## 2022-03-08 DIAGNOSIS — E1169 Type 2 diabetes mellitus with other specified complication: Secondary | ICD-10-CM

## 2022-03-11 ENCOUNTER — Other Ambulatory Visit: Payer: Self-pay | Admitting: Physician Assistant

## 2022-03-19 ENCOUNTER — Encounter: Payer: Medicare Other | Admitting: Thoracic Surgery (Cardiothoracic Vascular Surgery)

## 2022-03-19 ENCOUNTER — Other Ambulatory Visit: Payer: Medicare Other

## 2022-03-26 ENCOUNTER — Ambulatory Visit (INDEPENDENT_AMBULATORY_CARE_PROVIDER_SITE_OTHER): Payer: Medicare Other | Admitting: Thoracic Surgery (Cardiothoracic Vascular Surgery)

## 2022-03-26 DIAGNOSIS — I7121 Aneurysm of the ascending aorta, without rupture: Secondary | ICD-10-CM | POA: Diagnosis not present

## 2022-03-26 NOTE — Progress Notes (Signed)
     [address]       [place] [address]             [phone]       Patient: Home Provider: Office Consent for Telemedicine visit obtained.  Today's visit was completed via a real-time telehealth (see specific modality noted below). The patient/authorized person provided oral consent at the time of the visit to engage in a telemedicine encounter with the present provider at [facility]. The patient/authorized person was informed of the potential benefits, limitations, and risks of telemedicine. The patient/authorized person expressed understanding that the laws that protect confidentiality also apply to telemedicine. The patient/authorized person acknowledged understanding that telemedicine does not provide emergency services and that he or she would need to call 911 or proceed to the nearest hospital for help if such a need arose.   Total time spent in the clinical discussion 10 minutes.  Telehealth Modality: Phone visit (audio only)    I had a telephone visit with Mr. Stroot.  He is status post CABG in 2022 and has a known 4.5 cm ascending aortic aneurysm.  He was scheduled to undergo a CT scan of the chest but this was not performed.  He continues to have elevated blood pressures with systolics in the 794I to 016P.  I instructed him to reach out to his primary care physician about tighter blood pressure control.  He will undergo another CT scan of the chest and February 2024, and can follow-up virtually to discuss the results.  Of note he has moved to [facility], but all of his physicians are in [place].    Gwendolynn Merkey Bary Leriche

## 2022-03-30 NOTE — Patient Instructions (Addendum)
  Plan: Avoid bending, stooping and avoid lifting weights greater than 10 lbs. Avoid prolong standing and walking. Avoid frequent bending and stooping  No lifting greater than 10 lbs. May use ice or moist heat for pain. Weight loss is of benefit. Handicap license is approved. Epidural steroids are sometimes helpful in relieving or improving pain due to spinal stenosis. Call if you wish to consider injection treatment.    Diclofenac for arthritis and sciatica.

## 2022-05-19 ENCOUNTER — Telehealth: Payer: Self-pay | Admitting: *Deleted

## 2022-05-19 NOTE — Telephone Encounter (Signed)
Pt. Scheduled for procedure 07/09/22,per TE pt.should wait 6 months after his surgery to allow for healing please advise on how to proceed ?

## 2022-05-21 NOTE — Telephone Encounter (Signed)
noted 

## 2022-05-31 ENCOUNTER — Ambulatory Visit (AMBULATORY_SURGERY_CENTER): Payer: Self-pay | Admitting: *Deleted

## 2022-05-31 ENCOUNTER — Ambulatory Visit (INDEPENDENT_AMBULATORY_CARE_PROVIDER_SITE_OTHER): Payer: Medicare Other | Admitting: Family Medicine

## 2022-05-31 ENCOUNTER — Telehealth: Payer: Self-pay

## 2022-05-31 ENCOUNTER — Encounter: Payer: Self-pay | Admitting: Family Medicine

## 2022-05-31 VITALS — Ht 68.0 in | Wt 193.0 lb

## 2022-05-31 VITALS — BP 142/88 | HR 69 | Temp 97.6°F | Ht 68.0 in | Wt 192.4 lb

## 2022-05-31 DIAGNOSIS — Z720 Tobacco use: Secondary | ICD-10-CM

## 2022-05-31 DIAGNOSIS — D751 Secondary polycythemia: Secondary | ICD-10-CM

## 2022-05-31 DIAGNOSIS — I10 Essential (primary) hypertension: Secondary | ICD-10-CM

## 2022-05-31 DIAGNOSIS — E1159 Type 2 diabetes mellitus with other circulatory complications: Secondary | ICD-10-CM

## 2022-05-31 DIAGNOSIS — F17200 Nicotine dependence, unspecified, uncomplicated: Secondary | ICD-10-CM | POA: Diagnosis not present

## 2022-05-31 DIAGNOSIS — E782 Mixed hyperlipidemia: Secondary | ICD-10-CM

## 2022-05-31 DIAGNOSIS — R42 Dizziness and giddiness: Secondary | ICD-10-CM

## 2022-05-31 DIAGNOSIS — I6523 Occlusion and stenosis of bilateral carotid arteries: Secondary | ICD-10-CM | POA: Insufficient documentation

## 2022-05-31 DIAGNOSIS — N400 Enlarged prostate without lower urinary tract symptoms: Secondary | ICD-10-CM

## 2022-05-31 DIAGNOSIS — I2510 Atherosclerotic heart disease of native coronary artery without angina pectoris: Secondary | ICD-10-CM

## 2022-05-31 DIAGNOSIS — R972 Elevated prostate specific antigen [PSA]: Secondary | ICD-10-CM

## 2022-05-31 DIAGNOSIS — Z1211 Encounter for screening for malignant neoplasm of colon: Secondary | ICD-10-CM

## 2022-05-31 DIAGNOSIS — I7121 Aneurysm of the ascending aorta, without rupture: Secondary | ICD-10-CM

## 2022-05-31 LAB — CBC WITH DIFFERENTIAL/PLATELET
Basophils Absolute: 0 10*3/uL (ref 0.0–0.1)
Basophils Relative: 0.4 % (ref 0.0–3.0)
Eosinophils Absolute: 0.2 10*3/uL (ref 0.0–0.7)
Eosinophils Relative: 2.4 % (ref 0.0–5.0)
HCT: 56.5 % — ABNORMAL HIGH (ref 39.0–52.0)
Hemoglobin: 19 g/dL (ref 13.0–17.0)
Lymphocytes Relative: 22.5 % (ref 12.0–46.0)
Lymphs Abs: 2 10*3/uL (ref 0.7–4.0)
MCHC: 33.6 g/dL (ref 30.0–36.0)
MCV: 82.4 fl (ref 78.0–100.0)
Monocytes Absolute: 0.7 10*3/uL (ref 0.1–1.0)
Monocytes Relative: 7.3 % (ref 3.0–12.0)
Neutro Abs: 6.1 10*3/uL (ref 1.4–7.7)
Neutrophils Relative %: 67.4 % (ref 43.0–77.0)
Platelets: 221 10*3/uL (ref 150.0–400.0)
RBC: 6.86 Mil/uL — ABNORMAL HIGH (ref 4.22–5.81)
RDW: 18 % — ABNORMAL HIGH (ref 11.5–15.5)
WBC: 9 10*3/uL (ref 4.0–10.5)

## 2022-05-31 LAB — URINALYSIS, ROUTINE W REFLEX MICROSCOPIC
Bilirubin Urine: NEGATIVE
Hgb urine dipstick: NEGATIVE
Ketones, ur: NEGATIVE
Leukocytes,Ua: NEGATIVE
Nitrite: NEGATIVE
Specific Gravity, Urine: 1.01 (ref 1.000–1.030)
Total Protein, Urine: NEGATIVE
Urine Glucose: >=1000 — AB
Urobilinogen, UA: 0.2 (ref 0.0–1.0)
pH: 7 (ref 5.0–8.0)

## 2022-05-31 LAB — LIPID PANEL
Cholesterol: 165 mg/dL (ref 0–200)
HDL: 48.2 mg/dL (ref >39.00)
LDL Cholesterol: 85 mg/dL (ref 0–99)
NonHDL: 116.95
Total CHOL/HDL Ratio: 3
Triglycerides: 159 mg/dL — ABNORMAL HIGH (ref 0.0–149.0)
VLDL: 31.8 mg/dL (ref 0.0–40.0)

## 2022-05-31 LAB — BASIC METABOLIC PANEL
BUN: 10 mg/dL (ref 6–23)
CO2: 32 mEq/L (ref 19–32)
Calcium: 9.2 mg/dL (ref 8.4–10.5)
Chloride: 94 mEq/L — ABNORMAL LOW (ref 96–112)
Creatinine, Ser: 0.79 mg/dL (ref 0.40–1.50)
GFR: 92.02 mL/min (ref >60.00)
Glucose, Bld: 103 mg/dL — ABNORMAL HIGH (ref 70–99)
Potassium: 3.9 mEq/L (ref 3.5–5.1)
Sodium: 133 mEq/L — ABNORMAL LOW (ref 135–145)

## 2022-05-31 LAB — HEMOGLOBIN A1C: Hgb A1c MFr Bld: 7 % — ABNORMAL HIGH (ref 4.6–6.5)

## 2022-05-31 LAB — MICROALBUMIN / CREATININE URINE RATIO
Creatinine,U: 32.5 mg/dL
Microalb Creat Ratio: 26.3 mg/g (ref 0.0–30.0)
Microalb, Ur: 8.6 mg/dL — ABNORMAL HIGH (ref 0.0–1.9)

## 2022-05-31 MED ORDER — NA SULFATE-K SULFATE-MG SULF 17.5-3.13-1.6 GM/177ML PO SOLN: 1.0000 | Freq: Once | ORAL | 0 refills | Status: AC

## 2022-05-31 MED ORDER — DUTASTERIDE 0.5 MG PO CAPS: 0.5000 mg | ORAL_CAPSULE | Freq: Every day | ORAL | 1 refills | Status: DC

## 2022-05-31 MED ORDER — LISINOPRIL 40 MG PO TABS: 40.0000 mg | ORAL_TABLET | Freq: Every day | ORAL | 3 refills | Status: DC

## 2022-05-31 NOTE — Telephone Encounter (Signed)
[facility]  Caller: Earnest Bailey, [facility]   Receiver: Carolee Channell L. CMA/CPT   Time: 12:25 pm   Critical Value: Hemoglobin 19.0    Please advise pt

## 2022-05-31 NOTE — Addendum Note (Signed)
Addended by: Haydee Salter on: 05/31/2022 04:52 PM   Modules accepted: Orders

## 2022-05-31 NOTE — Progress Notes (Signed)
[facility] [address] [place] [address] Dept: [phone] Dept Fax: [facility] Follow-up Office Visit  Subjective:    Patient ID: Jesus Davenport., male    DOB: 1956/01/07, 66 y.o..   MRN: [id number]  Chief Complaint  Patient presents with   Hospitalization [facility] f/u from , dizziness/off- balance, blood counts were elevated/elevated BP.      History of Present Illness:  Patient is in today for reassessment after a recent hospitalization. Mr. Mikes was admitted at [facility] from 11/17-11/18/2023. He had been experiencing 3 days of dizziness. He underwent head CT, MRI, and carotid ultrasounds. The scans were negative. The ultrasound showed bilateral carotid stenosis, but < 50%. He was found to have polycythemia, likely secondary to his smoking. Since returning home, he has felt improved. He does feel he may have been dehydrated around that time, as he was traveling.  Mr. Noxon has a history of CAD and underwent a 2-vessel CABG in 02/2021. He had some associated CHF. He was also noted to have a 4.5 cm dilated ascending aortic aneurysm. He is working with Dr. Harriet Masson (cardiology) and Dr. Kipp Brood (CT surgery). His is on aspirin 81 mg daily and metoprolol tartrate 25 mg bid for his heart.    Mr. Rio has hypertension, managed on lisinopril 20 mg and HCTZ 25 mg daily.    Mr. Gros has hyperlipidemia. This is managed with atorvastatin 80 mg daily.   Mr. Schramm has a history of Type 2 diabetes. He is currently managed on Jardiance 10 mg daily. His A1c at the hospital was 6.2%.  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/2021   CAD (coronary artery disease) 02/13/2021   Type 2 diabetes mellitus with cardiac complication ([place]) 41/32/4401   Essential hypertension 11/22/2019   BPH with elevated PSA 11/22/2019   Squamous cell carcinoma of arm, 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   Cervical arthritis 11/22/2019   DDD (degenerative disc disease), lumbar 11/22/2019   Past Surgical History:  Procedure Laterality Date   APPENDECTOMY     66yo   CORONARY ARTERY BYPASS GRAFT N/A 02/16/2021   Procedure: CORONARY ARTERY BYPASS GRAFTING (CABG)x 2 ON CARDIOPULMONARY BYPASS USING RIGHT GSV AND LIMA.;  Surgeon: Lajuana Matte, MD;  Location: [facility];  Service: Open Heart Surgery;  Laterality: N/A;   ENDOVEIN HARVEST OF GREATER SAPHENOUS VEIN Right 02/16/2021   Procedure: ENDOVEIN HARVEST OF GREATER SAPHENOUS VEIN;  Surgeon: Lajuana Matte, MD;  Location: [facility];  Service: Open Heart Surgery;  Laterality: Right;   MOHS SURGERY     SCC Rt arm   RIGHT/LEFT HEART CATH AND CORONARY ANGIOGRAPHY N/A 02/13/2021   Procedure: RIGHT/LEFT HEART CATH AND CORONARY ANGIOGRAPHY;  Surgeon: Belva Crome, MD;  Location: [facility];  Service: Cardiovascular;  Laterality: N/A;   TEE WITHOUT CARDIOVERSION N/A 02/16/2021   Procedure: TRANSESOPHAGEAL ECHOCARDIOGRAM (TEE);  Surgeon: Lajuana Matte, MD;  Location: [facility];  Service: Open Heart Surgery;  Laterality: N/A;   TONSILLECTOMY     Family History  Problem Relation Age of Onset  Arthritis Mother    Cancer Father    Hypertension Father    Diabetes Sister    Stroke Brother    Colon cancer Neg Hx    Colon polyps Neg Hx    Crohn's disease Neg Hx    Esophageal cancer Neg Hx    Rectal cancer Neg Hx    Stomach cancer Neg Hx    Ulcerative colitis Neg Hx    Outpatient Medications Prior to Visit  Medication Sig Dispense Refill   aspirin 81 MG EC tablet Take 1 tablet (81 mg total)  by mouth daily. (Patient taking differently: Take 81 mg by mouth every evening.) 90 tablet 3   atorvastatin (LIPITOR) 80 MG tablet Take 1 tablet (80 mg total) by mouth daily. 90 tablet 3   dicyclomine (BENTYL) 10 MG capsule Take 10 mg by mouth 4 (four) times daily. prn     hydrochlorothiazide (HYDRODIURIL) 25 MG tablet Take 1 tablet (25 mg total) by mouth daily. 90 tablet 3   JARDIANCE 10 MG TABS tablet TAKE 1 TABLET BY MOUTH EVERY DAY BEFORE BREAKFAST 90 tablet 3   metoprolol tartrate (LOPRESSOR) 25 MG tablet Take 1 tablet (25 mg total) by mouth 2 (two) times daily. 180 tablet 3   Multiple Vitamins-Minerals (EYE MULTIVITAMIN) CAPS Take by mouth in the morning and at bedtime.     omeprazole (PRILOSEC) 20 MG capsule Take 20 mg by mouth daily.     ondansetron (ZOFRAN) 4 MG tablet Take 4 mg by mouth every 8 (eight) hours as needed for nausea or vomiting.     dutasteride (AVODART) 0.5 MG capsule Take 1 capsule (0.5 mg total) by mouth daily. 90 capsule 1   lisinopril (ZESTRIL) 20 MG tablet Take 1 tablet (20 mg total) by mouth daily. 90 tablet 3   Na Sulfate-K Sulfate-Mg Sulf 17.5-3.13-1.6 GM/177ML SOLN Take 1 kit by mouth once for 1 dose. May use generic- NO PA"s (Patient not taking: Reported on 05/31/2022) 354 mL 0   No facility-administered medications prior to visit.   No Known Allergies    Objective:   Today's Vitals   05/31/22 0936  BP: (!) 158/96  Pulse: 69  Temp: 97.6 F (36.4 C)  TempSrc: Temporal  SpO2: 97%  Weight: 192 lb 6.4 oz (87.3 kg)  Height: _0  (1.727 m)   Body mass index is 29.25 kg/m.   General: Well developed, well nourished. No acute distress. Skin: There is a 2-3 mm round lesion on the left lower leg, anove the ankle. This appears to   involve some bleeding into the skin. No ulceration present. Feet- Skin intact. No sign of maceration between toes. Nails are normal. Dorsalis pedis and   posterior tibial artery pulses are normal. 5.07 monofilament testing  normal. Psych: Alert and oriented. Normal mood and affect.  Health Maintenance Due  Topic Date Due   COLONOSCOPY (Pts 45-52yr Insurance coverage will need to be confirmed)  Never done   Zoster Vaccines- Shingrix (2 of 2) 01/24/2020   INFLUENZA VACCINE  02/02/2022   Diabetic kidney evaluation - GFR measurement  03/12/2022   Diabetic kidney evaluation - Urine ACR  04/27/2022   Lab Results (05/22/2022)  Hemoglobin A1c: 6.2 %  Comprehensive Metabolic Panel  Sodium: 1144mEq/L  Potassium: 3.3 mEq/L  Chloride: 95 mEq/L  CO2:  31 mmol/L  Glucose: 172 mg/dL  BUN:  18 mg/dL  Creatinine: 0.9 mg/dL  eGFR:  94 mL/min/1.754m Complete Blood Count:  WBC:  11.4 K/uL  RBC:  6.77 M/mm3  Hemoglobin: 19.2 gm/dL  Hematocrit: 57.5 %  MCV:  81.9 fL  MCH:  27.3 pg  MCHC: 33.3 g/dL  RDW:  18.3 fL  Platelets: 220 K/cumm     Assessment & Plan:   1. Dizziness Resolved at present. Likely was due to dehydration. I will reassess the BMP today.  2. Polycythemia Possibly worsened by dehydration, but there likely is a degree of this related to his tobacco use. He notes he has significantly cut his smoking back and is looking to quit again.  - CBC with Differential/Platelet  3. Essential hypertension Blood pressure is elevated. This has been persistently high over several visits. I recommend we increase his lisinopril dose.  - lisinopril (ZESTRIL) 40 MG tablet; Take 1 tablet (40 mg total) by mouth daily.  Dispense: 90 tablet; Refill: 3  4. Type 2 diabetes mellitus with cardiac complication (HCC) Due for annual DM labs. Foot exam completed. Continue empagliflozin (Jardiance) 10 mg daily.  - Microalbumin / creatinine urine ratio - Basic metabolic panel - Hemoglobin A1c - Urinalysis, Routine w reflex microscopic  5. Coronary artery disease involving native coronary artery of native heart without angina pectoris Stable. Continue aspirin 81 mg daily and metoprolol tartrate 25 mg bid. I strongly  support him stopping smoking.  6. Aneurysm of ascending aorta without rupture [facility]) Next CTA due in Feb.  7. Mixed hyperlipidemia I will reassess lipids today. Continue atorvastatin 80 mg daily. - Lipid panel  8. Tobacco use disorder Working towards cessation. We reviewed his past experience. He has been bale to quit "cold [place]" in the past and has stayed quit for up to 6 months. Discussed triggers for this in the past. At this point, I don't feel a medication will add to his chances for success. I strongly support this effort.  I spent 4 minutes counseling the patient about tobacco cessation.  9. BPH with elevated PSA Continue Avodart  - dutasteride (AVODART) 0.5 MG capsule; Take 1 capsule (0.5 mg total) by mouth daily.  Dispense: 90 capsule; Refill: 1   Return in about 3 months (around 08/31/2022) for Reassessment.   Haydee Salter, MD

## 2022-05-31 NOTE — Progress Notes (Signed)

## 2022-06-01 ENCOUNTER — Telehealth: Payer: Self-pay | Admitting: Family Medicine

## 2022-06-01 ENCOUNTER — Encounter: Payer: Self-pay | Admitting: Family Medicine

## 2022-06-01 DIAGNOSIS — D751 Secondary polycythemia: Secondary | ICD-10-CM

## 2022-06-01 NOTE — Telephone Encounter (Signed)
Pt would like you to give him a call . Pt stated he need a [place] in [place]

## 2022-06-02 NOTE — Telephone Encounter (Signed)
[facility] pages 1-13 and then it tries again.  Are you having problems with your faxing?

## 2022-06-02 NOTE — Telephone Encounter (Signed)
This has been faxed to the office

## 2022-06-02 NOTE — Telephone Encounter (Signed)
Jesus Davenport has been contacted and his referral has been sent

## 2022-06-03 NOTE — Telephone Encounter (Signed)
Printed referral, OV notes and labs and faxed to number provided.  Dm/cma

## 2022-06-11 ENCOUNTER — Encounter: Payer: Self-pay | Admitting: Family Medicine

## 2022-06-14 ENCOUNTER — Encounter: Payer: Self-pay | Admitting: Family Medicine

## 2022-06-16 ENCOUNTER — Encounter: Payer: Medicare Other | Admitting: Gastroenterology

## 2022-06-22 ENCOUNTER — Encounter: Payer: Medicare Other | Admitting: Gastroenterology

## 2022-07-02 ENCOUNTER — Telehealth: Payer: Self-pay

## 2022-07-02 NOTE — Telephone Encounter (Signed)
Patient called into the answering service today.  Called and spoke with patient. He states that he would like to move his appt to 08/30/2022 since he will already be in [place] for another appointment that same day. Pt has already picked up his prep. I informed patient that we will move his colonoscopy to 08/30/22 at 1:30 pm, arriving at 12:30 pm. I informed patient that we will send him updated colonoscopy instructions. Pt verbalized understanding and had no concerns at the end of the call.  PV - please send updated instructions to reflect new appt date/time. Thanks

## 2022-07-07 NOTE — Telephone Encounter (Signed)
New prep instructions sent via MyChart

## 2022-07-07 NOTE — Telephone Encounter (Signed)
Spoke with pt and he is aware new instructions have been sent via [place]

## 2022-07-09 ENCOUNTER — Encounter: Payer: Medicare Other | Admitting: Gastroenterology

## 2022-07-14 ENCOUNTER — Telehealth: Payer: Self-pay | Admitting: Family Medicine

## 2022-07-14 ENCOUNTER — Other Ambulatory Visit: Payer: Self-pay | Admitting: Thoracic Surgery (Cardiothoracic Vascular Surgery)

## 2022-07-14 DIAGNOSIS — I712 Thoracic aortic aneurysm, without rupture, unspecified: Secondary | ICD-10-CM

## 2022-07-14 NOTE — Telephone Encounter (Signed)
Call pt today as soon as you can pt has a trip he is going on . In [place] and pt said it would be cold there and pt is wondering should he post pone the trip because of the past pt heart condition and his triple by pass he had

## 2022-07-14 NOTE — Telephone Encounter (Signed)
Patient wants to know if he should post-pone his trip due to extreme cold temps (-%).  He would just like to know your opinion.  Please review and advise. Dm/cma

## 2022-07-15 ENCOUNTER — Telehealth: Payer: Self-pay | Admitting: Family Medicine

## 2022-07-15 NOTE — Telephone Encounter (Signed)
Lft VM to rtn call. Dm/cma  

## 2022-07-15 NOTE — Telephone Encounter (Signed)
Pt said he missed your call please give pt a call

## 2022-07-15 NOTE — Telephone Encounter (Signed)
Patient notified VIA phone. No further questions. Dm/cma  

## 2022-07-19 ENCOUNTER — Encounter: Payer: Self-pay | Admitting: Family Medicine

## 2022-07-19 DIAGNOSIS — G4761 Periodic limb movement disorder: Secondary | ICD-10-CM | POA: Insufficient documentation

## 2022-08-26 ENCOUNTER — Other Ambulatory Visit: Payer: Medicare Other

## 2022-08-26 ENCOUNTER — Telehealth: Payer: Medicare Other

## 2022-08-30 ENCOUNTER — Encounter: Payer: Self-pay | Admitting: Family Medicine

## 2022-08-30 ENCOUNTER — Ambulatory Visit (INDEPENDENT_AMBULATORY_CARE_PROVIDER_SITE_OTHER): Payer: Medicare Other | Admitting: Family Medicine

## 2022-08-30 ENCOUNTER — Encounter: Payer: Self-pay | Admitting: Gastroenterology

## 2022-08-30 ENCOUNTER — Ambulatory Visit (AMBULATORY_SURGERY_CENTER): Payer: Medicare Other | Admitting: Gastroenterology

## 2022-08-30 VITALS — BP 134/70 | HR 74 | Temp 98.2°F | Ht 68.0 in | Wt 190.4 lb

## 2022-08-30 VITALS — BP 126/86 | HR 72 | Temp 97.5°F | Resp 21 | Ht 68.0 in | Wt 193.0 lb

## 2022-08-30 DIAGNOSIS — I7121 Aneurysm of the ascending aorta, without rupture: Secondary | ICD-10-CM

## 2022-08-30 DIAGNOSIS — D751 Secondary polycythemia: Secondary | ICD-10-CM | POA: Diagnosis not present

## 2022-08-30 DIAGNOSIS — D124 Benign neoplasm of descending colon: Secondary | ICD-10-CM

## 2022-08-30 DIAGNOSIS — I10 Essential (primary) hypertension: Secondary | ICD-10-CM | POA: Diagnosis not present

## 2022-08-30 DIAGNOSIS — D120 Benign neoplasm of cecum: Secondary | ICD-10-CM

## 2022-08-30 DIAGNOSIS — Z1211 Encounter for screening for malignant neoplasm of colon: Secondary | ICD-10-CM | POA: Diagnosis not present

## 2022-08-30 DIAGNOSIS — E1159 Type 2 diabetes mellitus with other circulatory complications: Secondary | ICD-10-CM

## 2022-08-30 DIAGNOSIS — I2510 Atherosclerotic heart disease of native coronary artery without angina pectoris: Secondary | ICD-10-CM

## 2022-08-30 DIAGNOSIS — D123 Benign neoplasm of transverse colon: Secondary | ICD-10-CM | POA: Diagnosis not present

## 2022-08-30 DIAGNOSIS — D122 Benign neoplasm of ascending colon: Secondary | ICD-10-CM

## 2022-08-30 DIAGNOSIS — E782 Mixed hyperlipidemia: Secondary | ICD-10-CM

## 2022-08-30 LAB — GLUCOSE, RANDOM: Glucose, Bld: 93 mg/dL (ref 70–99)

## 2022-08-30 LAB — HEMOGLOBIN A1C: Hgb A1c MFr Bld: 6.8 % — ABNORMAL HIGH (ref 4.6–6.5)

## 2022-08-30 MED ORDER — SODIUM CHLORIDE 0.9 % IV SOLN: 500.0000 mL | Freq: Once | INTRAVENOUS | Status: DC

## 2022-08-30 NOTE — Progress Notes (Signed)
Report to PACU, RN, vss, BBS= Clear.  

## 2022-08-30 NOTE — Assessment & Plan Note (Signed)
BP is improved and adequately managed. Continue lisinopril 40 mg and HCTZ 25 mg daily.

## 2022-08-30 NOTE — Progress Notes (Signed)
[facility] [address] [place] [address] Dept: [phone] Dept Fax: [phone]  Chronic Care Office Visit  Subjective:    Patient ID: WESTLEY SIMONELLI Sr., male    DOB: October 25, 1955, 67 y.o..   MRN: [id number]  Chief Complaint  Patient presents with   Medical Management of Chronic Issues    3 month f/u.  Fasting today.  Has colonoscopy scheduled today.    History of Present Illness:  Patient is in today for reassessment of chronic medical issues.  Mr. 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 He did have a therapeutic phlebotomy. He follows up with the hematologist next month.   Mr. Brancato has a history of CAD and underwent a 2-vessel CABG in 02/2021. He had some associated CHF. He was also noted to have a 4.5 cm dilated ascending aortic aneurysm. He is working with Dr. Harriet Masson (cardiology) and Dr. Kipp Brood (CT surgery). His is on aspirin 81 mg daily and metoprolol tartrate 25 mg bid for his heart.    Mr. Youngdahl has hypertension, managed on lisinopril 40 mg and HCTZ 25 mg daily. We increased the lisinopril at his last visit.   Mr. Gallucci has hyperlipidemia. This is managed with atorvastatin 80 mg daily.   Mr. Odham has a history of Type 2 diabetes. He is currently managed on Jardiance 10 mg daily.   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/2021   CAD (coronary artery disease) 02/13/2021   Type 2 diabetes  mellitus with cardiac complication ([place]) [id number]   Essential hypertension 11/22/2019   BPH with elevated PSA 11/22/2019   Squamous cell carcinoma of arm, 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   Cervical arthritis 11/22/2019   DDD (degenerative disc disease), lumbar 11/22/2019   Past Surgical History:  Procedure Laterality Date   APPENDECTOMY     67yo   CORONARY ARTERY BYPASS GRAFT N/A 02/16/2021   Procedure: CORONARY ARTERY BYPASS GRAFTING (CABG)x 2 ON CARDIOPULMONARY BYPASS USING RIGHT GSV AND LIMA.;  Surgeon: Lajuana Matte, MD;  Location: [facility];  Service: Open Heart Surgery;  Laterality: N/A;   ENDOVEIN HARVEST OF GREATER SAPHENOUS VEIN Right 02/16/2021   Procedure: ENDOVEIN HARVEST OF GREATER SAPHENOUS VEIN;  Surgeon: Lajuana Matte, MD;  Location: [facility];  Service: Open Heart Surgery;  Laterality: Right;   MOHS SURGERY     SCC Rt arm   RIGHT/LEFT HEART CATH AND CORONARY ANGIOGRAPHY N/A 02/13/2021   Procedure: RIGHT/LEFT HEART CATH AND CORONARY ANGIOGRAPHY;  Surgeon: Belva Crome, MD;  Location: [facility];  Service: Cardiovascular;  Laterality: N/A;   TEE WITHOUT CARDIOVERSION N/A 02/16/2021   Procedure: TRANSESOPHAGEAL ECHOCARDIOGRAM (TEE);  Surgeon: Lajuana Matte, MD;  Location: [facility];  Service: Open Heart Surgery;  Laterality: N/A;   TONSILLECTOMY     Family History  Problem Relation Age of Onset   Arthritis Mother    Cancer Father    Hypertension Father    Diabetes Sister    Stroke Brother  Colon cancer Neg Hx    Colon polyps Neg Hx    Crohn's disease Neg Hx    Esophageal cancer Neg Hx    Rectal cancer Neg Hx    Stomach cancer Neg Hx    Ulcerative colitis Neg Hx    Outpatient Medications Prior to Visit  Medication Sig Dispense Refill   aspirin 81 MG EC tablet Take 1 tablet (81 mg total) by mouth daily. (Patient taking differently: Take 81 mg by mouth every evening.) 90 tablet 3   atorvastatin (LIPITOR) 80 MG tablet Take 1 tablet (80 mg total) by mouth daily.  90 tablet 3   dutasteride (AVODART) 0.5 MG capsule Take 1 capsule (0.5 mg total) by mouth daily. 90 capsule 1   hydrochlorothiazide (HYDRODIURIL) 25 MG tablet Take 1 tablet (25 mg total) by mouth daily. 90 tablet 3   JARDIANCE 10 MG TABS tablet TAKE 1 TABLET BY MOUTH EVERY DAY BEFORE BREAKFAST 90 tablet 3   lisinopril (ZESTRIL) 40 MG tablet Take 1 tablet (40 mg total) by mouth daily. 90 tablet 3   metoprolol tartrate (LOPRESSOR) 25 MG tablet Take 1 tablet (25 mg total) by mouth 2 (two) times daily. 180 tablet 3   Multiple Vitamins-Minerals (EYE MULTIVITAMIN) CAPS Take by mouth in the morning and at bedtime.     dicyclomine (BENTYL) 10 MG capsule Take 10 mg by mouth 4 (four) times daily. prn     omeprazole (PRILOSEC) 20 MG capsule Take 20 mg by mouth daily.     ondansetron (ZOFRAN) 4 MG tablet Take 4 mg by mouth every 8 (eight) hours as needed for nausea or vomiting.     No facility-administered medications prior to visit.   No Known Allergies Objective:   Today's Vitals   08/30/22 0923  BP: 134/70  Pulse: 74  Temp: 98.2 F (36.8 C)  TempSrc: Temporal  SpO2: 93%  Weight: 190 lb 6.4 oz (86.4 kg)  Height: '5\' 8"'$  (1.727 m)   Body mass index is 28.95 kg/m.   General: Well developed, well nourished. No acute distress. Psych: Alert and oriented. Normal mood and affect.  Health Maintenance Due  Topic Date Due   DTaP/Tdap/Td (1 - Tdap) Never done   COLONOSCOPY (Pts 45-81yr Insurance coverage will need to be confirmed)  Never done   Zoster Vaccines- Shingrix (2 of 2) 01/24/2020   OPHTHALMOLOGY EXAM  07/05/2022     Assessment & Plan:   Problem List Items Addressed This Visit       Cardiovascular and Mediastinum   Essential hypertension    BP is improved and adequately managed. Continue lisinopril 40 mg and HCTZ 25 mg daily.      Type 2 diabetes mellitus with cardiac complication ([place]    Working on weight loss. I will check an A1c today. Continue empagliflozin 10 mg daily.       Relevant Orders   Glucose, random (Completed)   Hemoglobin A1c (Completed)   CAD (coronary artery disease) - Primary    Stable. No chest pain. Continue aspirin 81 mg daily and metoprolol tartrate 25 mg bid.      Ascending aortic aneurysm ([place]    Scheduled for CTA to monitor this aneurysm.        Other   Hyperlipidemia    Continue atorvastatin 80 mg daily.      Erythrocytosis    Continue to follow with hematology.       Return in about 3 months (around 11/28/2022) for Reassessment.   SAnnie Main  Freddie Apley, MD

## 2022-08-30 NOTE — Assessment & Plan Note (Signed)
Continue to follow with hematology

## 2022-08-30 NOTE — Assessment & Plan Note (Signed)
Continue atorvastatin 80 mg daily.

## 2022-08-30 NOTE — Assessment & Plan Note (Signed)
Scheduled for CTA to monitor this aneurysm.

## 2022-08-30 NOTE — Progress Notes (Signed)
Called to room to assist during endoscopic procedure.  Patient ID and intended procedure confirmed with present staff. Received instructions for my participation in the procedure from the performing physician.  

## 2022-08-30 NOTE — Progress Notes (Signed)
[facility] History and Physical   Primary Care Physician:  Haydee Salter, MD   Reason for Procedure:   Colon cancer screening  Plan:    Screening colonoscopy     HPI: Jesus KAEMMERER Sr. is a 67 y.o. male undergoing average risk screening colonoscopy.  He has no family history of colon cancer and no chronic GI symptoms.  He reports having a colonoscopy in his early 90s that was normal.    Past Medical History:  Diagnosis Date   Cancer [facility])    SKIN   Cataract    Diabetes mellitus without complication ([place])    Essential hypertension 11/22/2019   Hyperlipidemia 03/13/2021    Past Surgical History:  Procedure Laterality Date   APPENDECTOMY     67yo   CORONARY ARTERY BYPASS GRAFT N/A 02/16/2021   Procedure: CORONARY ARTERY BYPASS GRAFTING (CABG)x 2 ON CARDIOPULMONARY BYPASS USING RIGHT GSV AND LIMA.;  Surgeon: Lajuana Matte, MD;  Location: [facility];  Service: Open Heart Surgery;  Laterality: N/A;   ENDOVEIN HARVEST OF GREATER SAPHENOUS VEIN Right 02/16/2021   Procedure: ENDOVEIN HARVEST OF GREATER SAPHENOUS VEIN;  Surgeon: Lajuana Matte, MD;  Location: [facility];  Service: Open Heart Surgery;  Laterality: Right;   MOHS SURGERY     SCC Rt arm   RIGHT/LEFT HEART CATH AND CORONARY ANGIOGRAPHY N/A 02/13/2021   Procedure: RIGHT/LEFT HEART CATH AND CORONARY ANGIOGRAPHY;  Surgeon: Belva Crome, MD;  Location: [facility];  Service: Cardiovascular;  Laterality: N/A;   TEE WITHOUT CARDIOVERSION N/A 02/16/2021   Procedure: TRANSESOPHAGEAL ECHOCARDIOGRAM (TEE);  Surgeon: Lajuana Matte, MD;  Location: [facility];  Service: Open Heart Surgery;  Laterality: N/A;   TONSILLECTOMY      Prior to Admission medications   Medication Sig Start Date End Date Taking? Authorizing Provider  aspirin 81 MG EC tablet Take 1 tablet (81 mg total) by mouth daily. Patient taking differently: Take 81 mg by mouth every evening. 04/06/21  Yes Haydee Salter, MD  atorvastatin  (LIPITOR) 80 MG tablet Take 1 tablet (80 mg total) by mouth daily. 01/12/22  Yes Haydee Salter, MD  dutasteride (AVODART) 0.5 MG capsule Take 1 capsule (0.5 mg total) by mouth daily. 05/31/22  Yes Haydee Salter, MD  hydrochlorothiazide (HYDRODIURIL) 25 MG tablet Take 1 tablet (25 mg total) by mouth daily. 10/26/21  Yes Haydee Salter, MD  JARDIANCE 10 MG TABS tablet TAKE 1 TABLET BY MOUTH EVERY DAY BEFORE BREAKFAST 03/08/22  Yes Haydee Salter, MD  lisinopril (ZESTRIL) 40 MG tablet Take 1 tablet (40 mg total) by mouth daily. 05/31/22  Yes Haydee Salter, MD  metoprolol tartrate (LOPRESSOR) 25 MG tablet Take 1 tablet (25 mg total) by mouth 2 (two) times daily. 01/12/22  Yes RuddLillette Boxer, MD  Multiple Vitamins-Minerals (EYE MULTIVITAMIN) CAPS Take by mouth in the morning and at bedtime.   Yes [provider]    Current Outpatient Medications  Medication Sig Dispense Refill   aspirin 81 MG EC tablet Take 1 tablet (81 mg total) by mouth daily. (Patient taking differently: Take 81 mg by mouth every evening.) 90 tablet 3   atorvastatin (LIPITOR) 80 MG tablet Take 1 tablet (80 mg total) by mouth daily. 90 tablet 3   dutasteride (AVODART) 0.5 MG capsule Take 1 capsule (0.5 mg total) by mouth daily. 90 capsule 1   hydrochlorothiazide (HYDRODIURIL) 25 MG tablet Take 1 tablet (25 mg total) by mouth daily.  90 tablet 3   JARDIANCE 10 MG TABS tablet TAKE 1 TABLET BY MOUTH EVERY DAY BEFORE BREAKFAST 90 tablet 3   lisinopril (ZESTRIL) 40 MG tablet Take 1 tablet (40 mg total) by mouth daily. 90 tablet 3   metoprolol tartrate (LOPRESSOR) 25 MG tablet Take 1 tablet (25 mg total) by mouth 2 (two) times daily. 180 tablet 3   Multiple Vitamins-Minerals (EYE MULTIVITAMIN) CAPS Take by mouth in the morning and at bedtime.     Current Facility-Administered Medications  Medication Dose Route Frequency Provider Last Rate Last Admin   0.9 %  sodium chloride infusion  500 mL Intravenous Once Daryel November, MD        Allergies as of 08/30/2022   (No Known Allergies)    Family History  Problem Relation Age of Onset   Arthritis Mother    Cancer Father    Hypertension Father    Diabetes Sister    Stroke Brother    Colon cancer Neg Hx    Colon polyps Neg Hx    Crohn's disease Neg Hx    Esophageal cancer Neg Hx    Rectal cancer Neg Hx    Stomach cancer Neg Hx    Ulcerative colitis Neg Hx     Social History   Socioeconomic History   Marital status: Widowed    Spouse name: Not on file   Number of children: 1   Years of education: 16   Highest education level: Bachelor's degree (e.g., BA, AB, BS)  Occupational History   Occupation: Visual merchandiser    Comment: [employer]  Tobacco Use   Smoking status: Every Day    Packs/day: 1.00    Types: Cigarettes    Passive exposure: Never   Smokeless tobacco: Never  Vaping Use   Vaping Use: Never used  Substance and Sexual Activity   Alcohol use: Yes    Comment: Occas   Drug use: Never   Sexual activity: Not Currently  Other Topics Concern   Not on file  Social History Narrative   Not on file   Social Determinants of Health   Financial Resource Strain: Low Risk  (12/02/2021)   Overall Financial Resource Strain (CARDIA)    Difficulty of Paying Living Expenses: Not hard at all  Food Insecurity: No Food Insecurity (12/02/2021)   Hunger Vital Sign    Worried About Running Out of Food in the Last Year: Never true    Ran Out of Food in the Last Year: Never true  Transportation Needs: No Transportation Needs (12/02/2021)   PRAPARE - Hydrologist (Medical): No    Lack of Transportation (Non-Medical): No  Physical Activity: Insufficiently Active (12/02/2021)   Exercise Vital Sign    Days of Exercise per Week: 3 days    Minutes of Exercise per Session: 30 min  Stress: No Stress Concern Present (12/02/2021)   [place]    Feeling  of Stress : Not at all  Social Connections: Moderately Isolated (12/02/2021)   Social Connection and Isolation Panel [NHANES]    Frequency of Communication with Friends and Family: Twice a week    Frequency of Social Gatherings with Friends and Family: Twice a week    Attends Religious Services: More than 4 times per year    Active Member of [employer] or Organizations: No    Attends Archivist Meetings: Never    Marital Status: Widowed  Intimate  Partner Violence: Not At Risk (12/02/2021)   Humiliation, Afraid, Rape, and Kick questionnaire    Fear of Current or Ex-Partner: No    Emotionally Abused: No    Physically Abused: No    Sexually Abused: No    Review of Systems:  All other review of systems negative except as mentioned in the HPI.  Physical Exam: Vital signs BP (!) 154/94   Pulse 76   Temp (!) 97.5 F (36.4 C)   Ht '5\' 8"'$  (1.727 m)   Wt 193 lb (87.5 kg)   SpO2 94%   BMI 29.35 kg/m   General:   Alert,  Well-developed, well-nourished, pleasant and cooperative in NAD Airway:  Mallampati 2 Lungs:  Clear throughout to auscultation.   Heart:  Regular rate and rhythm; no murmurs, clicks, rubs,  or gallops. Abdomen:  Soft, nontender and nondistended. Normal bowel sounds.   Neuro/Psych:  Normal mood and affect. A and O x 3   Jesus Marcucci E. Candis Schatz, MD [facility]

## 2022-08-30 NOTE — Progress Notes (Signed)
Pt's states no medical or surgical changes since previsit or office visit. 

## 2022-08-30 NOTE — Patient Instructions (Addendum)
Continue present medications. Await pathology results. Repeat colonoscopy (date not yet determined) for surveillance based on pathology results  Please read over handouts about polyps, diverticulosis and hemorrhoids                        . YOU HAD AN ENDOSCOPIC PROCEDURE TODAY AT [facility]:   Refer to the procedure report that was given to you for any specific questions about what was found during the examination.  If the procedure report does not answer your questions, please call your gastroenterologist to clarify.  If you requested that your care partner not be given the details of your procedure findings, then the procedure report has been included in a sealed envelope for you to review at your convenience later.  YOU SHOULD EXPECT: Some feelings of bloating in the abdomen. Passage of more gas than usual.  Walking can help get rid of the air that was put into your GI tract during the procedure and reduce the bloating. If you had a lower endoscopy (such as a colonoscopy or flexible sigmoidoscopy) you may notice spotting of blood in your stool or on the toilet paper. If you underwent a bowel prep for your procedure, you may not have a normal bowel movement for a few days.  Please Note:  You might notice some irritation and congestion in your nose or some drainage.  This is from the oxygen used during your procedure.  There is no need for concern and it should clear up in a day or so.  SYMPTOMS TO REPORT IMMEDIATELY:  Following lower endoscopy (colonoscopy or flexible sigmoidoscopy):  Excessive amounts of blood in the stool  Significant tenderness or worsening of abdominal pains  Swelling of the abdomen that is new, acute  Fever of 100F or higher  For urgent or emergent issues, a gastroenterologist can be reached at any hour by calling [phone]. Do not use MyChart messaging for urgent concerns.    DIET:  We do recommend a small meal at first, but then you may  proceed to your regular diet.  Drink plenty of fluids but you should avoid alcoholic beverages for 24 hours.  ACTIVITY:  You should plan to take it easy for the rest of today and you should NOT DRIVE or use heavy machinery until tomorrow (because of the sedation medicines used during the test).    FOLLOW UP: Our staff will call the number listed on your records the next business day following your procedure.  We will call around 7:15- 8:00 am to check on you and address any questions or concerns that you may have regarding the information given to you following your procedure. If we do not reach you, we will leave a message.     If any biopsies were taken you will be contacted by phone or by letter within the next 1-3 weeks.  Please call us at [phone] if you have not heard about the biopsies in 3 weeks.    SIGNATURES/CONFIDENTIALITY: You and/or your care partner have signed paperwork which will be entered into your electronic medical record.  These signatures attest to the fact that that the information above on your After Visit Summary has been reviewed and is understood.  Full responsibility of the confidentiality of this discharge information lies with you and/or your care-partner.

## 2022-08-30 NOTE — Assessment & Plan Note (Signed)
Stable. No chest pain. Continue aspirin 81 mg daily and metoprolol tartrate 25 mg bid.

## 2022-08-30 NOTE — Assessment & Plan Note (Signed)
Working on weight loss. I will check an A1c today. Continue empagliflozin 10 mg daily.

## 2022-08-30 NOTE — Op Note (Signed)
[place] Patient Name: Jesus Davenport Procedure Date: 08/30/2022 1:18 PM MRN: [id number] Endoscopist: Nicki Reaper E. Candis Davenport , MD, [id number] Age: 67 Referring MD:  Date of Birth: 1956/06/12 Gender: Male Account #: [id number] Procedure:                Colonoscopy Indications:              Screening for colorectal malignant neoplasm (last                            colonoscopy was more than 10 years ago) Medicines:                Monitored Anesthesia Care Procedure:                Pre-Anesthesia Assessment:                           - Prior to the procedure, a History and Physical                            was performed, and patient medications and                            allergies were reviewed. The patient's tolerance of                            previous anesthesia was also reviewed. The risks                            and benefits of the procedure and the sedation                            options and risks were discussed with the patient.                            All questions were answered, and informed consent                            was obtained. Prior Anticoagulants: The patient has                            taken no anticoagulant or antiplatelet agents                            except for aspirin. ASA Grade Assessment: II - A                            patient with mild systemic disease. After reviewing                            the risks and benefits, the patient was deemed in                            satisfactory condition to undergo the procedure.  After obtaining informed consent, the colonoscope                            was passed under direct vision. Throughout the                            procedure, the patient's blood pressure, pulse, and                            oxygen saturations were monitored continuously. The                            Olympus CF-HQ190L [id number]) Colonoscope was                             introduced through the anus and advanced to the the                            cecum, identified by appendiceal orifice and                            ileocecal valve. The colonoscopy was performed                            without difficulty. The patient tolerated the                            procedure well. The quality of the bowel                            preparation was adequate. The ileocecal valve,                            appendiceal orifice, and rectum were photographed.                            The bowel preparation used was SUPREP via split                            dose instruction. Scope In: 1:25:31 PM Scope Out: 1:55:53 PM Scope Withdrawal Time: 0 hours 27 minutes 7 seconds  Total Procedure Duration: 0 hours 30 minutes 22 seconds  Findings:                 The perianal and digital rectal examinations were                            normal. Pertinent negatives include normal                            sphincter tone and no palpable rectal lesions.                           Two sessile polyps were found in the cecum. The  polyps were 2 mm in size. These polyps were removed                            with a cold snare. Resection and retrieval were                            complete. Estimated blood loss was minimal.                           A 5 mm polyp was found in the ascending colon. The                            polyp was sessile. The polyp was removed with a                            cold snare. Resection and retrieval were complete.                            Estimated blood loss was minimal.                           Three sessile polyps were found in the transverse                            colon. The polyps were 3 to 6 mm in size. These                            polyps were removed with a cold snare. Resection                            and retrieval were complete. Estimated blood loss                            was minimal.                            A 4 mm polyp was found in the splenic flexure. The                            polyp was sessile. The polyp was removed with a                            cold snare. Resection and retrieval were complete.                            Estimated blood loss was minimal.                           An 11 mm polyp was found in the descending colon.                            The polyp was pedunculated. The polyp was removed  with a cold snare. Resection and retrieval were                            complete. Estimated blood loss was minimal.                           Three small angiodysplastic lesions without                            bleeding were found in the ascending colon and in                            the cecum.                           Many small-mouthed diverticula were found in the                            sigmoid colon and descending colon. There was                            narrowing of the colon in association with the                            diverticular opening. Erythema was seen in                            association with the diverticular opening. There                            was evidence of an impacted diverticulum.                           The exam was otherwise normal throughout the                            examined colon.                           The retroflexed view of the distal rectum and anal                            verge was normal and showed no anal or rectal                            abnormalities. Complications:            No immediate complications. Estimated Blood Loss:     Estimated blood loss was minimal. Impression:               - Two 2 mm polyps in the cecum, removed with a cold                            snare. Resected and retrieved.                           -  One 5 mm polyp in the ascending colon, removed                            with a cold snare. Resected and retrieved.                            - Three 3 to 6 mm polyps in the transverse colon,                            removed with a cold snare. Resected and retrieved.                           - One 4 mm polyp at the splenic flexure, removed                            with a cold snare. Resected and retrieved.                           - One 11 mm polyp in the descending colon, removed                            with a cold snare. Resected and retrieved.                           - Three non-bleeding colonic angiodysplastic                            lesions.                           - Moderate diverticulosis in the sigmoid colon and                            in the descending colon. There was narrowing of the                            colon in association with the diverticular opening.                            Erythema was seen in association with the                            diverticular opening. There was evidence of an                            impacted diverticulum.                           - The distal rectum and anal verge are normal on                            retroflexion view. Recommendation:           - Patient has a contact number available for  emergencies. The signs and symptoms of potential                            delayed complications were discussed with the                            patient. Return to normal activities tomorrow.                            Written discharge instructions were provided to the                            patient.                           - Resume previous diet.                           - Continue present medications.                           - Await pathology results.                           - Repeat colonoscopy (date not yet determined) for                            surveillance based on pathology results. Jesus Antenucci E. Candis Schatz, MD 08/30/2022 2:09:04 PM This report has been signed electronically.

## 2022-08-31 ENCOUNTER — Telehealth: Payer: Self-pay

## 2022-08-31 NOTE — Telephone Encounter (Signed)
Left message on answering machine. 

## 2022-09-06 NOTE — Progress Notes (Signed)
Jesus Davenport,   All eight polyps that I removed during your recent procedure were completely benign but were proven to be "pre-cancerous" polyps that MAY have grown into cancers if they had not been removed.  Studies shows that at least 20% of women over age 68 and 30% of men over age 41 have pre-cancerous polyps.  Based on current nationally recognized surveillance guidelines, I recommend that you have a repeat colonoscopy in 3 years.   If you develop any new rectal bleeding, abdominal pain or significant bowel habit changes, please contact me before then.

## 2022-09-15 NOTE — Progress Notes (Signed)
     [address]       [place] [address]             [phone]     PCP is Rudd, Lillette Boxer, MD Referring Provider is Gena Fray Lillette Boxer, MD  Chief Complaint: Ascending thoracic aortic aneurysm    Patient: Home Provider: Office Consent for Telemedicine visit obtained.   Today's visit was completed via a real-time telehealth (see specific modality noted below). The patient/authorized person provided oral consent at the time of the visit to engage in a telemedicine encounter with the present provider at [facility]. The patient/authorized person was informed of the potential benefits, limitations, and risks of telemedicine. The patient/authorized person expressed understanding that the laws that protect confidentiality also apply to telemedicine. The patient/authorized person acknowledged understanding that telemedicine does not provide emergency services and that he or she would need to call 911 or proceed to the nearest hospital for help if such a need arose.   Total time spent in the clinical discussion 10 minutes.  Telehealth Modality: Phone visit (audio only)      I had a telephone visit with Jesus Davenport.  He is status post CABG in 2022. The patient was initially diagnosed with an aortic aneurysm incidentally found on a lung cancer screening CT scan performed in 2021. TCTS had been following his 4.5 cm ascending thoracic aortic aneurysm since then.  Of note, he has moved to [facility], but all of his physicians are in [place]. We can follow-up virtually to discuss the results of yearly CT scans. He will undergo another CT scan of the chest and February 2025. He did/did not follow up with PCP regarding BP control. We discussed the following: Risk Modification in those with ascending thoracic aortic aneurysm:  Continue good control of blood pressure (prefer SBP 130/80 or less)-continue Lisinopril and Lopressor  2. Avoid fluoroquinolone antibiotics  (I.e Ciprofloxacin, Avelox, Levofloxacin, Ofloxacin)  3.  Use of statin (to decrease cardiovascular risk)-continue Atorvastatin  4.  Exercise and activity limitations is individualized, but in general, contact sports are to be avoided and one should avoid heavy lifting (defined as half of ideal body weight) and exercises involving sustained Valsalva maneuver.  5. Counseling for those suspected of having genetically mediated disease. First-degree relatives of those with TAA disease should be screened as well as those who have a connective tissue disease (I.e with Marfan syndrome, Ehlers-Danlos syndrome, and Loeys-Dietz syndrome) or a  bicuspid aortic valve,have an increased risk for  complications related to TAA. He has no family history of above. Echo done in August 2022 showed aortic valve to be tricuspid, mild aortic insufficiency, and moderate dilatation of the ascending aorta 45 mm  6. He has tobacco abuse, smoking cessation was highly encouraged.          He states he has occasional chest tightness, does not recall if occurs with exertion or at rest. He was instructed if this persists, increases or is associated with diaphoresis, neck/jaw pain, shortness of breath etc, he is to seek immediate evaluation. 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 We will arrange for another CTA in 6 months for further surveillance of ATAA.     Nani Skillern, PA-C [facility] [phone]

## 2022-09-21 ENCOUNTER — Other Ambulatory Visit: Payer: Self-pay | Admitting: Family Medicine

## 2022-09-21 DIAGNOSIS — I10 Essential (primary) hypertension: Secondary | ICD-10-CM

## 2022-09-22 ENCOUNTER — Ambulatory Visit (INDEPENDENT_AMBULATORY_CARE_PROVIDER_SITE_OTHER): Payer: Medicare Other | Admitting: Physician Assistant

## 2022-09-22 ENCOUNTER — Encounter: Payer: Self-pay | Admitting: Physician Assistant

## 2022-09-22 ENCOUNTER — Ambulatory Visit
Admission: RE | Admit: 2022-09-22 | Discharge: 2022-09-22 | Disposition: A | Discharge status: Home / Self Care | Payer: Medicare Other | Source: Ambulatory Visit | Attending: Thoracic Surgery (Cardiothoracic Vascular Surgery) | Admitting: Thoracic Surgery (Cardiothoracic Vascular Surgery)

## 2022-09-22 DIAGNOSIS — I712 Thoracic aortic aneurysm, without rupture, unspecified: Secondary | ICD-10-CM

## 2022-09-22 DIAGNOSIS — I7121 Aneurysm of the ascending aorta, without rupture: Secondary | ICD-10-CM | POA: Diagnosis not present

## 2022-09-22 MED ORDER — IOPAMIDOL (ISOVUE-370) INJECTION 76%
75.0000 mL | Freq: Once | INTRAVENOUS | Status: AC | PRN
  Administered 2022-09-22: 75 mL via INTRAVENOUS

## 2022-11-22 ENCOUNTER — Other Ambulatory Visit: Payer: Self-pay | Admitting: Family Medicine

## 2022-11-22 DIAGNOSIS — N400 Enlarged prostate without lower urinary tract symptoms: Secondary | ICD-10-CM

## 2022-12-06 ENCOUNTER — Ambulatory Visit (INDEPENDENT_AMBULATORY_CARE_PROVIDER_SITE_OTHER): Payer: Medicare Other

## 2022-12-06 VITALS — Ht 70.0 in | Wt 194.0 lb

## 2022-12-06 DIAGNOSIS — Z0000 Encounter for general adult medical examination without abnormal findings: Secondary | ICD-10-CM

## 2022-12-06 NOTE — Patient Instructions (Signed)
[name] , Thank you for taking time to come for your Medicare Wellness Visit. I appreciate your ongoing commitment to your health goals. Please review the following plan we discussed and let me know if I can assist you in the future.   These are the goals we discussed:  Goals   None     This is a list of the screening recommended for you and due dates:  Health Maintenance  Topic Date Due   DTaP/Tdap/Td vaccine (1 - Tdap) Never done   Zoster (Shingles) Vaccine (2 of 2) 01/24/2020   COVID-19 Vaccine (5 - 2023-24 season) 03/05/2022   Eye exam for diabetics  07/05/2022   Flu Shot  02/03/2023   Hemoglobin A1C  02/28/2023   Yearly kidney function blood test for diabetes  06/01/2023   Yearly kidney health urinalysis for diabetes  06/01/2023   Complete foot exam   06/01/2023   Medicare Annual Wellness Visit  12/06/2023   Colon Cancer Screening  08/30/2025   Pneumonia Vaccine  Completed   Hepatitis C Screening  Completed   HPV Vaccine  Aged Out    Advanced directives: Please bring a copy of your POA (Power of [place]) and/or Living Will to your next appointment.   Conditions/risks identified: smoking  Next appointment: Follow up in one year for your annual wellness visit.   Preventive Care 25 Years and Older, Male  Preventive care refers to lifestyle choices and visits with your health care provider that can promote health and wellness. What does preventive care include? A yearly physical exam. This is also called an annual well check. Dental exams once or twice a year. Routine eye exams. Ask your health care provider how often you should have your eyes checked. Personal lifestyle choices, including: Daily care of your teeth and gums. Regular physical activity. Eating a healthy diet. Avoiding tobacco and drug use. Limiting alcohol use. Practicing safe sex. Taking low doses of aspirin every day. Taking vitamin and mineral supplements as recommended by your health care  provider. What happens during an annual well check? The services and screenings done by your health care provider during your annual well check will depend on your age, overall health, lifestyle risk factors, and family history of disease. Counseling  Your health care provider may ask you questions about your: Alcohol use. Tobacco use. Drug use. Emotional well-being. Home and relationship well-being. Sexual activity. Eating habits. History of falls. Memory and ability to understand (cognition). Work and work Astronomer. Screening  You may have the following tests or measurements: Height, weight, and BMI. Blood pressure. Lipid and cholesterol levels. These may be checked every 5 years, or more frequently if you are over 59 years old. Skin check. Lung cancer screening. You may have this screening every year starting at age 14 if you have a 30-pack-year history of smoking and currently smoke or have quit within the past 15 years. Fecal occult blood test (FOBT) of the stool. You may have this test every year starting at age 71. Flexible sigmoidoscopy or colonoscopy. You may have a sigmoidoscopy every 5 years or a colonoscopy every 10 years starting at age 53. Prostate cancer screening. Recommendations will vary depending on your family history and other risks. Hepatitis C blood test. Hepatitis B blood test. Sexually transmitted disease (STD) testing. Diabetes screening. This is done by checking your blood sugar (glucose) after you have not eaten for a while (fasting). You may have this done every 1-3 years. Abdominal aortic aneurysm (AAA) screening.  You may need this if you are a current or former smoker. Osteoporosis. You may be screened starting at age 39 if you are at high risk. Talk with your health care provider about your test results, treatment options, and if necessary, the need for more tests. Vaccines  Your health care provider may recommend certain vaccines, such  as: Influenza vaccine. This is recommended every year. Tetanus, diphtheria, and acellular pertussis (Tdap, Td) vaccine. You may need a Td booster every 10 years. Zoster vaccine. You may need this after age 44. Pneumococcal 13-valent conjugate (PCV13) vaccine. One dose is recommended after age 55. Pneumococcal polysaccharide (PPSV23) vaccine. One dose is recommended after age 72. Talk to your health care provider about which screenings and vaccines you need and how often you need them. This information is not intended to replace advice given to you by your health care provider. Make sure you discuss any questions you have with your health care provider. Document Released: 07/18/2015 Document Revised: 03/10/2016 Document Reviewed: 04/22/2015 Elsevier Interactive Patient Education  2017 [employer].  Fall Prevention in the Home Falls can cause injuries. They can happen to people of all ages. There are many things you can do to make your home safe and to help prevent falls. What can I do on the outside of my home? Regularly fix the edges of walkways and driveways and fix any cracks. Remove anything that might make you trip as you walk through a door, such as a raised step or threshold. Trim any bushes or trees on the path to your home. Use bright outdoor lighting. Clear any walking paths of anything that might make someone trip, such as rocks or tools. Regularly check to see if handrails are loose or broken. Make sure that both sides of any steps have handrails. Any raised decks and porches should have guardrails on the edges. Have any leaves, snow, or ice cleared regularly. Use sand or salt on walking paths during winter. Clean up any spills in your garage right away. This includes oil or grease spills. What can I do in the bathroom? Use night lights. Install grab bars by the toilet and in the tub and shower. Do not use towel bars as grab bars. Use non-skid mats or decals in the tub or  shower. If you need to sit down in the shower, use a plastic, non-slip stool. Keep the floor dry. Clean up any water that spills on the floor as soon as it happens. Remove soap buildup in the tub or shower regularly. Attach bath mats securely with double-sided non-slip rug tape. Do not have throw rugs and other things on the floor that can make you trip. What can I do in the bedroom? Use night lights. Make sure that you have a light by your bed that is easy to reach. Do not use any sheets or blankets that are too big for your bed. They should not hang down onto the floor. Have a firm chair that has side arms. You can use this for support while you get dressed. Do not have throw rugs and other things on the floor that can make you trip. What can I do in the kitchen? Clean up any spills right away. Avoid walking on wet floors. Keep items that you use a lot in easy-to-reach places. If you need to reach something above you, use a strong step stool that has a grab bar. Keep electrical cords out of the way. Do not use floor polish or wax  that makes floors slippery. If you must use wax, use non-skid floor wax. Do not have throw rugs and other things on the floor that can make you trip. What can I do with my stairs? Do not leave any items on the stairs. Make sure that there are handrails on both sides of the stairs and use them. Fix handrails that are broken or loose. Make sure that handrails are as long as the stairways. Check any carpeting to make sure that it is firmly attached to the stairs. Fix any carpet that is loose or worn. Avoid having throw rugs at the top or bottom of the stairs. If you do have throw rugs, attach them to the floor with carpet tape. Make sure that you have a light switch at the top of the stairs and the bottom of the stairs. If you do not have them, ask someone to add them for you. What else can I do to help prevent falls? Wear shoes that: Do not have high heels. Have  rubber bottoms. Are comfortable and fit you well. Are closed at the toe. Do not wear sandals. If you use a stepladder: Make sure that it is fully opened. Do not climb a closed stepladder. Make sure that both sides of the stepladder are locked into place. Ask someone to hold it for you, if possible. Clearly mark and make sure that you can see: Any grab bars or handrails. First and last steps. Where the edge of each step is. Use tools that help you move around (mobility aids) if they are needed. These include: Canes. Walkers. Scooters. Crutches. Turn on the lights when you go into a dark area. Replace any light bulbs as soon as they burn out. Set up your furniture so you have a clear path. Avoid moving your furniture around. If any of your floors are uneven, fix them. If there are any pets around you, be aware of where they are. Review your medicines with your doctor. Some medicines can make you feel dizzy. This can increase your chance of falling. Ask your doctor what other things that you can do to help prevent falls. This information is not intended to replace advice given to you by your health care provider. Make sure you discuss any questions you have with your health care provider. Document Released: 04/17/2009 Document Revised: 11/27/2015 Document Reviewed: 07/26/2014 Elsevier Interactive Patient Education  2017 [employer].

## 2022-12-06 NOTE — Progress Notes (Signed)
I connected with  Jesus AMBURN Sr. on 12/06/22 by a audio enabled telemedicine application and verified that I am speaking with the correct person using two identifiers.  Patient Location: Home  Provider Location: Office/Clinic  I discussed the limitations of evaluation and management by telemedicine. The patient expressed understanding and agreed to proceed. Subjective:   Jesus BARADA Sr. is a 67 y.o. male who presents for Medicare Annual/Subsequent preventive examination.  Review of Systems     Cardiac Risk Factors include: advanced age (>41men, >18 women);diabetes mellitus;dyslipidemia;hypertension;male gender;smoking/ tobacco exposure     Objective:    Today's Vitals   12/06/22 1358  Weight: 194 lb (88 kg)  Height: 5\' 10"  (1.778 m)   Body mass index is 27.84 kg/m.     12/06/2022    2:03 PM 12/02/2021    8:22 AM 02/13/2021    1:20 PM 02/13/2021   10:19 AM  Advanced Directives  Does Patient Have a Medical Advance Directive? Yes Yes Yes Yes  Type of Estate agent of [place];Living will Healthcare Power of [place];Living will Healthcare Power of [place];Living will Living will;Healthcare Power of Attorney  Does patient want to make changes to medical advance directive?   No - Patient declined No - Patient declined  Copy of Healthcare Power of Attorney in Chart? No - copy requested No - copy requested No - copy requested No - copy requested    Current Medications (verified) Outpatient Encounter Medications as of 12/06/2022  Medication Sig   aspirin 81 MG EC tablet Take 1 tablet (81 mg total) by mouth daily. (Patient taking differently: Take 81 mg by mouth every evening.)   atorvastatin (LIPITOR) 80 MG tablet Take 1 tablet (80 mg total) by mouth daily.   dutasteride (AVODART) 0.5 MG capsule TAKE 1 CAPSULE BY MOUTH EVERY DAY   hydrochlorothiazide (HYDRODIURIL) 25 MG tablet TAKE 1 TABLET BY MOUTH EVERY DAY   JARDIANCE 10 MG TABS tablet TAKE 1 TABLET  BY MOUTH EVERY DAY BEFORE BREAKFAST   lisinopril (ZESTRIL) 40 MG tablet Take 1 tablet (40 mg total) by mouth daily.   metoprolol tartrate (LOPRESSOR) 25 MG tablet Take 1 tablet (25 mg total) by mouth 2 (two) times daily.   Multiple Vitamins-Minerals (EYE MULTIVITAMIN) CAPS Take by mouth in the morning and at bedtime.   No facility-administered encounter medications on file as of 12/06/2022.    Allergies (verified) Patient has no known allergies.   History: Past Medical History:  Diagnosis Date   Cancer (HCC)    SKIN   Cataract    Diabetes mellitus without complication (HCC)    Essential hypertension 11/22/2019   Hyperlipidemia 03/13/2021   Past Surgical History:  Procedure Laterality Date   APPENDECTOMY     67yo   CORONARY ARTERY BYPASS GRAFT N/A 02/16/2021   Procedure: CORONARY ARTERY BYPASS GRAFTING (CABG)x 2 ON CARDIOPULMONARY BYPASS USING RIGHT GSV AND LIMA.;  Surgeon: Corliss Skains, MD;  Location: [facility] OR;  Service: Open Heart Surgery;  Laterality: N/A;   ENDOVEIN HARVEST OF GREATER SAPHENOUS VEIN Right 02/16/2021   Procedure: ENDOVEIN HARVEST OF GREATER SAPHENOUS VEIN;  Surgeon: Corliss Skains, MD;  Location: [facility] OR;  Service: Open Heart Surgery;  Laterality: Right;   MOHS SURGERY     SCC Rt arm   RIGHT/LEFT HEART CATH AND CORONARY ANGIOGRAPHY N/A 02/13/2021   Procedure: RIGHT/LEFT HEART CATH AND CORONARY ANGIOGRAPHY;  Surgeon: Lyn Records, MD;  Location: [facility];  Service: Cardiovascular;  Laterality:  N/A;   TEE WITHOUT CARDIOVERSION N/A 02/16/2021   Procedure: TRANSESOPHAGEAL ECHOCARDIOGRAM (TEE);  Surgeon: Corliss Skains, MD;  Location: [facility];  Service: Open Heart Surgery;  Laterality: N/A;   TONSILLECTOMY     Family History  Problem Relation Age of Onset   Arthritis Mother    Cancer Father    Hypertension Father    Diabetes Sister    Stroke Brother    Colon cancer Neg Hx    Colon polyps Neg Hx    Crohn's disease Neg Hx    Esophageal  cancer Neg Hx    Rectal cancer Neg Hx    Stomach cancer Neg Hx    Ulcerative colitis Neg Hx    Social History   Socioeconomic History   Marital status: Widowed    Spouse name: Not on file   Number of children: 1   Years of education: 16   Highest education level: Bachelor's degree (e.g., BA, AB, BS)  Occupational History   Occupation: Holiday representative    Comment: [employer].  Tobacco Use   Smoking status: Every Day    Packs/day: .5    Types: Cigarettes    Passive exposure: Never   Smokeless tobacco: Never  Vaping Use   Vaping Use: Never used  Substance and Sexual Activity   Alcohol use: Yes    Comment: Occas   Drug use: Never   Sexual activity: Not Currently  Other Topics Concern   Not on file  Social History Narrative   Not on file   Social Determinants of Health   Financial Resource Strain: Low Risk  (12/06/2022)   Overall Financial Resource Strain (CARDIA)    Difficulty of Paying Living Expenses: Not hard at all  Food Insecurity: No Food Insecurity (12/06/2022)   Hunger Vital Sign    Worried About Running Out of Food in the Last Year: Never true    Ran Out of Food in the Last Year: Never true  Transportation Needs: No Transportation Needs (12/06/2022)   PRAPARE - Administrator, Civil Service (Medical): No    Lack of Transportation (Non-Medical): No  Physical Activity: Insufficiently Active (12/06/2022)   Exercise Vital Sign    Days of Exercise per Week: 7 days    Minutes of Exercise per Session: 20 min  Stress: No Stress Concern Present (12/06/2022)   [employer] of Occupational Health - Occupational Stress Questionnaire    Feeling of Stress : Not at all  Social Connections: Moderately Isolated (12/02/2021)   Social Connection and Isolation Panel [NHANES]    Frequency of Communication with Friends and Family: Twice a week    Frequency of Social Gatherings with Friends and Family: Twice a week    Attends Religious Services: More than 4 times  per year    Active Member of [employer] or Organizations: No    Attends Banker Meetings: Never    Marital Status: Widowed    Tobacco Counseling Ready to quit: Yes Counseling given: Not Answered   Clinical Intake:  Pre-visit preparation completed: Yes  Pain : No/denies pain     Nutritional Status: BMI 25 -29 Overweight Nutritional Risks: None Diabetes: Yes  How often do you need to have someone help you when you read instructions, pamphlets, or other written materials from your doctor or pharmacy?: 1 - Never  Diabetic? Yes Nutrition Risk Assessment:  Has the patient had any N/V/D within the last 2 months?  No  Does the patient have any non-healing  wounds?  No  Has the patient had any unintentional weight loss or weight gain?  No   Diabetes:  Is the patient diabetic?  Yes  If diabetic, was a CBG obtained today?  No  Did the patient bring in their glucometer from home?  No  How often do you monitor your CBG's? Does not.   Financial Strains and Diabetes Management:  Are you having any financial strains with the device, your supplies or your medication? No .  Does the patient want to be seen by Chronic Care Management for management of their diabetes?  No  Would the patient like to be referred to a Nutritionist or for Diabetic Management?  No   Diabetic Exams:  Diabetic Eye Exam: Overdue for diabetic eye exam. Pt has been advised about the importance in completing this exam. Patient advised to call and schedule an eye exam. Diabetic Foot Exam: Completed 05/31/2022  Interpreter Needed?: No  Information entered by :: NAllen LPN   Activities of Daily Living    12/06/2022    2:07 PM  In your present state of health, do you have any difficulty performing the following activities:  Hearing? 0  Vision? 0  Difficulty concentrating or making decisions? 0  Walking or climbing stairs? 1  Dressing or bathing? 0  Doing errands, shopping? 0  Preparing Food and  eating ? N  Using the Toilet? N  In the past six months, have you accidently leaked urine? Y  Do you have problems with loss of bowel control? N  Managing your Medications? N  Managing your Finances? N  Housekeeping or managing your Housekeeping? N    Patient Care Team: Loyola Mast, MD as PCP - General (Family Medicine) Thomasene Ripple, DO as PCP - Cardiology (Cardiology) Thomasene Ripple, DO as Consulting Physician (Cardiology) Mateo Flow, MD as Consulting Physician (Ophthalmology) Bettey Costa, MD as Consulting Physician (Dermatology) Corliss Skains, MD as Consulting Physician (Cardiothoracic Surgery) Herderson, [place], DO as Consulting Physician (Hematology)  Indicate any recent Medical Services you may have received from other than [facility] providers in the past year (date may be approximate).     Assessment:   This is a routine wellness examination for Dejean.  Hearing/Vision screen Vision Screening - Comments:: No regular eye exams  Dietary issues and exercise activities discussed: Current Exercise Habits: Home exercise routine, Type of exercise: walking, Time (Minutes): 20, Frequency (Times/Week): 7, Weekly Exercise (Minutes/Week): 140   Goals Addressed   None    Depression Screen    12/06/2022    2:06 PM 08/30/2022    9:31 AM 12/02/2021    8:24 AM 12/02/2021    8:21 AM 10/26/2021    9:18 AM 07/08/2021   11:44 AM 04/30/2021   10:19 AM  PHQ 2/9 Scores  PHQ - 2 Score 0 0 0 0 0 0 0    Fall Risk    12/05/2022    9:15 PM 08/30/2022    9:31 AM 12/02/2021    8:23 AM 04/30/2021   12:04 PM 01/08/2021    9:34 AM  Fall Risk   Falls in the past year? 0 0 0 0 0  Number falls in past yr: 0 0 0 0 0  Injury with Fall? 0 0 0 0 0  Comment    Fell on ice. Skinned knee. No Dr. visit   Risk for fall due to : Medication side effect No Fall Risks  No Fall Risks   Follow up Falls prevention discussed;Education  provided;Falls evaluation completed Falls evaluation completed Falls  evaluation completed Falls evaluation completed     FALL RISK PREVENTION PERTAINING TO THE HOME:  Any stairs in or around the home? Yes  If so, are there any without handrails? No  Home free of loose throw rugs in walkways, pet beds, electrical cords, etc? Yes  Adequate lighting in your home to reduce risk of falls? Yes   ASSISTIVE DEVICES UTILIZED TO PREVENT FALLS:  Life alert? No  Use of a cane, walker or w/c? No  Grab bars in the bathroom? Yes  Shower chair or bench in shower? No  Elevated toilet seat or a handicapped toilet? Yes   TIMED UP AND GO:  Was the test performed? No .      Cognitive Function:        12/06/2022    2:08 PM  6CIT Screen  What Year? 0 points  What month? 0 points  What time? 0 points  Count back from 20 0 points  Months in reverse 0 points  Repeat phrase 0 points  Total Score 0 points    Immunizations Immunization History  Administered Date(s) Administered   Fluad Quad(high Dose 65+) 04/06/2021   Moderna Sars-Covid-2 Vaccination 09/26/2019, 10/25/2019, 06/03/2020, 04/07/2021   Pneumococcal Conjugate-13 01/23/2021   Pneumococcal Polysaccharide-23 02/26/2022   Zoster Recombinat (Shingrix) 11/29/2019    TDAP status: Due, Education has been provided regarding the importance of this vaccine. Advised may receive this vaccine at local pharmacy or Health Dept. Aware to provide a copy of the vaccination record if obtained from local pharmacy or Health Dept. Verbalized acceptance and understanding.  Flu Vaccine status: Up to date  Pneumococcal vaccine status: Up to date  Covid-19 vaccine status: Completed vaccines  Qualifies for Shingles Vaccine? Yes   Zostavax completed No   Shingrix Completed?: Yes  Screening Tests Health Maintenance  Topic Date Due   DTaP/Tdap/Td (1 - Tdap) Never done   Zoster Vaccines- Shingrix (2 of 2) 01/24/2020   COVID-19 Vaccine (5 - 2023-24 season) 03/05/2022   OPHTHALMOLOGY EXAM  07/05/2022   Medicare  Annual Wellness (AWV)  12/03/2022   INFLUENZA VACCINE  02/03/2023   HEMOGLOBIN A1C  02/28/2023   Diabetic kidney evaluation - eGFR measurement  06/01/2023   Diabetic kidney evaluation - Urine ACR  06/01/2023   FOOT EXAM  06/01/2023   Colonoscopy  08/30/2025   Pneumonia Vaccine 67+ Years old  Completed   Hepatitis C Screening  Completed   HPV VACCINES  Aged Out    Health Maintenance  Health Maintenance Due  Topic Date Due   DTaP/Tdap/Td (1 - Tdap) Never done   Zoster Vaccines- Shingrix (2 of 2) 01/24/2020   COVID-19 Vaccine (5 - 2023-24 season) 03/05/2022   OPHTHALMOLOGY EXAM  07/05/2022   Medicare Annual Wellness (AWV)  12/03/2022    Colorectal cancer screening: Type of screening: Colonoscopy. Completed 08/30/2022. Repeat every 5 years  Lung Cancer Screening: (Low Dose CT Chest recommended if Age 73-80 years, 30 pack-year currently smoking OR have quit w/in 15years.) does not qualify.   Lung Cancer Screening Referral: no  Additional Screening:  Hepatitis C Screening: does qualify; Completed 10/26/2021  Vision Screening: Recommended annual ophthalmology exams for early detection of glaucoma and other disorders of the eye. Is the patient up to date with their annual eye exam?  No  Who is the provider or what is the name of the office in which the patient attends annual eye exams? none If pt is not established  with a provider, would they like to be referred to a provider to establish care? No .   Dental Screening: Recommended annual dental exams for proper oral hygiene  Community Resource Referral / Chronic Care Management: CRR required this visit?  No   CCM required this visit?  No      Plan:     I have personally reviewed and noted the following in the patient's chart:   Medical and social history Use of alcohol, tobacco or illicit drugs  Current medications and supplements including opioid prescriptions. Patient is not currently taking opioid  prescriptions. Functional ability and status Nutritional status Physical activity Advanced directives List of other physicians Hospitalizations, surgeries, and ER visits in previous 12 months Vitals Screenings to include cognitive, depression, and falls Referrals and appointments  In addition, I have reviewed and discussed with patient certain preventive protocols, quality metrics, and best practice recommendations. A written personalized care plan for preventive services as well as general preventive health recommendations were provided to patient.     Barb Merino, LPN   07/10/1094   Nurse Notes: none  Due to this being a virtual visit, the after visit summary with patients personalized plan was offered to patient via mail or my-chart. Patient would like to access on my-chart

## 2022-12-13 ENCOUNTER — Encounter: Payer: Self-pay | Admitting: Family Medicine

## 2022-12-13 ENCOUNTER — Ambulatory Visit (INDEPENDENT_AMBULATORY_CARE_PROVIDER_SITE_OTHER): Payer: Medicare Other | Admitting: Family Medicine

## 2022-12-13 VITALS — BP 138/76 | HR 87 | Temp 98.2°F | Ht 70.0 in | Wt 190.2 lb

## 2022-12-13 DIAGNOSIS — I10 Essential (primary) hypertension: Secondary | ICD-10-CM

## 2022-12-13 DIAGNOSIS — H353210 Exudative age-related macular degeneration, right eye, stage unspecified: Secondary | ICD-10-CM

## 2022-12-13 DIAGNOSIS — E782 Mixed hyperlipidemia: Secondary | ICD-10-CM | POA: Diagnosis not present

## 2022-12-13 DIAGNOSIS — D751 Secondary polycythemia: Secondary | ICD-10-CM

## 2022-12-13 DIAGNOSIS — I7121 Aneurysm of the ascending aorta, without rupture: Secondary | ICD-10-CM

## 2022-12-13 DIAGNOSIS — F17200 Nicotine dependence, unspecified, uncomplicated: Secondary | ICD-10-CM

## 2022-12-13 DIAGNOSIS — E1159 Type 2 diabetes mellitus with other circulatory complications: Secondary | ICD-10-CM

## 2022-12-13 DIAGNOSIS — E1169 Type 2 diabetes mellitus with other specified complication: Secondary | ICD-10-CM | POA: Diagnosis not present

## 2022-12-13 DIAGNOSIS — E785 Hyperlipidemia, unspecified: Secondary | ICD-10-CM

## 2022-12-13 DIAGNOSIS — I2510 Atherosclerotic heart disease of native coronary artery without angina pectoris: Secondary | ICD-10-CM | POA: Diagnosis not present

## 2022-12-13 DIAGNOSIS — Z7984 Long term (current) use of oral hypoglycemic drugs: Secondary | ICD-10-CM

## 2022-12-13 LAB — HEMOGLOBIN A1C: Hgb A1c MFr Bld: 6.9 % — ABNORMAL HIGH (ref 4.6–6.5)

## 2022-12-13 LAB — GLUCOSE, RANDOM: Glucose, Bld: 125 mg/dL — ABNORMAL HIGH (ref 70–99)

## 2022-12-13 MED ORDER — METOPROLOL TARTRATE 25 MG PO TABS: 25.0000 mg | ORAL_TABLET | Freq: Two times a day (BID) | ORAL | 3 refills | Status: AC

## 2022-12-13 MED ORDER — ATORVASTATIN CALCIUM 80 MG PO TABS: 80.0000 mg | ORAL_TABLET | Freq: Every day | ORAL | 3 refills | Status: AC

## 2022-12-13 MED ORDER — EMPAGLIFLOZIN 10 MG PO TABS: ORAL_TABLET | ORAL | 3 refills | Status: AC

## 2022-12-13 NOTE — Assessment & Plan Note (Signed)
Stable.       - Continue to monitor

## 2022-12-13 NOTE — Assessment & Plan Note (Signed)
Stable. No chest pain. Continue aspirin 81 mg daily and metoprolol tartrate 25 mg bid. 

## 2022-12-13 NOTE — Assessment & Plan Note (Signed)
BP is improved and adequately managed. Continue lisinopril 40 mg and HCTZ 25 mg daily. 

## 2022-12-13 NOTE — Assessment & Plan Note (Signed)
I continue to recommend smoking cessation. 

## 2022-12-13 NOTE — Assessment & Plan Note (Signed)
Urge him to follow-up with ophthalmology, despite his issues with a past doctor.

## 2022-12-13 NOTE — Assessment & Plan Note (Signed)
Has been at goal. I will check an A1c today. Continue empagliflozin 10 mg daily.

## 2022-12-13 NOTE — Assessment & Plan Note (Signed)
Continue to follow with hematology

## 2022-12-13 NOTE — Assessment & Plan Note (Signed)
Continue atorvastatin 80 mg daily 

## 2022-12-13 NOTE — Progress Notes (Signed)
[facility] [address] [place] [address] Dept: [phone] Dept Fax: [phone]  Chronic Care Office Visit  Subjective:    Patient ID: Jesus SALMONS Sr., male    DOB: 10-Jun-1956, 67 y.o..   MRN: [id number]  Chief Complaint  Patient presents with   Medical Management of Chronic Issues    3 month f/u.  Fasting today.     History of Present Illness:  Patient is in today for reassessment of chronic medical issues.  Mr. Moschella was found to have erythrocytosis this winter, likely secondary to smoking. He has had a few therapeutic phlebotomies. His Hct is now staying < 55%.   Mr. Pinneo has a history of CAD and underwent a 2-vessel CABG in 02/2021. He had some associated CHF. He was also noted to have a 4.5 cm dilated ascending aortic aneurysm. He is working with Dr. Servando Salina (cardiology) and Dr. Cliffton Asters (CT surgery). His is on aspirin 81 mg daily and metoprolol tartrate 25 mg bid for his heart. He continues to smoke about 1/2 ppd. 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   Mr. Buffkin has hypertension, managed on lisinopril 40 mg and HCTZ 25 mg daily.    Mr. Zlotnick has hyperlipidemia. This is managed with atorvastatin 80 mg daily.   Mr. Bruinsma has a history of Type 2 diabetes. He is currently managed on Jardiance 10 mg daily.   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   CAD (coronary artery  disease) 02/13/2021   Type 2 diabetes mellitus with cardiac complication (HCC) 01/26/2021   Essential hypertension 11/22/2019   BPH with elevated PSA 11/22/2019   Squamous cell carcinoma of arm, 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   Cervical arthritis 11/22/2019   DDD (degenerative disc disease), lumbar 11/22/2019   Past Surgical History:  Procedure Laterality Date   APPENDECTOMY     67yo   CORONARY ARTERY BYPASS GRAFT N/A 02/16/2021   Procedure: CORONARY ARTERY BYPASS GRAFTING (CABG)x 2 ON CARDIOPULMONARY BYPASS USING RIGHT GSV AND LIMA.;  Surgeon: Corliss Skains, MD;  Location: [facility] OR;  Service: Open Heart Surgery;  Laterality: N/A;   ENDOVEIN HARVEST OF GREATER SAPHENOUS VEIN Right 02/16/2021   Procedure: ENDOVEIN HARVEST OF GREATER SAPHENOUS VEIN;  Surgeon: Corliss Skains, MD;  Location: [facility] OR;  Service: Open Heart Surgery;  Laterality: Right;   MOHS SURGERY     SCC Rt arm   RIGHT/LEFT HEART CATH AND CORONARY ANGIOGRAPHY N/A 02/13/2021   Procedure: RIGHT/LEFT HEART CATH AND CORONARY ANGIOGRAPHY;  Surgeon: Lyn Records, MD;  Location: [facility];  Service: Cardiovascular;  Laterality: N/A;   TEE WITHOUT CARDIOVERSION N/A 02/16/2021   Procedure: TRANSESOPHAGEAL ECHOCARDIOGRAM (TEE);  Surgeon: Corliss Skains, MD;  Location: [facility] OR;  Service: Open Heart Surgery;  Laterality: N/A;   TONSILLECTOMY     Family History  Problem Relation Age of Onset   Arthritis Mother    Cancer Father  Hypertension Father    Diabetes Sister    Stroke Brother    Colon cancer Neg Hx    Colon polyps Neg Hx    Crohn's disease Neg Hx    Esophageal cancer Neg Hx    Rectal cancer Neg Hx    Stomach cancer Neg Hx    Ulcerative colitis Neg Hx    Outpatient Medications Prior to Visit  Medication Sig Dispense Refill   aspirin 81 MG EC tablet Take 1 tablet (81 mg total) by mouth daily. (Patient taking differently: Take 81 mg by mouth every evening.) 90 tablet 3   dutasteride (AVODART) 0.5 MG capsule TAKE  1 CAPSULE BY MOUTH EVERY DAY 90 capsule 3   hydrochlorothiazide (HYDRODIURIL) 25 MG tablet TAKE 1 TABLET BY MOUTH EVERY DAY 90 tablet 3   lisinopril (ZESTRIL) 40 MG tablet Take 1 tablet (40 mg total) by mouth daily. 90 tablet 3   Multiple Vitamins-Minerals (EYE MULTIVITAMIN) CAPS Take by mouth in the morning and at bedtime.     atorvastatin (LIPITOR) 80 MG tablet Take 1 tablet (80 mg total) by mouth daily. 90 tablet 3   JARDIANCE 10 MG TABS tablet TAKE 1 TABLET BY MOUTH EVERY DAY BEFORE BREAKFAST 90 tablet 3   metoprolol tartrate (LOPRESSOR) 25 MG tablet Take 1 tablet (25 mg total) by mouth 2 (two) times daily. 180 tablet 3   No facility-administered medications prior to visit.   No Known Allergies Objective:   Today's Vitals   12/13/22 0751  BP: 138/76  Pulse: 87  Temp: 98.2 F (36.8 C)  TempSrc: Temporal  SpO2: 93%  Weight: 190 lb 3.2 oz (86.3 kg)  Height: 5\' 10"  (1.778 m)   Body mass index is 27.29 kg/m.   General: Well developed, well nourished. No acute distress. Psych: Alert and oriented. Normal mood and affect.  Health Maintenance Due  Topic Date Due   DTaP/Tdap/Td (1 - Tdap) Never done   Zoster Vaccines- Shingrix (2 of 2) 01/24/2020   OPHTHALMOLOGY EXAM  07/05/2022     Imaging: CT Angio Chest and Aorta w, wo contrast (09/22/2022) IMPRESSION: Unchanged TAA, maximum diameter 4.6 cm. Otherwise no acute cardiopulmonary process.  Assessment & Plan:   Problem List Items Addressed This Visit       Cardiovascular and Mediastinum   Essential hypertension    BP is improved and adequately managed. Continue lisinopril 40 mg and HCTZ 25 mg daily.      Relevant Medications   atorvastatin (LIPITOR) 80 MG tablet   metoprolol tartrate (LOPRESSOR) 25 MG tablet   Type 2 diabetes mellitus with cardiac complication (HCC) - Primary    Has been at goal. I will check an A1c today. Continue empagliflozin 10 mg daily.      Relevant Medications   atorvastatin (LIPITOR) 80 MG  tablet   empagliflozin (JARDIANCE) 10 MG TABS tablet   metoprolol tartrate (LOPRESSOR) 25 MG tablet   CAD (coronary artery disease)    Stable. No chest pain. Continue aspirin 81 mg daily and metoprolol tartrate 25 mg bid.      Relevant Medications   atorvastatin (LIPITOR) 80 MG tablet   metoprolol tartrate (LOPRESSOR) 25 MG tablet   Ascending aortic aneurysm (HCC)    Stable. Continue to monitor.       Relevant Medications   atorvastatin (LIPITOR) 80 MG tablet   metoprolol tartrate (LOPRESSOR) 25 MG tablet     Other   Hyperlipidemia    Continue atorvastatin 80 mg daily.  Relevant Medications   atorvastatin (LIPITOR) 80 MG tablet   metoprolol tartrate (LOPRESSOR) 25 MG tablet   Tobacco use disorder    I continue to recommend smoking cessation.       Macular degeneration, wet- OD (HCC)    Urge him to follow-up with ophthalmology, despite his issues with a past doctor.      Erythrocytosis    Continue to follow with hematology.      Other Visit Diagnoses     DM type 2 with diabetic mixed hyperlipidemia (HCC)       Relevant Medications   atorvastatin (LIPITOR) 80 MG tablet   empagliflozin (JARDIANCE) 10 MG TABS tablet   metoprolol tartrate (LOPRESSOR) 25 MG tablet   Other Relevant Orders   Glucose, random   Hemoglobin A1c       Return in about 3 months (around 03/15/2023) for Reassessment.   Loyola Mast, MD

## 2023-02-14 ENCOUNTER — Other Ambulatory Visit: Payer: Self-pay | Admitting: Thoracic Surgery (Cardiothoracic Vascular Surgery)

## 2023-02-14 DIAGNOSIS — I7121 Aneurysm of the ascending aorta, without rupture: Secondary | ICD-10-CM

## 2023-02-17 ENCOUNTER — Encounter (INDEPENDENT_AMBULATORY_CARE_PROVIDER_SITE_OTHER): Payer: Self-pay

## 2023-03-15 ENCOUNTER — Ambulatory Visit: Payer: Medicare Other | Admitting: Family Medicine

## 2023-03-21 ENCOUNTER — Ambulatory Visit (INDEPENDENT_AMBULATORY_CARE_PROVIDER_SITE_OTHER): Payer: Medicare Other | Admitting: Family Medicine

## 2023-03-21 ENCOUNTER — Encounter: Payer: Self-pay | Admitting: Family Medicine

## 2023-03-21 VITALS — BP 138/82 | HR 74 | Temp 97.9°F | Ht 70.0 in | Wt 194.6 lb

## 2023-03-21 DIAGNOSIS — I10 Essential (primary) hypertension: Secondary | ICD-10-CM

## 2023-03-21 DIAGNOSIS — I7121 Aneurysm of the ascending aorta, without rupture: Secondary | ICD-10-CM

## 2023-03-21 DIAGNOSIS — I2510 Atherosclerotic heart disease of native coronary artery without angina pectoris: Secondary | ICD-10-CM

## 2023-03-21 DIAGNOSIS — E1159 Type 2 diabetes mellitus with other circulatory complications: Secondary | ICD-10-CM

## 2023-03-21 DIAGNOSIS — D751 Secondary polycythemia: Secondary | ICD-10-CM | POA: Diagnosis not present

## 2023-03-21 DIAGNOSIS — E782 Mixed hyperlipidemia: Secondary | ICD-10-CM

## 2023-03-21 DIAGNOSIS — Z7984 Long term (current) use of oral hypoglycemic drugs: Secondary | ICD-10-CM

## 2023-03-21 DIAGNOSIS — F17200 Nicotine dependence, unspecified, uncomplicated: Secondary | ICD-10-CM

## 2023-03-21 LAB — GLUCOSE, RANDOM: Glucose, Bld: 103 mg/dL — ABNORMAL HIGH (ref 70–99)

## 2023-03-21 LAB — HEMOGLOBIN A1C: Hgb A1c MFr Bld: 7 % — ABNORMAL HIGH (ref 4.6–6.5)

## 2023-03-21 NOTE — Assessment & Plan Note (Signed)
Stable. Scheduled for CT monitoring of his aneurysm tomorrow. I recommend he plan to establish with a cardiothoracic surgeon near [facility] for ongoing monitoring of his aneurysm.

## 2023-03-21 NOTE — Assessment & Plan Note (Signed)
Stable. No chest pain. Continue aspirin 81 mg daily and metoprolol tartrate 25 mg bid. I recommend he establish with a cardiologist near [facility].

## 2023-03-21 NOTE — Progress Notes (Signed)
[facility] [address] [place] [address] Dept: [phone] Dept Fax: [phone]  Chronic Care Office Visit  Subjective:    Patient ID: Jesus PATA Sr., male    DOB: 06-25-1956, 67 y.o..   MRN: [id number]  Chief Complaint  Patient presents with   Hypertension    3 month f/u.     History of Present Illness:  Patient is in today for reassessment of chronic medical issues.  Mr. Delprado was found to have erythrocytosis last winter, likely secondary to smoking. He had an additional therapeutic phlebotomy since his last visit.   Mr. Chowning has a history of CAD and underwent a 2-vessel CABG in 02/2021. He had some associated CHF. He was also noted to have a 4.5 cm dilated ascending aortic aneurysm. He is working with Dr. Servando Salina (cardiology) and Dr. Cliffton Asters (CT surgery). His is on aspirin 81 mg daily and metoprolol tartrate 25 mg bid for his heart. He continues to smoke about 1/2 ppd, though he is making efforts to quit.    Mr. Mccadden has hypertension, managed on lisinopril 40 mg and HCTZ 25 mg daily.    Mr. Chavanne has hyperlipidemia. This is managed with atorvastatin 80 mg daily.   Mr. Seagrave has a history of Type 2 diabetes. He is currently managed on Jardiance 10 mg daily.   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   CAD (coronary artery disease) 02/13/2021   Type 2 diabetes mellitus with cardiac complication (HCC) 01/26/2021   Essential  hypertension 11/22/2019   BPH with elevated PSA 11/22/2019   Squamous cell carcinoma of arm, 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   Cervical arthritis 11/22/2019   DDD (degenerative disc disease), lumbar 11/22/2019   Past Surgical History:  Procedure Laterality Date   APPENDECTOMY     67yo   CORONARY ARTERY BYPASS GRAFT N/A 02/16/2021   Procedure: CORONARY ARTERY BYPASS GRAFTING (CABG)x 2 ON CARDIOPULMONARY BYPASS USING RIGHT GSV AND LIMA.;  Surgeon: Corliss Skains, MD;  Location: [facility] OR;  Service: Open Heart Surgery;  Laterality: N/A;   ENDOVEIN HARVEST OF GREATER SAPHENOUS VEIN Right 02/16/2021   Procedure: ENDOVEIN HARVEST OF GREATER SAPHENOUS VEIN;  Surgeon: Corliss Skains, MD;  Location: [facility] OR;  Service: Open Heart Surgery;  Laterality: Right;   MOHS SURGERY     SCC Rt arm   RIGHT/LEFT HEART CATH AND CORONARY ANGIOGRAPHY N/A 02/13/2021   Procedure: RIGHT/LEFT HEART CATH AND CORONARY ANGIOGRAPHY;  Surgeon: Lyn Records, MD;  Location: [facility] INVASIVE CV LAB;  Service: Cardiovascular;  Laterality: N/A;   TEE WITHOUT CARDIOVERSION N/A 02/16/2021   Procedure: TRANSESOPHAGEAL ECHOCARDIOGRAM (TEE);  Surgeon: Corliss Skains, MD;  Location: [facility] OR;  Service: Open Heart Surgery;  Laterality: N/A;   TONSILLECTOMY     Family History  Problem Relation Age of Onset   Arthritis Mother    Cancer Father    Hypertension Father    Diabetes Sister    Stroke Brother    Colon cancer Neg Hx  Colon polyps Neg Hx    Crohn's disease Neg Hx    Esophageal cancer Neg Hx    Rectal cancer Neg Hx    Stomach cancer Neg Hx    Ulcerative colitis Neg Hx    Outpatient Medications Prior to Visit  Medication Sig Dispense Refill   aspirin 81 MG EC tablet Take 1 tablet (81 mg total) by mouth daily. (Patient taking differently: Take 81 mg by mouth every evening.) 90 tablet 3   atorvastatin (LIPITOR) 80 MG tablet Take 1 tablet (80 mg total) by mouth daily. 90 tablet 3   dutasteride (AVODART) 0.5 MG capsule TAKE 1  CAPSULE BY MOUTH EVERY DAY 90 capsule 3   empagliflozin (JARDIANCE) 10 MG TABS tablet TAKE 1 TABLET BY MOUTH EVERY DAY BEFORE BREAKFAST 90 tablet 3   hydrochlorothiazide (HYDRODIURIL) 25 MG tablet TAKE 1 TABLET BY MOUTH EVERY DAY 90 tablet 3   lisinopril (ZESTRIL) 40 MG tablet Take 1 tablet (40 mg total) by mouth daily. 90 tablet 3   metoprolol tartrate (LOPRESSOR) 25 MG tablet Take 1 tablet (25 mg total) by mouth 2 (two) times daily. 180 tablet 3   Multiple Vitamins-Minerals (EYE MULTIVITAMIN) CAPS Take by mouth in the morning and at bedtime.     No facility-administered medications prior to visit.   No Known Allergies Objective:   Today's Vitals   03/21/23 0757 03/21/23 0834  BP: (!) 160/88 138/82  Pulse: 74   Temp: 97.9 F (36.6 C)   TempSrc: Temporal   SpO2: 96%   Weight: 194 lb 9.6 oz (88.3 kg)   Height: 5\' 10"  (1.778 m)    Body mass index is 27.92 kg/m.   General: Well developed, well nourished. No acute distress. Psych: Alert and oriented. Normal mood and affect.  Health Maintenance Due  Topic Date Due   DTaP/Tdap/Td (1 - Tdap) Never done   Zoster Vaccines- Shingrix (2 of 2) 01/24/2020   OPHTHALMOLOGY EXAM  07/05/2022   INFLUENZA VACCINE  02/03/2023     Assessment & Plan:   Problem List Items Addressed This Visit       Cardiovascular and Mediastinum   Ascending aortic aneurysm (HCC)    Stable. Scheduled for CT monitoring of his aneurysm tomorrow. I recommend he plan to establish with a cardiothoracic surgeon near [facility] for ongoing monitoring of his aneurysm.      CAD (coronary artery disease) - Primary    Stable. No chest pain. Continue aspirin 81 mg daily and metoprolol tartrate 25 mg bid. I recommend he establish with a cardiologist near [facility].      Essential hypertension    BP is elevated today, though improved with resting for a few minutes. Continue lisinopril 40 mg and HCTZ 25 mg daily. He has a pending appointment to establish with a new  PCP. If this remains elevated, he may need addition of amlodipine.      Type 2 diabetes mellitus with cardiac complication (HCC)    Has been at goal. I will check an A1c today. Continue empagliflozin 10 mg daily.      Relevant Orders   Glucose, random   Hemoglobin A1c     Other   Erythrocytosis    Continue to follow with hematology.      Hyperlipidemia    Has been at goal. Continue atorvastatin 80 mg daily.      Tobacco use disorder    I continue to recommend smoking cessation. I support his current efforts to quit.  Return for Follow-up as scheduled with new PCP.   Loyola Mast, MD

## 2023-03-21 NOTE — Assessment & Plan Note (Signed)
Has been at goal. I will check an A1c today. Continue empagliflozin 10 mg daily.

## 2023-03-21 NOTE — Assessment & Plan Note (Signed)
BP is elevated today, though improved with resting for a few minutes. Continue lisinopril 40 mg and HCTZ 25 mg daily. He has a pending appointment to establish with a new PCP. If this remains elevated, he may need addition of amlodipine.

## 2023-03-21 NOTE — Assessment & Plan Note (Signed)
I continue to recommend smoking cessation. I support his current efforts to quit.

## 2023-03-21 NOTE — Assessment & Plan Note (Signed)
Continue to follow with hematology

## 2023-03-21 NOTE — Assessment & Plan Note (Signed)
Has been at goal. Continue atorvastatin 80 mg daily.

## 2023-03-22 ENCOUNTER — Ambulatory Visit: Payer: Medicare Other

## 2023-03-22 ENCOUNTER — Ambulatory Visit
Admission: RE | Admit: 2023-03-22 | Discharge: 2023-03-22 | Disposition: A | Discharge status: Home / Self Care | Payer: Medicare Other | Source: Ambulatory Visit | Attending: Thoracic Surgery (Cardiothoracic Vascular Surgery) | Admitting: Thoracic Surgery (Cardiothoracic Vascular Surgery)

## 2023-03-22 DIAGNOSIS — I7121 Aneurysm of the ascending aorta, without rupture: Secondary | ICD-10-CM

## 2023-03-22 MED ORDER — IOPAMIDOL (ISOVUE-370) INJECTION 76%
500.0000 mL | Freq: Once | INTRAVENOUS | Status: AC | PRN
  Administered 2023-03-22: 75 mL via INTRAVENOUS

## 2023-03-22 NOTE — Progress Notes (Deleted)
      [address]       [place] [address]             [phone]       HPI:  [name] is a 67 year old gentleman with a past history notable for nonobstructive left carotid stenosis, hypertension, type 2 diabetes mellitus, and coronary artery disease.  He status post CABG x 2 in 2002.  During the course of that admission, he was discovered to have a 4.5 cm thoracic aortic aneurysm.  He has been seen and surveillance twice yearly since that time. [name] currently resides in [place] but he continues to see his physicians with whom he had establish care before his move.  He reports***   Current Outpatient Medications  Medication Sig Dispense Refill   aspirin 81 MG EC tablet Take 1 tablet (81 mg total) by mouth daily. (Patient taking differently: Take 81 mg by mouth every evening.) 90 tablet 3   atorvastatin (LIPITOR) 80 MG tablet Take 1 tablet (80 mg total) by mouth daily. 90 tablet 3   dutasteride (AVODART) 0.5 MG capsule TAKE 1 CAPSULE BY MOUTH EVERY DAY 90 capsule 3   empagliflozin (JARDIANCE) 10 MG TABS tablet TAKE 1 TABLET BY MOUTH EVERY DAY BEFORE BREAKFAST 90 tablet 3   hydrochlorothiazide (HYDRODIURIL) 25 MG tablet TAKE 1 TABLET BY MOUTH EVERY DAY 90 tablet 3   lisinopril (ZESTRIL) 40 MG tablet Take 1 tablet (40 mg total) by mouth daily. 90 tablet 3   metoprolol tartrate (LOPRESSOR) 25 MG tablet Take 1 tablet (25 mg total) by mouth 2 (two) times daily. 180 tablet 3   Multiple Vitamins-Minerals (EYE MULTIVITAMIN) CAPS Take by mouth in the morning and at bedtime.     No current facility-administered medications for this visit.    Physical Exam  Diagnostic Tests:   Impression / Plan:   We discussed the importance of ongoing surveillance either with our practice or with a thoracic surgery practice in [facility].  We discussed minimizing risks factors including tobacco use, careful blood pressure control, and continuing with management  of his dyslipidemia.  He should continue to avoid strenuous activity with no lifting greater than 50% of his body weight.  Recommend follow-up with CTA chest in 6 months.  Leary Roca, PA-C [facility] [phone]

## 2023-03-23 ENCOUNTER — Encounter: Payer: Self-pay | Admitting: Thoracic Surgery (Cardiothoracic Vascular Surgery)

## 2023-04-12 ENCOUNTER — Ambulatory Visit (INDEPENDENT_AMBULATORY_CARE_PROVIDER_SITE_OTHER): Payer: Medicare Other | Admitting: Physician Assistant

## 2023-04-12 VITALS — BP 167/106 | HR 81 | Resp 18 | Ht 70.0 in | Wt 193.0 lb

## 2023-04-12 DIAGNOSIS — I7121 Aneurysm of the ascending aorta, without rupture: Secondary | ICD-10-CM | POA: Diagnosis not present

## 2023-04-12 MED ORDER — AMLODIPINE BESYLATE 5 MG PO TABS: 5.0000 mg | ORAL_TABLET | Freq: Every day | ORAL | 1 refills | Status: AC

## 2023-04-12 NOTE — Progress Notes (Signed)
[address]       [place] [address]             [phone]        Jesus GAETANI Sr. [id number] 03-01-1956  History of Present Illness: Jesus Davenport. Jesus Davenport is a 67 year old gentleman with a past history notable for nonobstructive left carotid stenosis, hypertension, type 2 diabetes mellitus, and coronary artery disease.  He is status post CABG x 2 by Dr. Cliffton Asters in 2022.  During the course of that admission, he was discovered to have a 4.5 cm thoracic aortic aneurysm.   He denies chest pain, dizziness, and LOC. He does admit to some chest tightness and SOB with exertion. He is still smoking about 1/2 pack of cigarettes per day. He moved to [facility] about 1 year and a half ago and is in the process of getting a new cardiologist, PCP, and CT surgeon closer to his home.   Current Outpatient Medications on File Prior to Visit  Medication Sig Dispense Refill   aspirin 81 MG EC tablet Take 1 tablet (81 mg total) by mouth daily. (Patient taking differently: Take 81 mg by mouth every evening.) 90 tablet 3   atorvastatin (LIPITOR) 80 MG tablet Take 1 tablet (80 mg total) by mouth daily. 90 tablet 3   dutasteride (AVODART) 0.5 MG capsule TAKE 1 CAPSULE BY MOUTH EVERY DAY 90 capsule 3   empagliflozin (JARDIANCE) 10 MG TABS tablet TAKE 1 TABLET BY MOUTH EVERY DAY BEFORE BREAKFAST 90 tablet 3   hydrochlorothiazide (HYDRODIURIL) 25 MG tablet TAKE 1 TABLET BY MOUTH EVERY DAY 90 tablet 3   lisinopril (ZESTRIL) 40 MG tablet Take 1 tablet (40 mg total) by mouth daily. 90 tablet 3   metoprolol tartrate (LOPRESSOR) 25 MG tablet Take 1 tablet (25 mg total) by mouth 2 (two) times daily. 180 tablet 3   Multiple Vitamins-Minerals (EYE MULTIVITAMIN) CAPS Take by mouth in the morning and at bedtime.     No current facility-administered medications on file prior to visit.    Today's Vitals   04/12/23 1503 04/12/23 1509  BP: (!) 183/110 (!) 167/106  Pulse: 81   Resp: 18    SpO2: 95%   Weight: 193 lb (87.5 kg)   Height: 5\' 10"  (1.778 m)    Body mass index is 27.69 kg/m.  BP: 156/95 at the end of the visit  Physical Exam General: Alert and oriented, no acute distress Neuro: Grossly intact CV: Regular rate and rhythm, no murmur Pulm: Clear to auscultation bilaterally GI: +BS, nontender Extremities: No edema, 2+ radial pulses bilaterally  CTA Results:  CLINICAL DATA:  Ascending thoracic aortic aneurysm   EXAM: CT ANGIOGRAPHY CHEST WITH CONTRAST   TECHNIQUE: Multidetector CT imaging of the chest was performed using the standard protocol during bolus administration of intravenous contrast. Multiplanar CT image reconstructions and MIPs were obtained to evaluate the vascular anatomy.   RADIATION DOSE REDUCTION: This exam was performed according to the departmental dose-optimization program which includes automated exposure control, adjustment of the mA and/or kV according to patient size and/or use of iterative reconstruction technique.   CONTRAST:  75mL ISOVUE-370 IOPAMIDOL (ISOVUE-370) INJECTION 76%   COMPARISON:  09/22/2022   FINDINGS: Cardiovascular: Stable 4.6 cm ascending thoracic aortic aneurysm. No evidence of dissection. Stable atherosclerosis of the thoracic aorta.   The heart is unremarkable without pericardial effusion. Stable coronary artery atherosclerosis.   Mediastinum/Nodes: No enlarged mediastinal, hilar, or axillary lymph nodes.  Thyroid gland, trachea, and esophagus demonstrate no significant findings.   Lungs/Pleura: No acute airspace disease, effusion, or pneumothorax. Scattered areas of parenchymal lung scarring are again noted, most pronounced within the left upper lobe, stable. Central airways are patent.   Upper Abdomen: Small calcified gallstone without evidence of acute cholecystitis. No other acute upper abdominal finding.   Musculoskeletal: No acute or destructive bony abnormalities. Reconstructed  images demonstrate no additional findings.   Review of the MIP images confirms the above findings.   IMPRESSION: 1. Stable 4.6 cm ascending thoracic aortic aneurysm. No evidence of dissection. Ascending thoracic aortic aneurysm. Recommend semi-annual imaging followup by CTA or MRA and referral to cardiothoracic surgery if not already obtained. This recommendation follows 2010 ACCF/AHA/AATS/ACR/ASA/SCA/SCAI/SIR/STS/SVM Guidelines for the Diagnosis and Management of Patients With Thoracic Aortic Disease. Circulation. 2010; 121: B147-W295. Aortic aneurysm NOS (ICD10-I71.9) 2. No acute intrathoracic process. 3. Aortic Atherosclerosis (ICD10-I70.0). Coronary artery atherosclerosis. 4. Incidental cholelithiasis.     Electronically Signed   By: Sharlet Salina M.D.   On: 03/22/2023 16:21   Impression and Plan:  Thoracic aortic aneurysm: Jesus Davenport presents to the clinic with a stable 4.6cm ascending aortic aneurysm. 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 We discussed the natural history and and risk factors for growth of ascending aortic aneurysms. We covered the importance of smoking cessation, tight blood pressure control, refraining from lifting heavy objects, and avoiding fluoroquinolones. The patient is aware of signs and symptoms of aortic dissection and when to present to the emergency department.  We will continue surveillance and a repeat CTA was ordered for 6 months. Discussed with the patient to call and let us know if he is able to arrange an appointment with a CT surgeon in [facility].   HTN: The patient is hypertensive in the clinic today and it only slightly decreased by the end of his appointment. He was also hypertensive at his PCP appointment last month. His PCP recommended adding Amlodipine if he remains hypertensive. I am starting him on Amlodipine 5mg  daily. He follows up with a new PCP in [place] next month and is working  on arranging a new cardiologist as well. We again reviewed the importance of blood pressure control and he agreed to keeping a BP log to show his new PCP.   Risk Modification:  Statin:  Atorvastatin  Smoking cessation instruction/counseling given: smoking about 1/2 ppd  counseled patient on the dangers of tobacco use, advised patient to stop smoking, and reviewed strategies to maximize success  Patient was counseled on importance of Blood Pressure Control.  Despite Medical intervention if the patient notices persistently elevated blood pressure readings.  They are instructed to contact their Primary Care Physician  Please avoid use of Fluoroquinolones as this can potentially increase your risk of Aortic Rupture and/or Dissection  Patient educated on signs and symptoms of Aortic Dissection, handout also provided in AVS  Jenny Reichmann, PA-C 04/12/23

## 2023-04-12 NOTE — Patient Instructions (Signed)
Risk Modification in those with ascending thoracic aortic aneurysm:  Continue good control of blood pressure (prefer SBP 130/80 or less)  2. Avoid fluoroquinolone antibiotics (I.e Ciprofloxacin, Avelox, Levofloxacin, Ofloxacin)  3.  Use of statin (to decrease cardiovascular risk)  4.  Exercise and activity limitations is individualized, but in general, contact sports are to be  avoided and one should avoid heavy lifting (defined as half of ideal body weight) and exercises involving sustained Valsalva maneuver.  5. Counseling for those suspected of having genetically mediated disease. First-degree relatives of those with TAA disease should be screened as well as those who have a connective tissue disease (I.e with Marfan syndrome, Ehlers-Danlos syndrome,  and Loeys-Dietz syndrome) or a  bicuspid aortic valve,have an increased risk for  complications related to TAA  6. If one has tobacco abuse, smoking cessation is highly encouraged.  

## 2023-05-05 ENCOUNTER — Other Ambulatory Visit: Payer: Self-pay | Admitting: Physician Assistant

## 2023-05-09 ENCOUNTER — Other Ambulatory Visit: Payer: Self-pay | Admitting: Family Medicine

## 2023-05-09 DIAGNOSIS — I10 Essential (primary) hypertension: Secondary | ICD-10-CM

## 2023-06-05 ENCOUNTER — Other Ambulatory Visit: Payer: Self-pay | Admitting: Physician Assistant

## 2023-09-06 ENCOUNTER — Telehealth: Payer: Self-pay | Admitting: Family Medicine

## 2023-09-06 NOTE — Telephone Encounter (Signed)
 Spoke with patient to schedule AWV Patient stated he moved to [facility]

## 2023-09-09 ENCOUNTER — Other Ambulatory Visit: Payer: Self-pay | Admitting: Thoracic Surgery (Cardiothoracic Vascular Surgery)

## 2023-09-09 DIAGNOSIS — I7121 Aneurysm of the ascending aorta, without rupture: Secondary | ICD-10-CM

## 2023-09-10 ENCOUNTER — Other Ambulatory Visit: Payer: Self-pay | Admitting: Family Medicine

## 2023-09-10 DIAGNOSIS — I10 Essential (primary) hypertension: Secondary | ICD-10-CM

## 2023-09-12 NOTE — Telephone Encounter (Signed)
 Lft VM for patient regarding this RX for him to get intouch with his new PCP in [facility]. Dm/cma

## 2023-10-18 ENCOUNTER — Ambulatory Visit

## 2023-12-07 ENCOUNTER — Other Ambulatory Visit: Payer: Self-pay | Admitting: Family Medicine

## 2023-12-07 DIAGNOSIS — E785 Hyperlipidemia, unspecified: Secondary | ICD-10-CM

## 2023-12-07 DIAGNOSIS — I2510 Atherosclerotic heart disease of native coronary artery without angina pectoris: Secondary | ICD-10-CM

## 2023-12-07 DIAGNOSIS — N400 Enlarged prostate without lower urinary tract symptoms: Secondary | ICD-10-CM

## 2023-12-07 DIAGNOSIS — E1169 Type 2 diabetes mellitus with other specified complication: Secondary | ICD-10-CM
